# Patient Record
Sex: Female | Born: 1955 | Race: White | Hispanic: No | Marital: Married | Smoking: Never smoker
Health system: Southern US, Community
[De-identification: ages and names within clinical notes are randomized; demographics above are authoritative.]

## PROBLEM LIST (undated history)

## (undated) DIAGNOSIS — Z801 Family history of malignant neoplasm of trachea, bronchus and lung: Secondary | ICD-10-CM

## (undated) DIAGNOSIS — Z8042 Family history of malignant neoplasm of prostate: Secondary | ICD-10-CM

## (undated) DIAGNOSIS — Z923 Personal history of irradiation: Secondary | ICD-10-CM

## (undated) DIAGNOSIS — Z9221 Personal history of antineoplastic chemotherapy: Secondary | ICD-10-CM

## (undated) DIAGNOSIS — Z808 Family history of malignant neoplasm of other organs or systems: Secondary | ICD-10-CM

## (undated) HISTORY — DX: Family history of malignant neoplasm of trachea, bronchus and lung: Z80.1

## (undated) HISTORY — DX: Family history of malignant neoplasm of other organs or systems: Z80.8

## (undated) HISTORY — PX: BLADDER SURGERY: SHX569

## (undated) HISTORY — DX: Family history of malignant neoplasm of prostate: Z80.42

## (undated) HISTORY — PX: ABDOMINAL HYSTERECTOMY: SHX81

## (undated) HISTORY — PX: SHOULDER SURGERY: SHX246

---

## 2002-09-03 ENCOUNTER — Other Ambulatory Visit: Admission: RE | Admit: 2002-09-03 | Discharge: 2002-09-03 | Payer: Self-pay | Admitting: Obstetrics & Gynecology

## 2002-12-16 ENCOUNTER — Other Ambulatory Visit: Admission: RE | Admit: 2002-12-16 | Discharge: 2002-12-16 | Payer: Self-pay | Admitting: Obstetrics & Gynecology

## 2003-08-22 DIAGNOSIS — C50919 Malignant neoplasm of unspecified site of unspecified female breast: Secondary | ICD-10-CM

## 2003-08-22 HISTORY — PX: BREAST LUMPECTOMY: SHX2

## 2003-08-22 HISTORY — DX: Malignant neoplasm of unspecified site of unspecified female breast: C50.919

## 2003-11-18 ENCOUNTER — Encounter (INDEPENDENT_AMBULATORY_CARE_PROVIDER_SITE_OTHER): Payer: Self-pay | Admitting: *Deleted

## 2003-11-18 ENCOUNTER — Encounter: Admission: RE | Admit: 2003-11-18 | Discharge: 2003-11-18 | Payer: Self-pay | Admitting: Obstetrics & Gynecology

## 2003-11-25 ENCOUNTER — Encounter (HOSPITAL_COMMUNITY): Admission: RE | Admit: 2003-11-25 | Discharge: 2004-02-23 | Payer: Self-pay | Admitting: *Deleted

## 2003-12-08 ENCOUNTER — Encounter (INDEPENDENT_AMBULATORY_CARE_PROVIDER_SITE_OTHER): Payer: Self-pay | Admitting: *Deleted

## 2003-12-08 ENCOUNTER — Ambulatory Visit (HOSPITAL_COMMUNITY): Admission: RE | Admit: 2003-12-08 | Discharge: 2003-12-08 | Payer: Self-pay | Admitting: *Deleted

## 2003-12-08 ENCOUNTER — Ambulatory Visit (HOSPITAL_BASED_OUTPATIENT_CLINIC_OR_DEPARTMENT_OTHER): Admission: RE | Admit: 2003-12-08 | Discharge: 2003-12-08 | Payer: Self-pay | Admitting: *Deleted

## 2003-12-25 ENCOUNTER — Ambulatory Visit (HOSPITAL_COMMUNITY): Admission: RE | Admit: 2003-12-25 | Discharge: 2003-12-25 | Payer: Self-pay | Admitting: Oncology

## 2004-01-08 ENCOUNTER — Other Ambulatory Visit: Admission: RE | Admit: 2004-01-08 | Discharge: 2004-01-08 | Payer: Self-pay | Admitting: Obstetrics & Gynecology

## 2004-02-09 ENCOUNTER — Ambulatory Visit: Admission: RE | Admit: 2004-02-09 | Discharge: 2004-05-05 | Payer: Self-pay | Admitting: Radiation Oncology

## 2004-02-15 ENCOUNTER — Encounter: Admission: RE | Admit: 2004-02-15 | Discharge: 2004-02-15 | Payer: Self-pay | Admitting: Radiation Oncology

## 2004-05-31 ENCOUNTER — Ambulatory Visit: Admission: RE | Admit: 2004-05-31 | Discharge: 2004-05-31 | Payer: Self-pay | Admitting: Radiation Oncology

## 2004-07-02 ENCOUNTER — Ambulatory Visit: Payer: Self-pay | Admitting: Oncology

## 2004-07-04 ENCOUNTER — Other Ambulatory Visit: Admission: RE | Admit: 2004-07-04 | Discharge: 2004-07-04 | Payer: Self-pay | Admitting: Obstetrics & Gynecology

## 2004-10-18 ENCOUNTER — Ambulatory Visit: Payer: Self-pay | Admitting: Oncology

## 2004-10-19 ENCOUNTER — Ambulatory Visit: Payer: Self-pay | Admitting: Oncology

## 2004-12-19 ENCOUNTER — Other Ambulatory Visit: Admission: RE | Admit: 2004-12-19 | Discharge: 2004-12-19 | Payer: Self-pay | Admitting: Obstetrics & Gynecology

## 2004-12-29 ENCOUNTER — Ambulatory Visit: Payer: Self-pay | Admitting: Oncology

## 2005-02-15 ENCOUNTER — Encounter: Admission: RE | Admit: 2005-02-15 | Discharge: 2005-02-15 | Payer: Self-pay | Admitting: Obstetrics & Gynecology

## 2005-04-07 ENCOUNTER — Ambulatory Visit: Payer: Self-pay | Admitting: Oncology

## 2005-07-03 ENCOUNTER — Other Ambulatory Visit: Admission: RE | Admit: 2005-07-03 | Discharge: 2005-07-03 | Payer: Self-pay | Admitting: Obstetrics & Gynecology

## 2005-08-04 ENCOUNTER — Ambulatory Visit: Payer: Self-pay | Admitting: Hematology and Oncology

## 2005-11-28 ENCOUNTER — Ambulatory Visit: Payer: Self-pay | Admitting: Oncology

## 2005-11-29 LAB — CBC WITH DIFFERENTIAL/PLATELET
BASO%: 0.6 % (ref 0.0–2.0)
Basophils Absolute: 0 10*3/uL (ref 0.0–0.1)
EOS%: 1.6 % (ref 0.0–7.0)
Eosinophils Absolute: 0.1 10*3/uL (ref 0.0–0.5)
HCT: 36.9 % (ref 34.8–46.6)
HGB: 12.9 g/dL (ref 11.6–15.9)
LYMPH%: 29.3 % (ref 14.0–48.0)
MCH: 29.1 pg (ref 26.0–34.0)
MCHC: 34.8 g/dL (ref 32.0–36.0)
MCV: 83.5 fL (ref 81.0–101.0)
MONO#: 0.5 10*3/uL (ref 0.1–0.9)
MONO%: 8.7 % (ref 0.0–13.0)
NEUT#: 3.3 10*3/uL (ref 1.5–6.5)
NEUT%: 59.8 % (ref 39.6–76.8)
Platelets: 231 10*3/uL (ref 145–400)
RBC: 4.42 10*6/uL (ref 3.70–5.32)
RDW: 13.1 % (ref 11.3–14.5)
WBC: 5.4 10*3/uL (ref 3.9–10.0)
lymph#: 1.6 10*3/uL (ref 0.9–3.3)

## 2005-11-29 LAB — COMPREHENSIVE METABOLIC PANEL
ALT: 17 U/L (ref 0–40)
AST: 20 U/L (ref 0–37)
Albumin: 4.6 g/dL (ref 3.5–5.2)
Alkaline Phosphatase: 60 U/L (ref 39–117)
BUN: 10 mg/dL (ref 6–23)
CO2: 25 mEq/L (ref 19–32)
Calcium: 9.2 mg/dL (ref 8.4–10.5)
Chloride: 105 mEq/L (ref 96–112)
Creatinine, Ser: 0.8 mg/dL (ref 0.4–1.2)
Glucose, Bld: 122 mg/dL — ABNORMAL HIGH (ref 70–99)
Potassium: 3.9 mEq/L (ref 3.5–5.3)
Sodium: 139 mEq/L (ref 135–145)
Total Bilirubin: 0.6 mg/dL (ref 0.3–1.2)
Total Protein: 7.2 g/dL (ref 6.0–8.3)

## 2005-11-29 LAB — CANCER ANTIGEN 27.29: CA 27.29: 19 U/mL (ref 0–39)

## 2005-11-29 LAB — ESTRADIOL: Estradiol: 29.1 pg/mL

## 2005-11-29 LAB — FOLLICLE STIMULATING HORMONE: FSH: 19.9 m[IU]/mL

## 2005-12-01 ENCOUNTER — Ambulatory Visit (HOSPITAL_COMMUNITY): Admission: RE | Admit: 2005-12-01 | Discharge: 2005-12-01 | Payer: Self-pay | Admitting: Oncology

## 2005-12-23 ENCOUNTER — Ambulatory Visit (HOSPITAL_COMMUNITY): Admission: RE | Admit: 2005-12-23 | Discharge: 2005-12-23 | Payer: Self-pay | Admitting: Oncology

## 2006-01-24 ENCOUNTER — Ambulatory Visit: Payer: Self-pay | Admitting: Oncology

## 2006-02-18 ENCOUNTER — Emergency Department: Payer: Self-pay | Admitting: Internal Medicine

## 2006-02-18 ENCOUNTER — Other Ambulatory Visit: Payer: Self-pay

## 2006-02-26 ENCOUNTER — Encounter: Admission: RE | Admit: 2006-02-26 | Discharge: 2006-02-26 | Payer: Self-pay | Admitting: Oncology

## 2006-04-08 ENCOUNTER — Ambulatory Visit: Payer: Self-pay | Admitting: Oncology

## 2006-04-17 ENCOUNTER — Ambulatory Visit (HOSPITAL_COMMUNITY): Admission: RE | Admit: 2006-04-17 | Discharge: 2006-04-17 | Payer: Self-pay | Admitting: Oncology

## 2006-04-17 LAB — COMPREHENSIVE METABOLIC PANEL
ALT: 12 U/L (ref 0–40)
AST: 15 U/L (ref 0–37)
Albumin: 4.3 g/dL (ref 3.5–5.2)
Alkaline Phosphatase: 49 U/L (ref 39–117)
BUN: 8 mg/dL (ref 6–23)
CO2: 26 mEq/L (ref 19–32)
Calcium: 9.2 mg/dL (ref 8.4–10.5)
Chloride: 106 mEq/L (ref 96–112)
Creatinine, Ser: 0.83 mg/dL (ref 0.40–1.20)
Glucose, Bld: 97 mg/dL (ref 70–99)
Potassium: 4.2 mEq/L (ref 3.5–5.3)
Sodium: 141 mEq/L (ref 135–145)
Total Bilirubin: 0.7 mg/dL (ref 0.3–1.2)
Total Protein: 7.6 g/dL (ref 6.0–8.3)

## 2006-04-17 LAB — CBC WITH DIFFERENTIAL/PLATELET
BASO%: 0.9 % (ref 0.0–2.0)
Basophils Absolute: 0 10*3/uL (ref 0.0–0.1)
EOS%: 2 % (ref 0.0–7.0)
Eosinophils Absolute: 0.1 10*3/uL (ref 0.0–0.5)
HCT: 37.4 % (ref 34.8–46.6)
HGB: 13.2 g/dL (ref 11.6–15.9)
LYMPH%: 32.3 % (ref 14.0–48.0)
MCH: 29.9 pg (ref 26.0–34.0)
MCHC: 35.4 g/dL (ref 32.0–36.0)
MCV: 84.5 fL (ref 81.0–101.0)
MONO#: 0.3 10*3/uL (ref 0.1–0.9)
MONO%: 7.9 % (ref 0.0–13.0)
NEUT#: 2.4 10*3/uL (ref 1.5–6.5)
NEUT%: 56.9 % (ref 39.6–76.8)
Platelets: 223 10*3/uL (ref 145–400)
RBC: 4.43 10*6/uL (ref 3.70–5.32)
RDW: 12.6 % (ref 11.3–14.5)
WBC: 4.2 10*3/uL (ref 3.9–10.0)
lymph#: 1.3 10*3/uL (ref 0.9–3.3)

## 2006-04-17 LAB — CANCER ANTIGEN 27.29: CA 27.29: 18 U/mL (ref 0–39)

## 2006-04-17 LAB — AFP TUMOR MARKER: AFP-Tumor Marker: 6.6 ng/mL (ref 0.0–8.0)

## 2006-10-04 ENCOUNTER — Ambulatory Visit: Payer: Self-pay | Admitting: Oncology

## 2006-10-09 ENCOUNTER — Ambulatory Visit (HOSPITAL_COMMUNITY): Admission: RE | Admit: 2006-10-09 | Discharge: 2006-10-09 | Payer: Self-pay | Admitting: Oncology

## 2006-10-09 LAB — CBC WITH DIFFERENTIAL/PLATELET
BASO%: 0.6 % (ref 0.0–2.0)
Basophils Absolute: 0 10*3/uL (ref 0.0–0.1)
EOS%: 1.7 % (ref 0.0–7.0)
Eosinophils Absolute: 0.1 10*3/uL (ref 0.0–0.5)
HCT: 38.3 % (ref 34.8–46.6)
HGB: 13.5 g/dL (ref 11.6–15.9)
LYMPH%: 33.3 % (ref 14.0–48.0)
MCH: 29.1 pg (ref 26.0–34.0)
MCHC: 35.3 g/dL (ref 32.0–36.0)
MCV: 82.6 fL (ref 81.0–101.0)
MONO#: 0.3 10*3/uL (ref 0.1–0.9)
MONO%: 7.6 % (ref 0.0–13.0)
NEUT#: 2.6 10*3/uL (ref 1.5–6.5)
NEUT%: 56.8 % (ref 39.6–76.8)
Platelets: 216 10*3/uL (ref 145–400)
RBC: 4.64 10*6/uL (ref 3.70–5.32)
RDW: 12.9 % (ref 11.3–14.5)
WBC: 4.5 10*3/uL (ref 3.9–10.0)
lymph#: 1.5 10*3/uL (ref 0.9–3.3)

## 2006-10-09 LAB — COMPREHENSIVE METABOLIC PANEL
ALT: 13 U/L (ref 0–35)
AST: 17 U/L (ref 0–37)
Albumin: 4.6 g/dL (ref 3.5–5.2)
Alkaline Phosphatase: 56 U/L (ref 39–117)
BUN: 9 mg/dL (ref 6–23)
CO2: 22 mEq/L (ref 19–32)
Calcium: 9.7 mg/dL (ref 8.4–10.5)
Chloride: 107 mEq/L (ref 96–112)
Creatinine, Ser: 0.77 mg/dL (ref 0.40–1.20)
Glucose, Bld: 102 mg/dL — ABNORMAL HIGH (ref 70–99)
Potassium: 3.6 mEq/L (ref 3.5–5.3)
Sodium: 144 mEq/L (ref 135–145)
Total Bilirubin: 0.8 mg/dL (ref 0.3–1.2)
Total Protein: 7.9 g/dL (ref 6.0–8.3)

## 2006-10-09 LAB — CANCER ANTIGEN 27.29: CA 27.29: 20 U/mL (ref 0–39)

## 2006-10-09 LAB — FOLLICLE STIMULATING HORMONE: FSH: 16.7 m[IU]/mL

## 2006-10-09 LAB — LACTATE DEHYDROGENASE: LDH: 148 U/L (ref 94–250)

## 2006-10-09 LAB — AFP TUMOR MARKER: AFP-Tumor Marker: 7.4 ng/mL (ref 0.0–8.0)

## 2006-10-22 LAB — ESTRADIOL, ULTRA SENS: Estradiol, Ultra Sensitive: <2 pg/mL

## 2007-02-26 ENCOUNTER — Encounter: Admission: RE | Admit: 2007-02-26 | Discharge: 2007-02-26 | Payer: Self-pay | Admitting: *Deleted

## 2007-04-05 ENCOUNTER — Ambulatory Visit: Payer: Self-pay | Admitting: Oncology

## 2007-04-09 LAB — CBC WITH DIFFERENTIAL/PLATELET
BASO%: 0.7 % (ref 0.0–2.0)
Basophils Absolute: 0 10*3/uL (ref 0.0–0.1)
EOS%: 1.8 % (ref 0.0–7.0)
Eosinophils Absolute: 0.1 10*3/uL (ref 0.0–0.5)
HCT: 37.3 % (ref 34.8–46.6)
HGB: 13.3 g/dL (ref 11.6–15.9)
LYMPH%: 28 % (ref 14.0–48.0)
MCH: 29.6 pg (ref 26.0–34.0)
MCHC: 35.6 g/dL (ref 32.0–36.0)
MCV: 83.2 fL (ref 81.0–101.0)
MONO#: 0.4 10*3/uL (ref 0.1–0.9)
MONO%: 8.2 % (ref 0.0–13.0)
NEUT#: 3.3 10*3/uL (ref 1.5–6.5)
NEUT%: 61.3 % (ref 39.6–76.8)
Platelets: 211 10*3/uL (ref 145–400)
RBC: 4.49 10*6/uL (ref 3.70–5.32)
RDW: 12.6 % (ref 11.3–14.5)
WBC: 5.4 10*3/uL (ref 3.9–10.0)
lymph#: 1.5 10*3/uL (ref 0.9–3.3)

## 2007-04-09 LAB — COMPREHENSIVE METABOLIC PANEL
ALT: 12 U/L (ref 0–35)
AST: 16 U/L (ref 0–37)
Albumin: 4.5 g/dL (ref 3.5–5.2)
Alkaline Phosphatase: 45 U/L (ref 39–117)
BUN: 12 mg/dL (ref 6–23)
CO2: 25 mEq/L (ref 19–32)
Calcium: 9.8 mg/dL (ref 8.4–10.5)
Chloride: 105 mEq/L (ref 96–112)
Creatinine, Ser: 0.8 mg/dL (ref 0.40–1.20)
Glucose, Bld: 100 mg/dL — ABNORMAL HIGH (ref 70–99)
Potassium: 3.7 mEq/L (ref 3.5–5.3)
Sodium: 141 mEq/L (ref 135–145)
Total Bilirubin: 0.7 mg/dL (ref 0.3–1.2)
Total Protein: 7.4 g/dL (ref 6.0–8.3)

## 2007-04-09 LAB — CANCER ANTIGEN 27.29: CA 27.29: 18 U/mL (ref 0–39)

## 2007-04-09 LAB — AFP TUMOR MARKER: AFP-Tumor Marker: 7.3 ng/mL (ref 0.0–8.0)

## 2007-10-04 ENCOUNTER — Ambulatory Visit: Payer: Self-pay | Admitting: Oncology

## 2007-10-08 LAB — CBC WITH DIFFERENTIAL/PLATELET
BASO%: 0.5 % (ref 0.0–2.0)
Basophils Absolute: 0 10*3/uL (ref 0.0–0.1)
EOS%: 1.7 % (ref 0.0–7.0)
Eosinophils Absolute: 0.1 10*3/uL (ref 0.0–0.5)
HCT: 41.6 % (ref 34.8–46.6)
HGB: 13.6 g/dL (ref 11.6–15.9)
LYMPH%: 20.2 % (ref 14.0–48.0)
MCH: 26.9 pg (ref 26.0–34.0)
MCHC: 32.7 g/dL (ref 32.0–36.0)
MCV: 82.2 fL (ref 81.0–101.0)
MONO#: 0.5 10*3/uL (ref 0.1–0.9)
MONO%: 6.3 % (ref 0.0–13.0)
NEUT#: 5.1 10*3/uL (ref 1.5–6.5)
NEUT%: 71.3 % (ref 39.6–76.8)
Platelets: 217 10*3/uL (ref 145–400)
RBC: 5.06 10*6/uL (ref 3.70–5.32)
RDW: 13.3 % (ref 11.3–14.5)
WBC: 7.2 10*3/uL (ref 3.9–10.0)
lymph#: 1.5 10*3/uL (ref 0.9–3.3)

## 2007-10-08 LAB — COMPREHENSIVE METABOLIC PANEL
ALT: 12 U/L (ref 0–35)
AST: 13 U/L (ref 0–37)
Albumin: 4.4 g/dL (ref 3.5–5.2)
Alkaline Phosphatase: 55 U/L (ref 39–117)
BUN: 11 mg/dL (ref 6–23)
CO2: 25 mEq/L (ref 19–32)
Calcium: 9.6 mg/dL (ref 8.4–10.5)
Chloride: 107 mEq/L (ref 96–112)
Creatinine, Ser: 0.72 mg/dL (ref 0.40–1.20)
Glucose, Bld: 103 mg/dL — ABNORMAL HIGH (ref 70–99)
Potassium: 4 mEq/L (ref 3.5–5.3)
Sodium: 142 mEq/L (ref 135–145)
Total Bilirubin: 1.1 mg/dL (ref 0.3–1.2)
Total Protein: 7.6 g/dL (ref 6.0–8.3)

## 2007-10-08 LAB — CANCER ANTIGEN 27.29: CA 27.29: 19 U/mL (ref 0–39)

## 2008-02-03 ENCOUNTER — Encounter (INDEPENDENT_AMBULATORY_CARE_PROVIDER_SITE_OTHER): Payer: Self-pay | Admitting: Obstetrics & Gynecology

## 2008-02-03 ENCOUNTER — Inpatient Hospital Stay (HOSPITAL_COMMUNITY): Admission: RE | Admit: 2008-02-03 | Discharge: 2008-02-07 | Payer: Self-pay | Admitting: Obstetrics & Gynecology

## 2008-02-27 ENCOUNTER — Encounter: Admission: RE | Admit: 2008-02-27 | Discharge: 2008-02-27 | Payer: Self-pay | Admitting: Obstetrics & Gynecology

## 2008-02-28 ENCOUNTER — Inpatient Hospital Stay (HOSPITAL_COMMUNITY): Admission: AD | Admit: 2008-02-28 | Discharge: 2008-03-02 | Payer: Self-pay | Admitting: Urology

## 2008-04-02 ENCOUNTER — Ambulatory Visit: Payer: Self-pay | Admitting: Oncology

## 2008-04-17 ENCOUNTER — Ambulatory Visit (HOSPITAL_BASED_OUTPATIENT_CLINIC_OR_DEPARTMENT_OTHER): Admission: RE | Admit: 2008-04-17 | Discharge: 2008-04-17 | Payer: Self-pay | Admitting: Urology

## 2008-06-10 ENCOUNTER — Ambulatory Visit: Payer: Self-pay | Admitting: Oncology

## 2008-06-12 LAB — COMPREHENSIVE METABOLIC PANEL
ALT: 10 U/L (ref 0–35)
AST: 13 U/L (ref 0–37)
Albumin: 4.4 g/dL (ref 3.5–5.2)
Alkaline Phosphatase: 55 U/L (ref 39–117)
BUN: 12 mg/dL (ref 6–23)
CO2: 22 mEq/L (ref 19–32)
Calcium: 9.1 mg/dL (ref 8.4–10.5)
Chloride: 106 mEq/L (ref 96–112)
Creatinine, Ser: 0.88 mg/dL (ref 0.40–1.20)
Glucose, Bld: 149 mg/dL — ABNORMAL HIGH (ref 70–99)
Potassium: 4 mEq/L (ref 3.5–5.3)
Sodium: 141 mEq/L (ref 135–145)
Total Bilirubin: 0.4 mg/dL (ref 0.3–1.2)
Total Protein: 7.1 g/dL (ref 6.0–8.3)

## 2008-06-12 LAB — CBC WITH DIFFERENTIAL/PLATELET
BASO%: 0.5 % (ref 0.0–2.0)
Basophils Absolute: 0 10*3/uL (ref 0.0–0.1)
EOS%: 1.8 % (ref 0.0–7.0)
Eosinophils Absolute: 0.1 10*3/uL (ref 0.0–0.5)
HCT: 36 % (ref 34.8–46.6)
HGB: 12.5 g/dL (ref 11.6–15.9)
LYMPH%: 34.2 % (ref 14.0–48.0)
MCH: 28.6 pg (ref 26.0–34.0)
MCHC: 34.7 g/dL (ref 32.0–36.0)
MCV: 82.4 fL (ref 81.0–101.0)
MONO#: 0.5 10*3/uL (ref 0.1–0.9)
MONO%: 7.9 % (ref 0.0–13.0)
NEUT#: 3.3 10*3/uL (ref 1.5–6.5)
NEUT%: 55.6 % (ref 39.6–76.8)
Platelets: 224 10*3/uL (ref 145–400)
RBC: 4.37 10*6/uL (ref 3.70–5.32)
RDW: 13.2 % (ref 11.3–14.5)
WBC: 5.9 10*3/uL (ref 3.9–10.0)
lymph#: 2 10*3/uL (ref 0.9–3.3)

## 2008-06-12 LAB — CANCER ANTIGEN 27.29: CA 27.29: 17 U/mL (ref 0–39)

## 2008-10-05 ENCOUNTER — Ambulatory Visit: Payer: Self-pay | Admitting: Oncology

## 2008-10-07 LAB — COMPREHENSIVE METABOLIC PANEL
ALT: 11 U/L (ref 0–35)
AST: 15 U/L (ref 0–37)
Albumin: 4.5 g/dL (ref 3.5–5.2)
Alkaline Phosphatase: 56 U/L (ref 39–117)
BUN: 11 mg/dL (ref 6–23)
CO2: 23 mEq/L (ref 19–32)
Calcium: 9.8 mg/dL (ref 8.4–10.5)
Chloride: 105 mEq/L (ref 96–112)
Creatinine, Ser: 0.72 mg/dL (ref 0.40–1.20)
Glucose, Bld: 122 mg/dL — ABNORMAL HIGH (ref 70–99)
Potassium: 3.9 mEq/L (ref 3.5–5.3)
Sodium: 140 mEq/L (ref 135–145)
Total Bilirubin: 0.5 mg/dL (ref 0.3–1.2)
Total Protein: 7.7 g/dL (ref 6.0–8.3)

## 2008-10-07 LAB — CANCER ANTIGEN 27.29: CA 27.29: 15 U/mL (ref 0–39)

## 2009-03-26 ENCOUNTER — Encounter: Admission: RE | Admit: 2009-03-26 | Discharge: 2009-03-26 | Payer: Self-pay | Admitting: Oncology

## 2009-04-22 ENCOUNTER — Ambulatory Visit: Payer: Self-pay | Admitting: Oncology

## 2009-04-27 LAB — CBC WITH DIFFERENTIAL/PLATELET
BASO%: 0.5 % (ref 0.0–2.0)
Basophils Absolute: 0 10*3/uL (ref 0.0–0.1)
EOS%: 1.8 % (ref 0.0–7.0)
Eosinophils Absolute: 0.1 10*3/uL (ref 0.0–0.5)
HCT: 38.2 % (ref 34.8–46.6)
HGB: 13.3 g/dL (ref 11.6–15.9)
LYMPH%: 35 % (ref 14.0–49.7)
MCH: 29.3 pg (ref 25.1–34.0)
MCHC: 34.7 g/dL (ref 31.5–36.0)
MCV: 84.3 fL (ref 79.5–101.0)
MONO#: 0.3 10*3/uL (ref 0.1–0.9)
MONO%: 6.6 % (ref 0.0–14.0)
NEUT#: 3 10*3/uL (ref 1.5–6.5)
NEUT%: 56.1 % (ref 38.4–76.8)
Platelets: 213 10*3/uL (ref 145–400)
RBC: 4.53 10*6/uL (ref 3.70–5.45)
RDW: 12.6 % (ref 11.2–14.5)
WBC: 5.3 10*3/uL (ref 3.9–10.3)
lymph#: 1.8 10*3/uL (ref 0.9–3.3)

## 2009-04-27 LAB — COMPREHENSIVE METABOLIC PANEL
ALT: 11 U/L (ref 0–35)
AST: 13 U/L (ref 0–37)
Albumin: 4.3 g/dL (ref 3.5–5.2)
Alkaline Phosphatase: 47 U/L (ref 39–117)
BUN: 11 mg/dL (ref 6–23)
CO2: 21 mEq/L (ref 19–32)
Calcium: 9.5 mg/dL (ref 8.4–10.5)
Chloride: 106 mEq/L (ref 96–112)
Creatinine, Ser: 0.72 mg/dL (ref 0.40–1.20)
Glucose, Bld: 92 mg/dL (ref 70–99)
Potassium: 4 mEq/L (ref 3.5–5.3)
Sodium: 143 mEq/L (ref 135–145)
Total Bilirubin: 0.6 mg/dL (ref 0.3–1.2)
Total Protein: 7.5 g/dL (ref 6.0–8.3)

## 2009-10-21 ENCOUNTER — Ambulatory Visit: Payer: Self-pay | Admitting: Oncology

## 2009-10-25 LAB — CBC WITH DIFFERENTIAL/PLATELET
BASO%: 0.6 % (ref 0.0–2.0)
Basophils Absolute: 0 10*3/uL (ref 0.0–0.1)
EOS%: 2.4 % (ref 0.0–7.0)
Eosinophils Absolute: 0.1 10*3/uL (ref 0.0–0.5)
HCT: 37.9 % (ref 34.8–46.6)
HGB: 13.1 g/dL (ref 11.6–15.9)
LYMPH%: 31 % (ref 14.0–49.7)
MCH: 29.5 pg (ref 25.1–34.0)
MCHC: 34.5 g/dL (ref 31.5–36.0)
MCV: 85.5 fL (ref 79.5–101.0)
MONO#: 0.5 10*3/uL (ref 0.1–0.9)
MONO%: 8.2 % (ref 0.0–14.0)
NEUT#: 3.3 10*3/uL (ref 1.5–6.5)
NEUT%: 57.8 % (ref 38.4–76.8)
Platelets: 215 10*3/uL (ref 145–400)
RBC: 4.44 10*6/uL (ref 3.70–5.45)
RDW: 13.1 % (ref 11.2–14.5)
WBC: 5.7 10*3/uL (ref 3.9–10.3)
lymph#: 1.8 10*3/uL (ref 0.9–3.3)

## 2009-10-25 LAB — COMPREHENSIVE METABOLIC PANEL
ALT: 11 U/L (ref 0–35)
AST: 14 U/L (ref 0–37)
Albumin: 4.2 g/dL (ref 3.5–5.2)
Alkaline Phosphatase: 54 U/L (ref 39–117)
BUN: 12 mg/dL (ref 6–23)
CO2: 25 mEq/L (ref 19–32)
Calcium: 9.2 mg/dL (ref 8.4–10.5)
Chloride: 105 mEq/L (ref 96–112)
Creatinine, Ser: 0.71 mg/dL (ref 0.40–1.20)
Glucose, Bld: 117 mg/dL — ABNORMAL HIGH (ref 70–99)
Potassium: 4 mEq/L (ref 3.5–5.3)
Sodium: 140 mEq/L (ref 135–145)
Total Bilirubin: 0.6 mg/dL (ref 0.3–1.2)
Total Protein: 7.3 g/dL (ref 6.0–8.3)

## 2009-12-23 ENCOUNTER — Ambulatory Visit: Payer: Self-pay | Admitting: Oncology

## 2009-12-24 LAB — CBC WITH DIFFERENTIAL/PLATELET
BASO%: 0.6 % (ref 0.0–2.0)
Basophils Absolute: 0 10*3/uL (ref 0.0–0.1)
EOS%: 2.3 % (ref 0.0–7.0)
Eosinophils Absolute: 0.1 10*3/uL (ref 0.0–0.5)
HCT: 40.4 % (ref 34.8–46.6)
HGB: 13.9 g/dL (ref 11.6–15.9)
LYMPH%: 32.2 % (ref 14.0–49.7)
MCH: 29.3 pg (ref 25.1–34.0)
MCHC: 34.3 g/dL (ref 31.5–36.0)
MCV: 85.3 fL (ref 79.5–101.0)
MONO#: 0.4 10*3/uL (ref 0.1–0.9)
MONO%: 6.4 % (ref 0.0–14.0)
NEUT#: 3.8 10*3/uL (ref 1.5–6.5)
NEUT%: 58.5 % (ref 38.4–76.8)
Platelets: 229 10*3/uL (ref 145–400)
RBC: 4.73 10*6/uL (ref 3.70–5.45)
RDW: 13.7 % (ref 11.2–14.5)
WBC: 6.5 10*3/uL (ref 3.9–10.3)
lymph#: 2.1 10*3/uL (ref 0.9–3.3)

## 2009-12-24 LAB — HEMOGLOBIN A1C
Hgb A1c MFr Bld: 5.3 % (ref <5.7)
Mean Plasma Glucose: 105 mg/dL (ref <117)

## 2010-04-11 ENCOUNTER — Encounter: Admission: RE | Admit: 2010-04-11 | Discharge: 2010-04-11 | Payer: Self-pay | Admitting: Oncology

## 2010-06-30 ENCOUNTER — Ambulatory Visit: Payer: Self-pay | Admitting: Oncology

## 2010-07-04 LAB — COMPREHENSIVE METABOLIC PANEL
ALT: 20 U/L (ref 0–35)
AST: 18 U/L (ref 0–37)
Albumin: 4.5 g/dL (ref 3.5–5.2)
Alkaline Phosphatase: 59 U/L (ref 39–117)
BUN: 13 mg/dL (ref 6–23)
CO2: 29 mEq/L (ref 19–32)
Calcium: 10.1 mg/dL (ref 8.4–10.5)
Chloride: 103 mEq/L (ref 96–112)
Creatinine, Ser: 0.77 mg/dL (ref 0.40–1.20)
Glucose, Bld: 93 mg/dL (ref 70–99)
Potassium: 4.3 mEq/L (ref 3.5–5.3)
Sodium: 141 mEq/L (ref 135–145)
Total Bilirubin: 0.8 mg/dL (ref 0.3–1.2)
Total Protein: 7.6 g/dL (ref 6.0–8.3)

## 2010-07-04 LAB — CBC WITH DIFFERENTIAL/PLATELET
BASO%: 0.5 % (ref 0.0–2.0)
Basophils Absolute: 0 10*3/uL (ref 0.0–0.1)
EOS%: 0.9 % (ref 0.0–7.0)
Eosinophils Absolute: 0.1 10*3/uL (ref 0.0–0.5)
HCT: 43.8 % (ref 34.8–46.6)
HGB: 15 g/dL (ref 11.6–15.9)
LYMPH%: 26.3 % (ref 14.0–49.7)
MCH: 30.1 pg (ref 25.1–34.0)
MCHC: 34.3 g/dL (ref 31.5–36.0)
MCV: 87.8 fL (ref 79.5–101.0)
MONO#: 0.8 10*3/uL (ref 0.1–0.9)
MONO%: 8.8 % (ref 0.0–14.0)
NEUT#: 5.5 10*3/uL (ref 1.5–6.5)
NEUT%: 63.5 % (ref 38.4–76.8)
Platelets: 225 10*3/uL (ref 145–400)
RBC: 4.98 10*6/uL (ref 3.70–5.45)
RDW: 14.4 % (ref 11.2–14.5)
WBC: 8.7 10*3/uL (ref 3.9–10.3)
lymph#: 2.3 10*3/uL (ref 0.9–3.3)

## 2010-07-04 LAB — CANCER ANTIGEN 27.29: CA 27.29: 17 U/mL (ref 0–39)

## 2010-09-11 ENCOUNTER — Encounter: Payer: Self-pay | Admitting: Oncology

## 2010-09-12 ENCOUNTER — Encounter: Payer: Self-pay | Admitting: Obstetrics & Gynecology

## 2011-01-03 NOTE — H&P (Signed)
NAME:  Christine Mosley, Christine Mosley                ACCOUNT NO.:  [id number]   MEDICAL RECORD NO.:  [id number]          PATIENT TYPE:  AMB   LOCATION:  SDC                           FACILITY:  [facility]   PHYSICIAN:  Freddy Finner, M.D.   DATE OF BIRTH:  1956-02-16   DATE OF ADMISSION:  02/03/2008  DATE OF DISCHARGE:                              HISTORY & PHYSICAL   ADMITTING DIAGNOSES:  1. Third-degree cervical prolapse.  2. Symptomatic second-degree cystocele, second-degree rectocele.  3. History of breast cancer, now on tamoxifen.   HISTORY:  The patient is a 55 year old gravida 3, para 2, white married  female, who has had symptomatic pelvic relaxation symptoms since the  birth of her first child.  In the recent past, she presented complaining  of something protruding from the vagina, which on physical examination  was found to be the cervix.  She is currently on tamoxifen for  previously treated right breast cancer, estrogen-receptor positive.  Based on the constellation of symptoms and her medical situation, she  has requested surgical treatment and is admitted now for laparoscopic-  assisted vaginal hysterectomy, bilateral salpingo-oophorectomy,  anterior, and posterior vaginal repair, possible sacrospinous ligament,  and suspension of vagina.   With the exception of her GU complaints, her review of systems is  currently negative.  There are no cardiopulmonary, GI, or GU complaints.   PAST MEDICAL HISTORY:  Essentially remarkable as above.  She has no  other chronic medical illnesses.   PREVIOUS SURGICAL TREATMENTS:  1. Left salpingectomy for ectopic pregnancy in 1995.  2. She has had 3 vaginal deliveries; vaginal delivery and a cesarean      delivery with her second pregnancy.  3. She had conservative surgery for breast cancer on the right in      2004.   ALLERGIES:  Her only known allergies to CODEINE, which makes her sick  and has emesis.   MEDICATIONS:  Her only medications are  vitamin D and tamoxifen.   SOCIAL HISTORY:  She is not a cigarette smoker.  She has never required  a blood transfusion.   FAMILY HISTORY:  Noncontributory.   PHYSICAL EXAMINATION:  HEENT:  Grossly within normal limits.  VITAL SIGNS:  Blood pressure in the office 110/70.  NECK:  Thyroid gland is not palpably enlarged to my exam.  CHEST:  Clear to auscultation throughout.  HEART:  Normal sinus rhythm without murmurs, rubs, or gallops.  BREASTS:  Remarkable for scarring of the right breast in the upper outer  quadrant at the 11 o'clock position.  She has no dominant mass, skin  change, nipple retraction, or nipple discharge on either breast.  ABDOMEN:  Soft and nontender without appreciable organomegaly or  palpable masses.  PELVIC:  Relaxation of the vaginal outlet.  Cervix protrudes through the  vaginal introitus with Valsalva.  There is a second-degree cystocele and  second-degree rectocele.  Bimanual reveals the uterus to be anterior in  position, not palpably enlarged, and no palpable adnexal masses.  Rectovaginal exam confirms the above findings and no palpable rectal  lesions.  EXTREMITIES:  Without  cyanosis, clubbing, or edema.   ASSESSMENT:  Uterine prolapse, cystocele, rectocele, history of breast  cancer, and request for definitive surgery.   PLAN:  Laparoscopically assisted vaginal hysterectomy, bilateral  salpingo-oophorectomy, anterior and posterior vaginal repair, possible  sacrospinous ligament, and suspension of vagina.  Potential risks of the  procedure including risk to other organs including bladder and bowel,  risk of vascular injury, and risk of infection have been discussed with  the patient.  The possibility of converting the procedure to an  abdominal approach has also been discussed.  Prophylactic measures will  be used to reduce the risk of problems including serial compression hose  and IV antibiotics.  The patient is admitted now and prepared to  proceed  with surgery.       Freddy Finner, M.D.  Electronically Signed     WRN/MEDQ  D:  02/01/2008  T:  02/01/2008  Job:  [id number]

## 2011-01-03 NOTE — Op Note (Signed)
NAME:  Christine Mosley, Christine Mosley                ACCOUNT NO.:  [id number]   MEDICAL RECORD NO.:  [id number]          PATIENT TYPE:  AMB   LOCATION:  N[facility]                         FACILITY:  [facility]   PHYSICIAN:  Ronald L. Earlene Plater, M.D.  DATE OF BIRTH:  1955/12/30   DATE OF PROCEDURE:  04/17/2008  DATE OF DISCHARGE:                               OPERATIVE REPORT   DIAGNOSIS:  Left ureteral obstruction.   OPERATIVE PROCEDURE:  Cystourethroscopy, removal and replacement of left  double-J stent, left retrograde pyelogram, balloon dilatation of left  ureteral obstruction.   SURGEON:  Gaynelle Arabian, MD.   ANESTHESIA:  LMA.   BLOOD LOSS:  Negligible.   TUBES:  A 24-cm 8-French contoured double pigtail stent.   COMPLICATIONS:  None.   INDICATIONS FOR PROCEDURE:  Ms. Dillow is a lovely 55 year old white  female who was status post laparoscopic-assisted total abdominal  hysterectomy and bilateral salpingo-oophorectomy and anterior posterior  repair.  Approximately 3 weeks status post procedure she developed  intermittent flank pain and nausea which progressed.  She was  subsequently underwent an ultrasound that revealed a left hydronephrosis  and CT scan revealed obstruction in the lower ureter consistent with  obstruction from surgery.  She subsequently underwent a left  percutaneous nephrostomy and placement of a left antegrade double-J  stent.  This was placed in mid July.  The stent has been left in place  and now she is for cysto, balloon dilatation, replacement of the stent  hopefully to minimally invasively allow the ureter to open.  After  discussing the risks, benefits and alternatives she has elected to  proceed.   PROCEDURE IN DETAIL:  The patient was placed in the supine position.  After proper LMA anesthesia, was placed in the dorsal lithotomy  position, prepped and draped in a sterile fashion.  Cystourethroscopy  was performed with a 22.5-French Olympus panendoscope.  The bladder was  smooth-walled and was noted to be without lesions.  The end of the stent  was grasped and pulled to the meatus.  Under fluoroscopic guidance a  0.03-French sensor wire was placed into the left renal pelvis and the  stent was removed.  Retrograde pyelogram was then performed with a 6-  [place] open-ended catheter, then placed over the wire and some narrowing  was noted in the distal ureter, although no extravasation was noted.  The inner collecting system appeared to be essentially normal.  Under  fluoroscopic guidance, the area of the obstruction was then balloon  dilated with a 4-mm 10-cm balloon dilator to 10 atmospheres of pressure  for approximately 2 minutes.  It appeared to dilate very well and there  was no wasting or obstruction noted.  Following this, the balloon  dilator was removed and under fluoroscopic guidance a new 24-cm 8-French  contoured double pigtail stent was placed and noted to be in good  position within the left renal pelvis and within the  bladder.  This was confirmed fluoroscopically.  The bladder was drained,  the panendoscope was removed, and the patient was taken to the recovery  room stable.  The plan  is to leave the stent in for 2 weeks and then  remove it to make sure the obstruction does not recur.      Ronald L. Earlene Plater, M.D.  Electronically Signed     RLD/MEDQ  D:  04/17/2008  T:  04/17/2008  Job:  [id number]   cc:   Freddy Finner, M.D.  Fax: [phone]

## 2011-01-03 NOTE — Op Note (Signed)
NAME:  Christine Mosley, Christine Mosley                ACCOUNT NO.:  [id number]   MEDICAL RECORD NO.:  [id number]          PATIENT TYPE:  INP   LOCATION:  9304                          FACILITY:  [facility]   PHYSICIAN:  Freddy Finner, M.D.   DATE OF BIRTH:  [birth year]   DATE OF PROCEDURE:  02/03/2008  DATE OF DISCHARGE:                               OPERATIVE REPORT   PREOPERATIVE DIAGNOSES:  1. Second-degree cystocele.  2. Second-degree rectocele.  3. Cervical prolapse.  4. Recent history of breast cancer.  5. Estrogen-receptor positive.   POSTOPERATIVE DIAGNOSES:  1. Second-degree cystocele.  2. Second-degree rectocele.  3. Cervical prolapse.  4. Recent history of breast cancer.  5. Estrogen-receptor positive.  6. Evidence of small fascial defects on laparoscopy with omentum stuck      to the anterior abdominal wall.  This was slightly to the left of      midline and approximately 8 cm from the symphysis.   OPERATIVE PROCEDURE:  1. Laparoscopic-assisted vaginal hysterectomy.  2. Bilateral salpingo-oophorectomy.  3. Lysis of omental adhesion.  4. Anterior and posterior vaginal repair.   ANESTHESIA:  General endotracheal.   ESTIMATED INTRAOPERATIVE BLOOD LOSS:  450 mL.   INTRAOPERATIVE COMPLICATIONS:  None.   INDICATIONS FOR PROCEDURE:  The patient is a 55 year old white married  female who has been recently treated for breast cancer.  She is  currently on tamoxifen for adjuvant therapy.  She has a symptomatic  cystocele, rectocele, and cervical prolapse.  She is admitted now for  surgery for essentially all of the above indications.   DESCRIPTION OF PROCEDURE:  She was admitted on the morning of surgery.  She was given a bolus of Ancef IV.  She was placed in PAS hose.  She was  brought to the operating room, placed under adequate general  endotracheal anesthesia, and placed in dorsal lithotomy position using  the [place] stirrup system.  Betadine prep of abdomen, perineum, and  vagina was  carried out with Betadine scrub, followed by Betadine  solution.  Bladder was evacuated with a Robinson catheter.  Hulka  tenaculum was attached to the cervix under direct visualization.  Sterile drapes were applied.  Two small incisions were made, one just  above the symphysis in the midline, one at the umbilicus through an old  scar.  An 11-mm bladed disposable trocar was introduced to the umbilicus  while elevating the anterior abdominal wall manually.  Direct inspection  revealed adequate placement with no evidence of injury on entry.  Pneumoperitoneum was allowed to accumulate with carbon dioxide gas.  The  omental adhesion was noted as recorded above.  Please note that  photographs were made before and after the operative procedure of pelvic  and abdominal contents.  The appendix was visualized and was normal.  The liver appeared to be normal.  The uterus itself was perhaps slightly  enlarged and a little boggy, but otherwise normal.  The tubes and  ovaries were normal.  A 5-mm trocar was placed through the lower  incision under direct visualization.  Through it, a blunt probe and  grasping  forceps was used.  The right infundibulopelvic ligament was  then developed, sealed, and divided using the Gyrus Tripolar device,  while placing traction on the adnexa with a ring forceps.  The  dissection was carried down through the broad ligament and round  ligament to the level just above the uterine artery.  Left side was  treated identically.  Some small amount of bleeding was encountered with  the last pedicle on the left.  This was easily controlled with bipolar  fulguration.  Attention was then turned vaginally.  Posterior weighted  vaginal retractor was placed.  Deaver retractors were used anteriorly  and laterally for exposure.  The cervix was grasped with a Christella Hartigan  tenaculum.  After removing the Hulka, colpotomy incision was made  posterior to the cervix while tenting the mucosa with  an Allis.  Cervix  was circumscribed with a scalpel and the mucosa released from the  cervix.  The LigaSure device was then used to seal and divide the  uterosacrals and bladder pillars on each side.  Bladder was further  advanced off the cervix.  Please note that a peritoneal incision was  made before completing the laparoscopic portion of the procedure  releasing the bladder from the lower segment.  The anterior peritoneum  was entered.  The LigaSure device was then used to seal and divide the  cardinal ligament pedicles and the uterine artery pedicles.  This  allowed delivery of the uterus, tubes, and ovaries through the vaginal  introitus.  Angles of vagina were anchored to uterosacrals with mattress  sutures of 0 Monocryl.  A bleeding source on the left side on the  uterosacral was fulgurated with a Bovie.  This was a small arterial  source, and this controlled the bleeding adequately.  Uterosacrals were  then plicated and posterior peritoneum closed with an interrupted 0  Monocryl suture.  Cuff was closed vertically and the posterior part of  the cuff from the uterosacrals inferiorly was closed with a figure-of-  eight suture of Monocryl.  The edges of the anterior mucosa were grasped  with Allis.  The mucosa was tented in the midline with an Allis, and  incision was made along the midline of the vagina with blunt and sharp  dissection to free the bladder from the mucosa before the incision.  Careful blunt and sharp dissection was used to free the bladder and  urethra from the urogenital fascia.  Plication sutures of 0 Monocryl  were then used to elevate urethrovesical angle and reduce the cystocele.  A bleeding source along the left sidewall or along the left margin of  the incision was controlled with Bovie and with figure-of-eight of 0  Monocryl.  Segments of mucosa were excised.  The cuff was completely  closed using a figure-of-eight of 0 Monocryl.  The remaining incision  was  closed with a running 2-0 Monocryl suture.  Attention was then  turned posteriorly.  The Allis was used to grasp the introitus at the  fourchette.  Parameters showed a small segment of skin, was excised from  the perineal body with the apex inferiorly.  The mucosa overlying the  rectum was tented and dissected away from the rectum with sharp and  blunt dissection.  A small incision made along the posterior wall.  The  rectocele was relatively small.  Perirectal tissue was approximated in  the midline with interrupted 0 Monocryl suture.  The levators were  elevated and closed using an interrupted 0 Monocryl suture.  No mucosa  was excised posteriorly.  The vaginal defect and the peritoneum were  then closed with a running 2-0 Monocryl in a fashion similar to  episiotomy.  The bladder was then filled with 250 mL of irrigating  solution.  Attempts to place the suprapubic catheter were unsuccessful.  For that reason, it was elected to fill the bladder further and observe  laparoscopically.  This allowed easy placement of the catheter without  entry into the abdominal cavity.  The banana suprapubic was anchored to  the skin with a nylon 0 chromic interrupted suture on each side.  The  [place] irrigation system was then used to irrigate and evaluate the  pelvis.  All pelvic pedicles were dry.  The irrigating solution was  aspirated from the abdomen.  The hemostasis was confirmed under reduced  intra-  abdominal pressure.  Gas was allowed to escape from the abdomen.  The  skin incisions were closed with interrupted subcuticular sutures of 3-0  Dexon.  Marcaine 0.25% was injected into the incision sites for  postoperative analgesia.  The patient was awakened, and taken to the  recovery room in good condition.      Freddy Finner, M.D.  Electronically Signed     WRN/MEDQ  D:  02/03/2008  T:  02/04/2008  Job:  [id number]

## 2011-01-03 NOTE — H&P (Signed)
NAME:  Christine Mosley, Christine Mosley                ACCOUNT NO.:  [id number]   MEDICAL RECORD NO.:  [id number]          PATIENT TYPE:  INP   LOCATION:  1440                         FACILITY:  [facility]   PHYSICIAN:  Ronald L. Earlene Plater, M.D.  DATE OF BIRTH:  [birth year]   DATE OF ADMISSION:  02/28/2008  DATE OF DISCHARGE:  03/02/2008                              HISTORY & PHYSICAL   HISTORY OF PRESENT ILLNESS:  Ms. Selph is a lovely 55 year old white  female who is approximately 3 weeks status post laparoscopic assisted  TAH and anterior and posterior repair by Dr. Jennette Kettle.  Postoperatively she  did relatively well but noted intermittent left flank pain.  She was  seen by Dr. Jennette Kettle last evening with nausea and left flank pain and was  found to have a left hydronephrosis on ultrasound.  A CT scan without  the contrast today noted a left hydronephrosis with a confined urinoma.  The ureteronephrosis extended to the left pelvis.   PAST MEDICAL HISTORY:  Breast cancer.   PAST SURGICAL HISTORY:  1. Left salpingectomy for ectopic pregnancy in 1995.  2. She had conservative surgery for breast cancer on the right in      2004.   ALLERGIES:  CODEINE.   SOCIAL HISTORY:  Negative tobacco use.   MEDICATIONS:  Vitamin D, tamoxifen.   FAMILY HISTORY:  Noncontributory.   REVIEW OF SYSTEMS:  Left flank pain, nausea, vomiting.   PHYSICAL EXAMINATION:  A 55 year old white female in no acute distress.  ABDOMEN:  Mildly tender to left flank and abdomen.   CBC shows a white count of 6.7, hemoglobin of 11.2, hematocrit of 3 and  platelets of 115,000, sodium 140, potassium 4.2, chloride 102, CO2  30,  glucose 120, BUN 12, creatinine 1.27, PT 15, INR 1.1, PTT 27, urine  culture negative.   IMPRESSION:  Status post laparoscopic assisted total abdominal  hysterectomy and anterior and posterior repair with left renal  __________.   PLAN:  After discussing with Dr. Jennette Kettle and the patient, a left  percutaneous nephrostomy will  be placed to drain the kidney and the  urinoma.  We will attempt secondary antegrade double-J stent at a later  date.  She will be admitted at this time.     ______________________________  Alessandra Bevels. Chase Picket, FNP-C      Ronald L. Earlene Plater, M.D.  Electronically Signed    JML/MEDQ  D:  04/04/2008  T:  04/04/2008  Job:  [id number]   cc:   Freddy Finner, M.D.  Fax: [phone]

## 2011-01-03 NOTE — Discharge Summary (Signed)
NAME:  Christine Mosley, Christine Mosley                ACCOUNT NO.:  [id number]   MEDICAL RECORD NO.:  [id number]          PATIENT TYPE:  INP   LOCATION:  1440                         FACILITY:  [facility]   PHYSICIAN:  Ronald L. Earlene Plater, M.D.  DATE OF BIRTH:  November 26, 1955   DATE OF ADMISSION:  02/28/2008  DATE OF DISCHARGE:  03/02/2008                               DISCHARGE SUMMARY   ADMISSION DIAGNOSES:  1. Left hydroureteronephrosis.  2. Left urinoma.  3. Status post total abdominal hysterectomy and anterior and posterior      repair.   DISCHARGE DIAGNOSES:  1. Left hydroureteronephrosis.  2. Left urinoma.  3. Status post total abdominal hysterectomy and anterior and posterior      repair.  4. Status post left percutaneous nephrostomy internalization to      antegrade double-J stent on the left side.   BRIEF HISTORY:  Christine Mosley is a 55 year old black female who underwent a  stat laparoscopic TAH and anterior and posterior repair by Dr. Konrad Dolores.  Initially her postoperative course was without complaints.  However, she  began noticing intermittent left flank pain.  She was seen by Dr. Jennette Kettle  on February 27, 2008 with nausea, left flank pain and was found to have a  left hydronephrosis on ultrasound.  CT scan without contrast February 28, 2008 noted a left hydronephrosis with confined urinoma.  The ureteral  nephrosis extended to the left pelvis.  She was admitted for IV fluids  and antibiotics and placement of a left percutaneous nephrostomy which  was later converted to a left double-J antegrade stent.   PAST MEDICAL HISTORY:  Breast cancer for which she takes tamoxifen.   PAST SURGICAL HISTORY:  1. Left salpingectomy for ectopic pregnancy in 1995.  2. Three vaginal deliveries.  3. Cesarean section.  4. Right breast surgery.   ALLERGIES:  CODEINE.   HOSPITAL COURSE:  On July 10, the patient was admitted to [facility] under the care of Dr. Earlene Plater.  Left percutaneous nephrostomy  tube was placed  without difficulty.  She did have good drainage.  Foley  catheter remained intact with clear urine.  Pain and nausea were  improved.  Cultures remained pending.  On July 13, the patient had  internalization of left percutaneous nephrostomy tube and left ureteral  stent.  She was observed for several more hours and was discharged home  on March 02, 2008 with plans to follow up later in the week with Alessandra Bevels. Chase Picket, FNP-C.   Plan will be to balloon dilate the obstruction in a few weeks.   DISCHARGE MEDICATIONS:  Darvocet and resume vitamin D and tamoxifen.   ACTIVITY:  Increase slowly.   DIET:  As tolerated.   FOLLOW UP:  As stated.   WOUND CARE:  She may shower.     ______________________________  Alessandra Bevels. Chase Picket, FNP-C      Ronald L. Earlene Plater, M.D.  Electronically Signed    JML/MEDQ  D:  04/04/2008  T:  04/04/2008  Job:  [id number]   cc:   Freddy Finner, M.D.  Fax:  [phone] 

## 2011-01-03 NOTE — Discharge Summary (Signed)
NAME:  Christine Mosley, EMS                ACCOUNT NO.:  [id number]   MEDICAL RECORD NO.:  [id number]          PATIENT TYPE:  INP   LOCATION:  9304                          FACILITY:  [facility]   PHYSICIAN:  Freddy Finner, M.D.   DATE OF BIRTH:  04-18-1956   DATE OF ADMISSION:  02/03/2008  DATE OF DISCHARGE:  02/07/2008                               DISCHARGE SUMMARY   DISCHARGE DIAGNOSES:  Third-degree cervical prolapse, symptomatic second-  degree cystocele, second-degree rectocele.   SECONDARY DIAGNOSIS:  History of recent diagnosis of breast cancer, now  on tamoxifen for chemotherapeutic treatment.   OPERATIVE PROCEDURE:  Laparoscopically-assisted vaginal hysterectomy,  bilateral salpingo-oophorectomy, lysis of omental adhesions, anterior  and posterior vaginal repair, and suprapubic cystotomy with banana  catheter.   INTRAOPERATIVE AND POSTOPERATIVE COMPLICATIONS:  None.   Details of the present illness, past history, family history, review of  systems and physical exam are recorded in the admission note and/or in  the operative summary.  The findings were primarily remarkable for the  marked pelvic relaxation with cervical prolapse through the vaginal  introitus and second-degree cystocele and rectocele.   LABORATORY DATA:  During this admission includes a normal CBC on  admission with hemoglobin of 14.1, admission prothrombin time and PTT  were normal.  Postoperative CBC showed a hemoglobin of 11.1 and a white  count of 10.3.  Admission urinalysis was normal.   HOSPITAL COURSE:  The patient was admitted on the morning of surgery.  She was given an IV antibiotic preoperatively.  She was placed on PAS  compression hose on her lower extremities.  She was brought to the  operating room and there placed under adequate general endotracheal  anesthesia.  The above-described operative procedure was accomplished  without any significant issues during the procedure including no  apparent  complications at that time.  There was some difficulty placing  the suprapubic banana catheter, but this was easily resolved by  overfilling the bladder to a large degree and during laparoscopically  guided placement of the device.  Her postoperative course was  uncomplicated.  She remained afebrile throughout the hospital stay.  She  did have some mild anemia in the early postoperative period.  She did  remain in the hospital until the fourth postoperative day for voiding  trials and by the fourth postoperative day, we were able to remove her  suprapubic catheter.  She was voiding 350 mL approximately with a  residual of 100 or less.  At that time, she was tolerating a regular  diet.  She was ambulating without assistance.  She had minimal vaginal  drainage and no voiced complaints.  Her condition  was considered to be good and she was discharged home with Percocet  5/325 to be taken 1-2 every 4 hours as needed for pain.  She is to  resume any and all her preoperative medications.  She is to return to  the office in 2 weeks for postoperative followup.      Freddy Finner, M.D.  Electronically Signed     WRN/MEDQ  D:  02/29/2008  T:  03/01/2008  Job:  [id number]

## 2011-01-06 NOTE — Op Note (Signed)
NAME:  Christine Mosley, Christine Mosley                          ACCOUNT NO.:  [id number]   MEDICAL RECORD NO.:  [id number]                   PATIENT TYPE:  AMB   LOCATION:  DSC                                  FACILITY:  [facility]   PHYSICIAN:  Vikki Ports, M.D.         DATE OF BIRTH:  24-Apr-1956   DATE OF PROCEDURE:  DATE OF DISCHARGE:                                 OPERATIVE REPORT   NO DICTATION.                                               Vikki Ports, M.D.    KRH/MEDQ  D:  12/08/2003  T:  12/09/2003  Job:  [id number]

## 2011-01-06 NOTE — Op Note (Signed)
NAME:  Christine Mosley, Christine Mosley                          ACCOUNT NO.:  [id number]   MEDICAL RECORD NO.:  [id number]                   PATIENT TYPE:  AMB   LOCATION:  DSC                                  FACILITY:  [facility]   PHYSICIAN:  Vikki Ports, M.D.         DATE OF BIRTH:  04/25/1956   DATE OF PROCEDURE:  12/08/2003  DATE OF DISCHARGE:                                 OPERATIVE REPORT   PREOPERATIVE DIAGNOSES:  Invasive right breast cancer.   POSTOPERATIVE DIAGNOSES:  Invasive right breast cancer.   OPERATION PERFORMED:  1. Blue dye injection.  2. Lymphatic mapping.  3. Right partial mastectomy.  4. Right axillary sentinel lymph node biopsy.   SURGEON:  Vikki Ports, M.D.   ANESTHESIA:  General.   DESCRIPTION OF PROCEDURE:  The patient was taken to the operating room.  After general anesthesia was induced, the right axilla and breast were  prepped and draped in the normal sterile fashion.  4 mL of Lymphazurin blue  dye were injected in four quadrants in the retroareolar space.  This was  massaged in place for five minutes.  Using the Neoprobe, an area of high  activity in the right axilla was identified.  A transverse incision was made  through the skin and subcutaneous tissue.  The lateral borders of the  pectoralis major muscle were identified and retracted medially.  I had  easily identified two hot blue lymph nodes which were grasped with Babcocks.  Small lymphatic tributaries were clipped with Hemoclips.  Nodes were sent  for imprint cytology which came back negative for metastasis.  The incision  was closed with subcuticular 4-0 Monocryl.   I then turned my attention to the right breast.  A palpable mass in the 11  to 12 o'clock region of the right breast.  A curvilinear incision was made  over this.  Making a fairly thin skin flaps in both superior and inferior  directions, I dissected down all the way around the tumor.  It was excised  completely.  However,  on the most posterior aspect there was a significant  amount of fibrosis and I could not tell whether or not the tumor extended  into ths region and therefore an additional 1 cm posterior margin was taken  and sent for margins separately.  Adequate hemostasis was ensured and the  skin was closed with subcuticular 4-0 Monocryl.  Steri-Strips and sterile  dressings were applied.  The patient tolerated the procedure well and went  to PACU in good condition.                                               Vikki Ports, M.D.    KRH/MEDQ  D:  12/08/2003  T:  12/09/2003  Job:  [id number]

## 2011-05-15 ENCOUNTER — Other Ambulatory Visit: Payer: Self-pay | Admitting: Oncology

## 2011-05-15 DIAGNOSIS — Z9889 Other specified postprocedural states: Secondary | ICD-10-CM

## 2011-05-15 DIAGNOSIS — N644 Mastodynia: Secondary | ICD-10-CM

## 2011-05-15 DIAGNOSIS — Z853 Personal history of malignant neoplasm of breast: Secondary | ICD-10-CM

## 2011-05-18 LAB — URINALYSIS, ROUTINE W REFLEX MICROSCOPIC
Bilirubin Urine: NEGATIVE
Glucose, UA: NEGATIVE
Hgb urine dipstick: NEGATIVE
Ketones, ur: NEGATIVE
Nitrite: NEGATIVE
Protein, ur: NEGATIVE
Specific Gravity, Urine: 1.015
Urobilinogen, UA: 0.2
pH: 6

## 2011-05-18 LAB — CBC
HCT: 39.9
HCT: 32.3 — ABNORMAL LOW
HCT: 33 — ABNORMAL LOW
Hemoglobin: 14.1
Hemoglobin: 11.1 — ABNORMAL LOW
Hemoglobin: 11.2 — ABNORMAL LOW
MCHC: 35.4
MCHC: 34.3
MCHC: 33.9
MCV: 84.6
MCV: 86.1
MCV: 81.3
Platelets: 211
Platelets: 172
Platelets: 415 — ABNORMAL HIGH
RBC: 4.72
RBC: 3.75 — ABNORMAL LOW
RBC: 4.06
RDW: 13.3
RDW: 13.5
RDW: 13.8
WBC: 5.7
WBC: 10.3
WBC: 6.7

## 2011-05-18 LAB — COMPREHENSIVE METABOLIC PANEL
ALT: 13
AST: 16
Albumin: 3.5
Alkaline Phosphatase: 67
BUN: 12
CO2: 30
Calcium: 9.5
Chloride: 102
Creatinine, Ser: 1.27 — ABNORMAL HIGH
GFR calc Af Amer: 54 — ABNORMAL LOW
GFR calc non Af Amer: 45 — ABNORMAL LOW
Glucose, Bld: 120 — ABNORMAL HIGH
Potassium: 4.2
Sodium: 140
Total Bilirubin: 0.7
Total Protein: 7.8

## 2011-05-18 LAB — PROTIME-INR
INR: 1.1
INR: 1.1
Prothrombin Time: 13.9
Prothrombin Time: 15

## 2011-05-18 LAB — URINE CULTURE
Colony Count: NO GROWTH
Culture: NO GROWTH
Special Requests: NEGATIVE

## 2011-05-18 LAB — APTT
aPTT: 29
aPTT: 27

## 2011-05-18 LAB — BASIC METABOLIC PANEL
BUN: 5 — ABNORMAL LOW
CO2: 28
Calcium: 9.2
Chloride: 109
Creatinine, Ser: 0.69
GFR calc Af Amer: >60
GFR calc non Af Amer: >60
Glucose, Bld: 105 — ABNORMAL HIGH
Potassium: 3.9
Sodium: 143

## 2011-05-26 ENCOUNTER — Ambulatory Visit
Admission: RE | Admit: 2011-05-26 | Discharge: 2011-05-26 | Disposition: A | Discharge status: Home / Self Care | Payer: BC Managed Care – PPO | Source: Ambulatory Visit | Attending: Oncology | Admitting: Oncology

## 2011-05-26 DIAGNOSIS — N644 Mastodynia: Secondary | ICD-10-CM

## 2011-05-26 DIAGNOSIS — Z853 Personal history of malignant neoplasm of breast: Secondary | ICD-10-CM

## 2011-05-26 DIAGNOSIS — Z9889 Other specified postprocedural states: Secondary | ICD-10-CM

## 2011-11-01 ENCOUNTER — Other Ambulatory Visit: Payer: Self-pay | Admitting: Cardiology

## 2011-11-01 ENCOUNTER — Ambulatory Visit
Admission: RE | Admit: 2011-11-01 | Discharge: 2011-11-01 | Disposition: A | Discharge status: Home / Self Care | Payer: BC Managed Care – PPO | Source: Ambulatory Visit | Attending: Cardiology | Admitting: Cardiology

## 2011-11-01 DIAGNOSIS — J8410 Pulmonary fibrosis, unspecified: Secondary | ICD-10-CM

## 2012-05-20 ENCOUNTER — Other Ambulatory Visit: Payer: Self-pay | Admitting: Family Medicine

## 2012-05-20 DIAGNOSIS — Z1231 Encounter for screening mammogram for malignant neoplasm of breast: Secondary | ICD-10-CM

## 2012-05-20 DIAGNOSIS — Z853 Personal history of malignant neoplasm of breast: Secondary | ICD-10-CM

## 2012-05-27 ENCOUNTER — Ambulatory Visit
Admission: RE | Admit: 2012-05-27 | Discharge: 2012-05-27 | Disposition: A | Discharge status: Home / Self Care | Payer: BC Managed Care – PPO | Source: Ambulatory Visit | Attending: Family Medicine | Admitting: Family Medicine

## 2012-05-27 DIAGNOSIS — Z853 Personal history of malignant neoplasm of breast: Secondary | ICD-10-CM

## 2012-05-27 DIAGNOSIS — Z1231 Encounter for screening mammogram for malignant neoplasm of breast: Secondary | ICD-10-CM

## 2013-06-02 ENCOUNTER — Other Ambulatory Visit: Payer: Self-pay

## 2013-06-02 DIAGNOSIS — Z1231 Encounter for screening mammogram for malignant neoplasm of breast: Secondary | ICD-10-CM

## 2013-06-23 ENCOUNTER — Ambulatory Visit: Payer: BC Managed Care – PPO

## 2013-07-14 ENCOUNTER — Ambulatory Visit
Admission: RE | Admit: 2013-07-14 | Discharge: 2013-07-14 | Disposition: A | Discharge status: Home / Self Care | Payer: BC Managed Care – PPO | Source: Ambulatory Visit

## 2013-07-14 DIAGNOSIS — Z1231 Encounter for screening mammogram for malignant neoplasm of breast: Secondary | ICD-10-CM

## 2014-10-16 ENCOUNTER — Other Ambulatory Visit: Payer: Self-pay

## 2014-10-16 DIAGNOSIS — Z1231 Encounter for screening mammogram for malignant neoplasm of breast: Secondary | ICD-10-CM

## 2014-10-19 ENCOUNTER — Ambulatory Visit
Admission: RE | Admit: 2014-10-19 | Discharge: 2014-10-19 | Disposition: A | Discharge status: Home / Self Care | Payer: BLUE CROSS/BLUE SHIELD | Source: Ambulatory Visit

## 2014-10-19 ENCOUNTER — Encounter (INDEPENDENT_AMBULATORY_CARE_PROVIDER_SITE_OTHER): Payer: Self-pay

## 2014-10-19 DIAGNOSIS — Z1231 Encounter for screening mammogram for malignant neoplasm of breast: Secondary | ICD-10-CM

## 2015-02-15 ENCOUNTER — Other Ambulatory Visit: Payer: Self-pay | Admitting: Obstetrics & Gynecology

## 2015-02-16 LAB — CYTOLOGY - PAP

## 2015-11-16 ENCOUNTER — Other Ambulatory Visit: Payer: Self-pay

## 2015-11-16 DIAGNOSIS — Z1231 Encounter for screening mammogram for malignant neoplasm of breast: Secondary | ICD-10-CM

## 2015-11-24 ENCOUNTER — Ambulatory Visit
Admission: RE | Admit: 2015-11-24 | Discharge: 2015-11-24 | Disposition: A | Discharge status: Home / Self Care | Payer: BLUE CROSS/BLUE SHIELD | Source: Ambulatory Visit

## 2015-11-24 DIAGNOSIS — Z1231 Encounter for screening mammogram for malignant neoplasm of breast: Secondary | ICD-10-CM

## 2016-11-03 ENCOUNTER — Other Ambulatory Visit: Payer: Self-pay | Admitting: Family Medicine

## 2016-11-03 DIAGNOSIS — Z1231 Encounter for screening mammogram for malignant neoplasm of breast: Secondary | ICD-10-CM

## 2016-12-04 ENCOUNTER — Ambulatory Visit
Admission: RE | Admit: 2016-12-04 | Discharge: 2016-12-04 | Disposition: A | Discharge status: Home / Self Care | Payer: BLUE CROSS/BLUE SHIELD | Source: Ambulatory Visit | Attending: Family Medicine | Admitting: Family Medicine

## 2016-12-04 DIAGNOSIS — Z1231 Encounter for screening mammogram for malignant neoplasm of breast: Secondary | ICD-10-CM

## 2018-01-03 ENCOUNTER — Other Ambulatory Visit: Payer: Self-pay | Admitting: Family Medicine

## 2018-01-03 DIAGNOSIS — Z1231 Encounter for screening mammogram for malignant neoplasm of breast: Secondary | ICD-10-CM

## 2018-01-31 ENCOUNTER — Ambulatory Visit
Admission: RE | Admit: 2018-01-31 | Discharge: 2018-01-31 | Disposition: A | Discharge status: Home / Self Care | Payer: BLUE CROSS/BLUE SHIELD | Source: Ambulatory Visit | Attending: Family Medicine | Admitting: Family Medicine

## 2018-01-31 DIAGNOSIS — Z1231 Encounter for screening mammogram for malignant neoplasm of breast: Secondary | ICD-10-CM

## 2019-01-28 ENCOUNTER — Other Ambulatory Visit: Payer: Self-pay | Admitting: Family Medicine

## 2019-01-28 DIAGNOSIS — Z1231 Encounter for screening mammogram for malignant neoplasm of breast: Secondary | ICD-10-CM

## 2019-03-17 ENCOUNTER — Other Ambulatory Visit: Payer: Self-pay

## 2019-03-17 ENCOUNTER — Ambulatory Visit
Admission: RE | Admit: 2019-03-17 | Discharge: 2019-03-17 | Disposition: A | Discharge status: Home / Self Care | Payer: BLUE CROSS/BLUE SHIELD | Source: Ambulatory Visit | Attending: Family Medicine | Admitting: Family Medicine

## 2019-03-17 DIAGNOSIS — Z1231 Encounter for screening mammogram for malignant neoplasm of breast: Secondary | ICD-10-CM

## 2020-02-06 ENCOUNTER — Other Ambulatory Visit: Payer: Self-pay | Admitting: Family Medicine

## 2020-02-06 DIAGNOSIS — Z1231 Encounter for screening mammogram for malignant neoplasm of breast: Secondary | ICD-10-CM

## 2020-03-22 ENCOUNTER — Other Ambulatory Visit: Payer: Self-pay

## 2020-03-22 ENCOUNTER — Ambulatory Visit
Admission: RE | Admit: 2020-03-22 | Discharge: 2020-03-22 | Disposition: A | Discharge status: Home / Self Care | Payer: BC Managed Care – PPO | Source: Ambulatory Visit | Attending: Family Medicine | Admitting: Family Medicine

## 2020-03-22 DIAGNOSIS — Z1231 Encounter for screening mammogram for malignant neoplasm of breast: Secondary | ICD-10-CM

## 2020-03-22 HISTORY — DX: Personal history of antineoplastic chemotherapy: Z92.21

## 2020-03-22 HISTORY — DX: Personal history of irradiation: Z92.3

## 2020-03-24 ENCOUNTER — Other Ambulatory Visit: Payer: Self-pay | Admitting: Family Medicine

## 2020-03-24 DIAGNOSIS — R928 Other abnormal and inconclusive findings on diagnostic imaging of breast: Secondary | ICD-10-CM

## 2020-03-25 ENCOUNTER — Other Ambulatory Visit: Payer: Self-pay | Admitting: Obstetrics & Gynecology

## 2020-03-25 DIAGNOSIS — R928 Other abnormal and inconclusive findings on diagnostic imaging of breast: Secondary | ICD-10-CM

## 2020-04-05 ENCOUNTER — Other Ambulatory Visit: Payer: Self-pay

## 2020-04-05 ENCOUNTER — Ambulatory Visit
Admission: RE | Admit: 2020-04-05 | Discharge: 2020-04-05 | Disposition: A | Discharge status: Home / Self Care | Payer: BC Managed Care – PPO | Source: Ambulatory Visit | Attending: Family Medicine | Admitting: Family Medicine

## 2020-04-05 ENCOUNTER — Other Ambulatory Visit: Payer: Self-pay | Admitting: Obstetrics & Gynecology

## 2020-04-05 DIAGNOSIS — N6320 Unspecified lump in the left breast, unspecified quadrant: Secondary | ICD-10-CM

## 2020-04-05 DIAGNOSIS — R928 Other abnormal and inconclusive findings on diagnostic imaging of breast: Secondary | ICD-10-CM

## 2020-04-06 ENCOUNTER — Ambulatory Visit
Admission: RE | Admit: 2020-04-06 | Discharge: 2020-04-06 | Disposition: A | Discharge status: Home / Self Care | Payer: BC Managed Care – PPO | Source: Ambulatory Visit | Attending: Obstetrics & Gynecology | Admitting: Obstetrics & Gynecology

## 2020-04-06 ENCOUNTER — Other Ambulatory Visit: Payer: Self-pay | Admitting: Obstetrics & Gynecology

## 2020-04-06 DIAGNOSIS — N6320 Unspecified lump in the left breast, unspecified quadrant: Secondary | ICD-10-CM

## 2020-04-06 HISTORY — PX: BREAST BIOPSY: SHX20

## 2020-04-07 ENCOUNTER — Telehealth: Payer: Self-pay | Admitting: Oncology

## 2020-04-07 NOTE — Telephone Encounter (Signed)
Spoke to patient to confirm morning [facility] appointment for 8/25, packet will be mailed to patient

## 2020-04-08 ENCOUNTER — Encounter: Payer: Self-pay | Admitting: *Deleted

## 2020-04-12 ENCOUNTER — Encounter: Payer: Self-pay | Admitting: *Deleted

## 2020-04-12 DIAGNOSIS — C50212 Malignant neoplasm of upper-inner quadrant of left female breast: Secondary | ICD-10-CM

## 2020-04-12 DIAGNOSIS — Z170 Estrogen receptor positive status [ER+]: Secondary | ICD-10-CM

## 2020-04-14 ENCOUNTER — Other Ambulatory Visit: Payer: Self-pay

## 2020-04-14 ENCOUNTER — Ambulatory Visit (HOSPITAL_BASED_OUTPATIENT_CLINIC_OR_DEPARTMENT_OTHER): Payer: BC Managed Care – PPO | Admitting: Genetic Counselor

## 2020-04-14 ENCOUNTER — Ambulatory Visit: Payer: BC Managed Care – PPO | Attending: General Surgery | Admitting: Physical Therapy

## 2020-04-14 ENCOUNTER — Other Ambulatory Visit: Payer: Self-pay | Admitting: General Surgery

## 2020-04-14 ENCOUNTER — Encounter: Payer: Self-pay | Admitting: Physical Therapy

## 2020-04-14 ENCOUNTER — Other Ambulatory Visit: Payer: Self-pay | Admitting: *Deleted

## 2020-04-14 ENCOUNTER — Inpatient Hospital Stay: Payer: BC Managed Care – PPO | Admitting: Licensed Clinical Social Worker

## 2020-04-14 ENCOUNTER — Encounter: Payer: Self-pay | Admitting: Radiation Oncology

## 2020-04-14 ENCOUNTER — Inpatient Hospital Stay: Payer: BC Managed Care – PPO

## 2020-04-14 ENCOUNTER — Encounter: Payer: Self-pay | Admitting: Genetic Counselor

## 2020-04-14 ENCOUNTER — Ambulatory Visit
Admission: RE | Admit: 2020-04-14 | Discharge: 2020-04-14 | Disposition: A | Discharge status: Home / Self Care | Payer: BC Managed Care – PPO | Source: Ambulatory Visit | Attending: Radiation Oncology | Admitting: Radiation Oncology

## 2020-04-14 ENCOUNTER — Encounter: Payer: Self-pay | Admitting: *Deleted

## 2020-04-14 ENCOUNTER — Encounter: Payer: Self-pay | Admitting: Oncology

## 2020-04-14 ENCOUNTER — Inpatient Hospital Stay (HOSPITAL_BASED_OUTPATIENT_CLINIC_OR_DEPARTMENT_OTHER): Payer: BC Managed Care – PPO

## 2020-04-14 ENCOUNTER — Inpatient Hospital Stay: Payer: BC Managed Care – PPO | Attending: Oncology | Admitting: Oncology

## 2020-04-14 VITALS — BP 117/67 | HR 88 | Temp 97.9°F | Resp 18 | Ht 65.0 in | Wt 180.8 lb

## 2020-04-14 DIAGNOSIS — C50212 Malignant neoplasm of upper-inner quadrant of left female breast: Secondary | ICD-10-CM

## 2020-04-14 DIAGNOSIS — C50411 Malignant neoplasm of upper-outer quadrant of right female breast: Secondary | ICD-10-CM | POA: Insufficient documentation

## 2020-04-14 DIAGNOSIS — Z808 Family history of malignant neoplasm of other organs or systems: Secondary | ICD-10-CM

## 2020-04-14 DIAGNOSIS — Z9221 Personal history of antineoplastic chemotherapy: Secondary | ICD-10-CM | POA: Insufficient documentation

## 2020-04-14 DIAGNOSIS — Z170 Estrogen receptor positive status [ER+]: Secondary | ICD-10-CM

## 2020-04-14 DIAGNOSIS — Z923 Personal history of irradiation: Secondary | ICD-10-CM | POA: Diagnosis not present

## 2020-04-14 DIAGNOSIS — Z23 Encounter for immunization: Secondary | ICD-10-CM | POA: Diagnosis not present

## 2020-04-14 DIAGNOSIS — Z801 Family history of malignant neoplasm of trachea, bronchus and lung: Secondary | ICD-10-CM | POA: Diagnosis not present

## 2020-04-14 DIAGNOSIS — Z8042 Family history of malignant neoplasm of prostate: Secondary | ICD-10-CM

## 2020-04-14 DIAGNOSIS — R293 Abnormal posture: Secondary | ICD-10-CM | POA: Insufficient documentation

## 2020-04-14 LAB — CBC WITH DIFFERENTIAL (CANCER CENTER ONLY)
Abs Immature Granulocytes: 0.02 10*3/uL (ref 0.00–0.07)
Basophils Absolute: 0.1 10*3/uL (ref 0.0–0.1)
Basophils Relative: 1 %
Eosinophils Absolute: 0.1 10*3/uL (ref 0.0–0.5)
Eosinophils Relative: 2 %
HCT: 43.4 % (ref 36.0–46.0)
Hemoglobin: 14.5 g/dL (ref 12.0–15.0)
Immature Granulocytes: 0 %
Lymphocytes Relative: 26 %
Lymphs Abs: 2 10*3/uL (ref 0.7–4.0)
MCH: 29.1 pg (ref 26.0–34.0)
MCHC: 33.4 g/dL (ref 30.0–36.0)
MCV: 87 fL (ref 80.0–100.0)
Monocytes Absolute: 0.5 10*3/uL (ref 0.1–1.0)
Monocytes Relative: 7 %
Neutro Abs: 4.8 10*3/uL (ref 1.7–7.7)
Neutrophils Relative %: 64 %
Platelet Count: 274 10*3/uL (ref 150–400)
RBC: 4.99 MIL/uL (ref 3.87–5.11)
RDW: 12.7 % (ref 11.5–15.5)
WBC Count: 7.4 10*3/uL (ref 4.0–10.5)
nRBC: 0 % (ref 0.0–0.2)

## 2020-04-14 LAB — CMP (CANCER CENTER ONLY)
ALT: 15 U/L (ref 0–44)
AST: 14 U/L — ABNORMAL LOW (ref 15–41)
Albumin: 4.2 g/dL (ref 3.5–5.0)
Alkaline Phosphatase: 67 U/L (ref 38–126)
Anion gap: 7 (ref 5–15)
BUN: 9 mg/dL (ref 8–23)
CO2: 29 mmol/L (ref 22–32)
Calcium: 10.4 mg/dL — ABNORMAL HIGH (ref 8.9–10.3)
Chloride: 104 mmol/L (ref 98–111)
Creatinine: 0.94 mg/dL (ref 0.44–1.00)
GFR, Est AFR Am: >60 mL/min (ref >60)
GFR, Estimated: >60 mL/min (ref >60)
Glucose, Bld: 192 mg/dL — ABNORMAL HIGH (ref 70–99)
Potassium: 4.2 mmol/L (ref 3.5–5.1)
Sodium: 140 mmol/L (ref 135–145)
Total Bilirubin: 0.8 mg/dL (ref 0.3–1.2)
Total Protein: 7.8 g/dL (ref 6.5–8.1)

## 2020-04-14 NOTE — Progress Notes (Signed)
15 minute evaluation post administering Pfizer Covid injection. Pt with no complaints. Discharged with daughter with printed information on [employer] vaccine given with completed booster card.

## 2020-04-14 NOTE — Therapy (Signed)
[place], [place], [address] Phone: [phone]   Fax:  [phone]  Physical Therapy Evaluation  Patient Details  Name: Christine Mosley MRN: [id number] Date of Birth: 63/05/1956 Referring Provider (PT): Dr. Rolm Bookbinder   Encounter Date: 04/14/2020   PT End of Session - 04/14/20 2121    Visit Number 1    Number of Visits 2    Date for PT Re-Evaluation 06/09/20    PT Start Time 0928    PT Stop Time 0945   Also saw pt from 1143-1200 for a total of 34 minutes   PT Time Calculation (min) 17 min    Activity Tolerance Patient tolerated treatment well    Behavior During Therapy [facility] for tasks assessed/performed           Past Medical History:  Diagnosis Date  . Breast cancer ([place])   . Family history of lung cancer   . Family history of prostate cancer   . Family history of skin cancer   . GERD (gastroesophageal reflux disease)   . Personal history of chemotherapy   . Personal history of radiation therapy     Past Surgical History:  Procedure Laterality Date  . ABDOMINAL HYSTERECTOMY    . BREAST LUMPECTOMY Right 2005  . SHOULDER SURGERY      There were no vitals filed for this visit.    Subjective Assessment - 04/14/20 2111    Subjective Patient reports she is here today to be seen by her medical team for her newly diagnosed left breast cancer.    Patient is accompained by: Family member    Pertinent History Patient was diagnosed on 03/22/2020 with left grade II invasive ductal carcinoma breast cancer. It measures 7 mm and is located in the upper inner quadrant. It is ER/PR positive and HER2 negative with a Ki67 of 5%. She had a left shoulder scope 10/30/2019. She had previous right breast cancer in 2005 and was treated with a right lumpectomy, sentinel node biopsy, chemotherapy and radiation.    Patient Stated Goals Reduce lymphedema risk and learn post op shoulder ROM HEP    Currently in  Pain? No/denies              [facility] PT Assessment - 04/14/20 0001      Assessment   Medical Diagnosis Left breast cancer    Referring Provider (PT) Dr. Rolm Bookbinder    Onset Date/Surgical Date 03/22/20    Hand Dominance Right    Prior Therapy none      Precautions   Precautions Other (comment)    Precaution Comments active cancer      Restrictions   Weight Bearing Restrictions No      Balance Screen   Has the patient fallen in the past 6 months Yes    How many times? 1   Tripped   Has the patient had a decrease in activity level because of a fear of falling?  No    Is the patient reluctant to leave their home because of a fear of falling?  No      Home Environment   Living Environment Private residence    Living Arrangements Spouse/significant other;Children   Husband and 63 y.o. son   Available Help at Discharge Family      Prior Function   Level of [place] Unemployed    Leisure She does not exercise      Cognition  Overall Cognitive Status Within Functional Limits for tasks assessed      Posture/Postural Control   Posture/Postural Control Postural limitations    Postural Limitations Rounded Shoulders;Forward head      ROM / Strength   AROM / PROM / Strength AROM;Strength      AROM   Overall AROM Comments Let rotation and extension limited 50%; other cervical AROM is WNL    AROM Assessment Site Shoulder    Right/Left Shoulder Right;Left    Right Shoulder Extension 43 Degrees    Right Shoulder Flexion 145 Degrees    Right Shoulder ABduction 151 Degrees    Right Shoulder Internal Rotation 61 Degrees    Right Shoulder External Rotation 75 Degrees    Left Shoulder Extension 40 Degrees    Left Shoulder Flexion 147 Degrees    Left Shoulder ABduction 143 Degrees    Left Shoulder Internal Rotation 67 Degrees    Left Shoulder External Rotation 56 Degrees      Strength   Overall Strength Within functional limits for tasks  performed             LYMPHEDEMA/ONCOLOGY QUESTIONNAIRE - 04/14/20 0001      Type   Cancer Type Left breast       Lymphedema Assessments   Lymphedema Assessments Upper extremities      Right Upper Extremity Lymphedema   10 cm Proximal to Olecranon Process 29.8 cm    Olecranon Process 26.6 cm    10 cm Proximal to Ulnar Styloid Process 23.5 cm    Just Proximal to Ulnar Styloid Process 16.7 cm    Across Hand at [employer] 19.7 cm    At [place] of 2nd Digit 7 cm      Left Upper Extremity Lymphedema   10 cm Proximal to Olecranon Process 29.6 cm    Olecranon Process 25.9 cm    10 cm Proximal to Ulnar Styloid Process 22.6 cm    Just Proximal to Ulnar Styloid Process 16.6 cm    Across Hand at [employer] 19.4 cm    At [place] of 2nd Digit 6.6 cm           L-DEX FLOWSHEETS - 04/14/20 2100      L-DEX LYMPHEDEMA SCREENING   Measurement Type Unilateral    L-DEX MEASUREMENT EXTREMITY Upper Extremity    POSITION  Standing    DOMINANT SIDE Left    At Risk Side Right    BASELINE SCORE (UNILATERAL) -2.6           The patient was assessed using the L-Dex machine today to produce a lymphedema index baseline score. The patient will be reassessed on a regular basis (typically every 3 months) to obtain new L-Dex scores. If the score is > 6.5 points away from his/her baseline score indicating onset of subclinical lymphedema, it will be recommended to wear a compression garment for 4 weeks, 12 hours per day and then be reassessed. If the score continues to be > 6.5 points from baseline at reassessment, we will initiate lymphedema treatment. Assessing in this manner has a 95% rate of preventing clinically significant lymphedema.      Katina Dung - 04/14/20 0001    Open a tight or new jar Mild difficulty    Do heavy household chores (wash walls, wash floors) Mild difficulty    Carry a shopping bag or briefcase Mild difficulty    Wash your back Mild difficulty    Use a knife to cut  food Mild difficulty    Recreational activities in which you take some force or impact through your arm, shoulder, or hand (golf, hammering, tennis) Mild difficulty    During the past week, to what extent has your arm, shoulder or hand problem interfered with your normal social activities with family, friends, neighbors, or groups? Slightly    During the past week, to what extent has your arm, shoulder or hand problem limited your work or other regular daily activities Slightly    Arm, shoulder, or hand pain. Mild    Tingling (pins and needles) in your arm, shoulder, or hand Mild    Difficulty Sleeping Mild difficulty    DASH Score 25 %            Objective measurements completed on examination: See above findings.      Patient was instructed today in a home exercise program today for post op shoulder range of motion. These included active assist shoulder flexion in sitting, scapular retraction, wall walking with shoulder abduction, and hands behind head external rotation.  She was encouraged to do these twice a day, holding 3 seconds and repeating 5 times when permitted by her physician.             PT Education - 04/14/20 2120    Education Details Lymphedema risk reduction and post op shoulder ROM HEP    Person(s) Educated Patient;Other (comment)   sister   Methods Explanation;Demonstration;Handout    Comprehension Verbalized understanding;Returned demonstration               PT Long Term Goals - 04/14/20 2126      PT LONG TERM GOAL #1   Title Patient will demonstrate she has regained full shoulder ROM and function post operatively compared to baselines.    Time 8    Period Weeks    Status New    Target Date 06/09/20           Breast Clinic Goals - 04/14/20 2125      Patient will be able to verbalize understanding of pertinent lymphedema risk reduction practices relevant to her diagnosis specifically related to skin care.   Time 1    Period Days    Status  Achieved      Patient will be able to return demonstrate and/or verbalize understanding of the post-op home exercise program related to regaining shoulder range of motion.   Time 1    Period Days    Status Achieved      Patient will be able to verbalize understanding of the importance of attending the postoperative After Breast Cancer Class for further lymphedema risk reduction education and therapeutic exercise.   Time 1    Period Days    Status Achieved                 Plan - 04/14/20 2122    Clinical Impression Statement Patient was diagnosed on 03/22/2020 with left grade II invasive ductal carcinoma breast cancer. It measures 7 mm and is located in the upper inner quadrant. It is ER/PR positive and HER2 negative with a Ki67 of 5%. She had a left shoulder scope 10/30/2019. She had previous right breast cancer in 2005 and was treated with a right lumpectomy, sentinel node biopsy, chemotherapy and radiation. Her multidisciplinary medical team met prior to her assessments to determine a recommended treatment plan. She is planning to have a left lumpectomy and sentinel node biopsy followed by Oncotype testing, radiation, and anti-estrogen therapy.  She will benefit from a post op PT reasessment to determine needs followed by L-Dex screenings every 3 months to detect subclinical lymphedema.    Stability/Clinical Decision Making Stable/Uncomplicated    Clinical Decision Making Low    Rehab Potential Excellent    PT Frequency --   Eval and 1 f/u visit   PT Treatment/Interventions ADLs/Self Care Home Management;Therapeutic exercise;Patient/family education    PT Next Visit Plan Will reasess 3-4 weeks post op to determine needs    PT Home Exercise Plan Post op shoulder ROM HEP    Consulted and Agree with Plan of Care Patient;Family member/caregiver    Family Member Consulted daughter           Patient will benefit from skilled therapeutic intervention in order to improve the following  deficits and impairments:  Postural dysfunction, Decreased range of motion, Decreased knowledge of precautions, Impaired UE functional use, Pain  Visit Diagnosis: Malignant neoplasm of upper-inner quadrant of left breast in female, estrogen receptor positive ([place]) - Plan: PT plan of care cert/re-cert  Abnormal posture - Plan: PT plan of care cert/re-cert   Patient will follow up at outpatient cancer rehab 3-4 weeks following surgery.  If the patient requires physical therapy at that time, a specific plan will be dictated and sent to the referring physician for approval. The patient was educated today on appropriate basic range of motion exercises to begin post operatively and the importance of attending the After Breast Cancer class following surgery.  Patient was educated today on lymphedema risk reduction practices as it pertains to recommendations that will benefit the patient immediately following surgery.  She verbalized good understanding.      Problem List Patient Active Problem List   Diagnosis Date Noted  . Malignant neoplasm of upper-outer quadrant of right breast in female, estrogen receptor positive ([place]) 04/14/2020  . Family history of prostate cancer   . Family history of lung cancer   . Family history of skin cancer   . Malignant neoplasm of upper-inner quadrant of left breast in female, estrogen receptor positive ([place]) 04/12/2020   Annia Friendly, PT 04/14/20 9:28 PM  [place], [place], [address] Phone: [phone]   Fax:  [phone]  Name: TEMPRENCE RHINES MRN: [id number] Date of Birth: 1955-11-01

## 2020-04-14 NOTE — Progress Notes (Signed)
[facility] Clinical Social Work INITIAL SDOH Screening Note   Christine Mosley is a 64 y.o. year old female accompanied by sister, Caryl Asp, during [facility].   Ms. Burklow was given information about support services today including CSW contact information, information about support team members and programs.   SDOH (Social Determinants of Health) assessments performed: Yes SDOH Interventions     Most Recent Value  SDOH Interventions  Housing Interventions Intervention Not Indicated        Family/Social Information:  . Housing Arrangement: patient lives with spouse, Arvin, and youngest son age 54. Patient noted ants in the home on SDOH, but states they can take care of it on their own . Family members/support persons in your life? Sisters (older in [place], younger in [place]), friends, church community . Transportation: drives self. Can stay with sister locally if needed during radiation . Financial concerns: No  o Are you able to meet your monthly expenses or do you feel financially stressed? Able to meet needs o Are you concerned about future financial problems due to your illness or keeping your job and income through treatment? no . Employment: Regulatory affairs officer. Income source: Employment . Food Security: no needs . Religious or spiritual practice: yes, attends church . Medication Concerns: no (if patient is experiencing medication concerns, please refer to pharmacy) . Services Currently in place:  n/a  . Concerns about diagnosis and/or treatment: feels positive because of stage. Has personal history of breast cancer in other breast in 2005  Current coping skills/ strengths: Active sense of humor Capable of independent living Motivation for treatment/growth Religious Affiliation Supportive family/friends     SUMMARY: Current SDOH Barriers:  . None noted today  Clinical Social Work Clinical Goal(s):  Marland Kitchen Patient will continue to attend medical appointments  Interventions: . Patient  interviewed and SDOH assessment performed . Provided patient with information about support team and programs   Follow Up Plan: Client will contact CSW if any emotional/ coping or practical needs arise Patient verbalizes understanding of plan: Yes   Edwinna Areola Alejos Reinhardt LCSW

## 2020-04-14 NOTE — Progress Notes (Signed)
   Covid-19 Vaccination Clinic  Name:  Christine Mosley    MRN: [id number] DOB: 1956/03/10  04/14/2020  Christine Mosley was observed post Covid-19 immunization for [employer] for 15 minutes  without incident. She was provided with Vaccine Information Sheet and instruction to access the V-Safe system.   Christine Mosley was instructed to call 911 with any severe reactions post vaccine: Marland Kitchen Difficulty breathing  . Swelling of face and throat  . A fast heartbeat  . A bad rash all over body  . Dizziness and weakness

## 2020-04-14 NOTE — Progress Notes (Signed)
REFERRING PROVIDER: Chauncey Cruel, MD [address] [place],  [place] [address]  PRIMARY PROVIDER:  [name], PA-C  PRIMARY REASON FOR VISIT:  1. Malignant neoplasm of upper-inner quadrant of left breast in female, estrogen receptor positive ([place])   2. Malignant neoplasm of upper-outer quadrant of right breast in female, estrogen receptor positive ([place])   3. Family history of prostate cancer   4. Family history of lung cancer   5. Family history of skin cancer      I connected with [name] on 04/14/2020 at 11:10 am EDT by video conference and verified that I am speaking with the correct person using two identifiers.   Patient location: [facility] clinic Provider location: [facility] office  HISTORY OF PRESENT ILLNESS:   Christine Mosley, a 64 y.o. female, was seen for a [place] cancer genetics consultation at the request of Dr. Jana Hakim due to a personal and family history of cancer.  [name] presents to clinic today to discuss the possibility of a hereditary predisposition to cancer, genetic testing, and to further clarify her future cancer risks, as well as potential cancer risks for family members.   In April of 2005, at the age of 35, [name] was diagnosed with invasive ductal carcinoma with ductal carcinoma in situ, ER+/PR+/Her2-, of the right breast. The treatment plan included lumpectomy, chemotherapy, radiation therapy, and antiestrogen therapy.  In 2021, at the age of 41, [name] was diagnosed with invasive ductal carcinoma with ductal carcinoma in situ, ER+/PR+/Her2-, of the left breast. The treatment plan includes surgery, oncotype, radiation therapy, and antiestrogen therapy.   RISK FACTORS:  Menarche was at an unknown age.  First live birth at age 53.  OCP use for approximately 0 years.  Ovaries intact: no.  Hysterectomy: yes.  Menopausal status: postmenopausal.  HRT use: 0 years. Colonoscopy: no; Cologuard in 2020. Mammogram within the last year:  yes. Up to date with pelvic exams: yes.   Past Medical History:  Diagnosis Date  . Breast cancer ([place])   . Family history of lung cancer   . Family history of prostate cancer   . Family history of skin cancer   . GERD (gastroesophageal reflux disease)   . Personal history of chemotherapy   . Personal history of radiation therapy     Past Surgical History:  Procedure Laterality Date  . ABDOMINAL HYSTERECTOMY    . BREAST LUMPECTOMY Right 2005  . SHOULDER SURGERY      Social History   Socioeconomic History  . Marital status: Married    Spouse name: Not on file  . Number of children: Not on file  . Years of education: Not on file  . Highest education level: Not on file  Occupational History  . Not on file  Tobacco Use  . Smoking status: Never Smoker  . Smokeless tobacco: Never Used  Substance and Sexual Activity  . Alcohol use: Never  . Drug use: Never  . Sexual activity: Not on file  Other Topics Concern  . Not on file  Social History Narrative  . Not on file   Social Determinants of Health   Financial Resource Strain: Low Risk   . Difficulty of Paying Living Expenses: Not hard at all  Food Insecurity: No Food Insecurity  . Worried About Charity fundraiser in the Last Year: Never true  . Ran Out of Food in the Last Year: Never true  Transportation Needs: No Transportation Needs  . Lack of  Transportation (Medical): No  . Lack of Transportation (Non-Medical): No  Physical Activity:   . Days of Exercise per Week: Not on file  . Minutes of Exercise per Session: Not on file  Stress:   . Feeling of Stress : Not on file  Social Connections:   . Frequency of Communication with Friends and Family: Not on file  . Frequency of Social Gatherings with Friends and Family: Not on file  . Attends Religious Services: Not on file  . Active Member of Clubs or Organizations: Not on file  . Attends Archivist Meetings: Not on file  . Marital Status: Not on file      FAMILY HISTORY:  We obtained a detailed, 4-generation family history.  Significant diagnoses are listed below: Family History  Problem Relation Age of Onset  . Congestive Heart Failure Mother   . Prostate cancer Father 79       metastatic  . Lung cancer Sister 59  . Liver cancer Maternal Aunt 54  . Skin cancer Sister        multiple  . Cancer Maternal Aunt        unknown type, dx. in her 70s  . Lung cancer Cousin        paternal first cousin  . Cancer Cousin        mouth cancer; paternal first cousin  . Cancer Cousin        nose cancer; paternal first cousin   [name] has two sons (ages 3 and 101). She had three sisters. One sister died from lung cancer at the age of 57. Another sister has a history of multiple skin cancers, although [name] is not sure whether they were melanoma or a nonmelanoma skin cancer.  [name] mother died at age 37 and did not have cancer. Her mother had 10 paternal half-siblings and five maternal half-siblings. One of her mother's paternal half-sisters died from cancer in her late 16s (unknown type). One of her mother's maternal half-sisters died from liver cancer at the age of 86. [name] maternal grandmother died at age 43, and her maternal grandfather died at age 56. There are no other known diagnoses of cancer on the maternal side of the family.  [name] father died age 15 from metastatic prostate cancer (first diagnosed age 32). She had two paternal aunts and three paternal uncles. She had three female cousins with cancer - one had lung cancer, one had mouth cancer, and one had nose cancer. Her paternal grandmother died at age 15 and her paternal grandfather died at age 13. There are no other known diagnoses of cancer on the paternal side of the family.  Ms. He is unaware of previous family history of genetic testing for hereditary cancer risks. Patient's ancestors are of unknown descent. There is no reported Ashkenazi Jewish ancestry.  There is no known consanguinity.  GENETIC COUNSELING ASSESSMENT: Ms. Schlick is a 64 y.o. female with a personal history of bilateral breast cancer and a family history of metastatic prostate cancer, which is somewhat suggestive of a hereditary cancer syndrome and predisposition to cancer. We, therefore, discussed and recommended the following at today's visit.   DISCUSSION: We discussed that approximately 5-10% of breast cancer is hereditary, with most cases associated with the BRCA1 and BRCA2 genes. There are other genes that can be associated with hereditary breast cancer syndromes. These include ATM, CHEK2, PALB2, etc. We discussed that testing is beneficial for several reasons, including knowing about other  cancer risks, identifying potential screening and risk-reduction options that may be appropriate, and to understand if other family members could be at risk for cancer and allow them to undergo genetic testing.  We reviewed the characteristics, features and inheritance patterns of hereditary cancer syndromes. We also discussed genetic testing, including the appropriate family members to test, the process of testing, insurance coverage and turn-around-time for results. We discussed the implications of a negative, positive and/or variant of uncertain significant result. We recommended Ms. Reznick pursue genetic testing for the [facility] Common Hereditary Cancers panel.   The Common Hereditary Cancers Panel offered by [facility] includes sequencing and/or deletion duplication testing of the following 48 genes: APC, ATM, AXIN2, BARD1, BMPR1A, BRCA1, BRCA2, BRIP1, CDH1, CDK4, CDKN2A (p14ARF), CDKN2A (p16INK4a), CHEK2, CTNNA1, DICER1, EPCAM (Deletion/duplication testing only), GREM1 (promoter region deletion/duplication testing only), KIT, MEN1, MLH1, MSH2, MSH3, MSH6, MUTYH, NBN, NF1, NTHL1, PALB2, PDGFRA, PMS2, POLD1, POLE, PTEN, RAD50, RAD51C, RAD51D, RNF43, SDHB, SDHC, SDHD, SMAD4, SMARCA4. STK11, TP53, TSC1,  TSC2, and VHL.  The following genes are evaluated for sequence changes only: SDHA and HOXB13 c.251G>A variant only.  Based on Ms. Cottone personal and family history of cancer, she meets medical criteria for genetic testing. Despite that she meets criteria, she may still have an out of pocket cost. We discussed that if her out of pocket cost for testing is over $100, the laboratory will reach out to let her know. If the out of pocket cost of testing is less than $100 she will be billed by the genetic testing laboratory.   PLAN: After considering the risks, benefits, and limitations, Ms. Staszewski provided informed consent to pursue genetic testing and the blood sample was sent to [facility] for analysis of the Common Hereditary Cancers Panel. Results should be available within approximately two-three weeks' time, at which point they will be disclosed by telephone to Ms. Haag, as will any additional recommendations warranted by these results. Ms. Laine will receive a summary of her genetic counseling visit and a copy of her results once available. This information will also be available in Epic.   Ms. Pazos questions were answered to her satisfaction today. Our contact information was provided should additional questions or concerns arise. Thank you for the referral and allowing [place] to share in the care of your patient.   Clint Guy, [place], [facility] Licensed, Certified Dispensing [email or web address] Phone: [phone]  The patient was seen for a total of 25 minutes in face-to-face genetic counseling.  This patient was discussed with Drs. Magrinat, Lindi Adie and/or Burr Medico who agrees with the above.    _______________________________________________________________________ For Office Staff:  Number of people involved in session: 1 Was an Intern/ student involved with case: no

## 2020-04-14 NOTE — Progress Notes (Signed)
[place]  [phone] [phone]     ID: Christine Mosley DOB: May 01, 1956  MR#: [id number]  LPN#:[id number]  Patient Care Team: [name], PA-C as PCP - General (Family Medicine) Mauro Kaufmann, RN as Oncology Nurse Navigator Rockwell Germany, RN as Oncology Nurse Navigator Rolm Bookbinder, MD as Consulting Physician (General Surgery) Paxton Kanaan, Virgie Dad, MD as Consulting Physician (Oncology) Eppie Gibson, MD as Attending Physician (Radiation Oncology) Maisie Fus, MD as Consulting Physician (Obstetrics and Gynecology) Sydnee Cabal, MD as Consulting Physician (Orthopedic Surgery) Chauncey Cruel, MD OTHER MD:  CHIEF COMPLAINT: Estrogen receptor positive invasive breast cancer  CURRENT TREATMENT: Definitive surgery pending   HISTORY OF CURRENT ILLNESS: Christine Mosley has a history of stage pT2, pN0 right breast cancer, status post right lumpectomy and sentinel lymph node biopsy on 12/08/2003 showing: invasive ductal carcinoma, grade 2, 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 Estrogen and progesterone receptor both positive, and Her2 negative by FISH. Both axillary lymph nodes were negative for metastasis (0/2). She was treated with chemotherapy, radiation therapy, and tamoxifen. I last saw here in 06/2010 when she "graduated" from follow up.  More recently, she had routine screening mammography on 03/22/2020 showing a possible abnormality in the left breast. She underwent left diagnostic mammography with tomography and left breast ultrasonography at [facility] on 04/05/2020 showing: breast density category B; 7 mm mass at 11:30 in left breast; normal-appearing left axillary lymph nodes.  Accordingly on 04/06/2020 she proceeded to biopsy of the left breast area in question. The pathology from this procedure ([id number]) showed: invasive and in situ mammary carcinoma, e-cadherin positive, grade 2;  calcifications. Prognostic indicators significant for: estrogen receptor, 95% positive and progesterone receptor, 2% positive, both with moderate staining intensity. Proliferation marker Ki67 at 5%. HER2 equivocal by immunohistochemistry (2+), but negative by fluorescent in situ hybridization with a signals ratio 1.23 and number per cell 1.9.  [name] subsequent history is as detailed below.   INTERVAL HISTORY: Christine Mosley was evaluated in the multidisciplinary breast cancer clinic on 04/14/2020 accompanied by her sister. Her case was also presented at the multidisciplinary breast cancer conference on the same day. At that time a preliminary plan was proposed: Breast conserving surgery with sentinel lymph node sampling, genetics testing, Oncotype testing, adjuvant radiation, antiestrogens   REVIEW OF SYSTEMS: There were no specific symptoms leading to the original mammogram, which was routinely scheduled. On the provided questionnaire, Christine Mosley reports fatigue, wearing reading glasses sometimes, hearing loss requiring a hearing aid, sinus problems with associated headaches, shortness of breath with climbing stairs and walking, heartburn, incontinence, some right breast pain, joint pain and arthritis, and hot flashes. The patient denies unusual headaches, visual changes, nausea, vomiting, stiff neck, dizziness, or gait imbalance. There has been no cough, phlegm production, or pleurisy, no chest pain or pressure, and no change in bowel or bladder habits. The patient denies fever, rash, bleeding, unexplained fatigue or unexplained weight loss. A detailed review of systems was otherwise entirely negative.   PAST MEDICAL HISTORY: Past Medical History:  Diagnosis Date  . Breast cancer ([place])   . GERD (gastroesophageal reflux disease)   . Personal history of chemotherapy   . Personal history of radiation therapy     PAST SURGICAL HISTORY: Past Surgical History:  Procedure Laterality Date  . ABDOMINAL  HYSTERECTOMY    . BREAST LUMPECTOMY Right 2005  . SHOULDER SURGERY      FAMILY HISTORY: Family History  Problem Relation Age of Onset  . Congestive Heart Failure Mother   . Prostate cancer Father 20  . Lung cancer Sister 78   Her father died at age 49 from prostate cancer, which was diagnosed at age 69. Her mother died at age 72 from CHF. Christine Mosley has 2 sisters (and 0 brothers). She reports lung cancer in her sister, diagnosed at age 70; she succumbed to this disease.   GYNECOLOGIC HISTORY:  No LMP recorded. Patient has had a hysterectomy. Menarche: age unsure Age at first live birth: 64 years old [place] P 2 LMP 2005 Contraceptive: never used HRT never used  Hysterectomy? Yes, 01/2008, for uterine prolapse BSO? Yes (see report [place] is [phone]).  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 Husband Arvin "Tommie Raymond" is a dye cutter. She lives at home with husband Tommie Raymond and son Ovid Curd, who is 75. Son Christia Reading, age 37, is a Charity fundraiser in [place]. Jessenia has one granddaughter, age 9. She attends a [employer].    ADVANCED DIRECTIVES: In the absence of any documentation to the contrary, the patient's spouse is their HCPOA.    HEALTH MAINTENANCE: Social History   Tobacco Use  . Smoking status: Never Smoker  . Smokeless tobacco: Never Used  Substance Use Topics  . Alcohol use: Never  . Drug use: Never     Colonoscopy: never done, Cologuard in 2020  PAP: 03/2020, negative  Bone density: yes, date unknown   Allergies  Allergen Reactions  . Codeine Nausea And Vomiting    No current outpatient medications on file.   No current facility-administered medications for this visit.    OBJECTIVE: White woman in no acute distress  Vitals:   04/14/20 0900  BP: 117/67  Pulse: 88  Resp: 18  Temp: 97.9 F (36.6 C)  SpO2: 98%     Body mass index is 30.09 kg/m.   Wt Readings from Last 3 Encounters:  04/14/20 180 lb 12.8 oz (82 kg)      ECOG FS:1 -  Symptomatic but completely ambulatory  Ocular: Sclerae unicteric, pupils round and equal Ear-nose-throat: Wearing a mask Lymphatic: No cervical or supraclavicular adenopathy Lungs no rales or rhonchi Heart regular rate and rhythm Abd soft, nontender, positive bowel sounds MSK no focal spinal tenderness, no joint edema Neuro: non-focal, well-oriented, appropriate affect Breasts: The right breast is status post remote lumpectomy and radiation.  There is no evidence of local recurrence.  The left breast is status post recent biopsy.  There is a minimal ecchymosis.  Both axillae are benign.   LAB RESULTS:  CMP     Component Value Date/Time   NA 140 04/14/2020 0824   K 4.2 04/14/2020 0824   CL 104 04/14/2020 0824   CO2 29 04/14/2020 0824   GLUCOSE 192 (H) 04/14/2020 0824   BUN 9 04/14/2020 0824   CREATININE 0.94 04/14/2020 0824   CALCIUM 10.4 (H) 04/14/2020 0824   PROT 7.8 04/14/2020 0824   ALBUMIN 4.2 04/14/2020 0824   AST 14 (L) 04/14/2020 0824   ALT 15 04/14/2020 0824   ALKPHOS 67 04/14/2020 0824   BILITOT 0.8 04/14/2020 0824   GFRNONAA >60 04/14/2020 0824   GFRAA >60 04/14/2020 0824    No results found for: Ronnald Ramp, A1GS, A2GS, BETS, BETA2SER, GAMS, MSPIKE, SPEI  Lab Results  Component Value Date   WBC 7.4 04/14/2020   NEUTROABS 4.8 04/14/2020   HGB 14.5 04/14/2020   HCT 43.4 04/14/2020   MCV 87.0 04/14/2020  PLT 274 04/14/2020    Lab Results  Component Value Date   LABCA2 17 07/04/2010    No components found for: KCLEXN170  No results for input(s): INR in the last 168 hours.  Lab Results  Component Value Date   LABCA2 17 07/04/2010    No results found for: YFV494  No results found for: WHQ759  No results found for: FMB846  No results found for: CA2729  No components found for: HGQUANT  No results found for: CEA1 / No results found for: CEA1   No results found for: AFPTUMOR  No results found for: CHROMOGRNA  No results  found for: KPAFRELGTCHN, LAMBDASER, KAPLAMBRATIO (kappa/lambda light chains)  No results found for: HGBA, HGBA2QUANT, HGBFQUANT, HGBSQUAN (Hemoglobinopathy evaluation)   Lab Results  Component Value Date   LDH 148 10/09/2006    No results found for: IRON, TIBC, IRONPCTSAT (Iron and TIBC)  No results found for: FERRITIN  Urinalysis    Component Value Date/Time   COLORURINE YELLOW 01/31/2008 [place] 01/31/2008 1217   LABSPEC 1.015 01/31/2008 1217   PHURINE 6.0 01/31/2008 1217   GLUCOSEU NEGATIVE 01/31/2008 1217   HGBUR NEGATIVE 01/31/2008 1217   BILIRUBINUR NEGATIVE 01/31/2008 1217   KETONESUR NEGATIVE 01/31/2008 1217   PROTEINUR NEGATIVE 01/31/2008 1217   UROBILINOGEN 0.2 01/31/2008 1217   NITRITE NEGATIVE 01/31/2008 1217   LEUKOCYTESUR  01/31/2008 1217    NEGATIVE MICROSCOPIC NOT DONE ON URINES WITH NEGATIVE PROTEIN, BLOOD, LEUKOCYTES, NITRITE, OR GLUCOSE <1000 mg/dL.     STUDIES: US BREAST LTD UNI LEFT INC AXILLA  Addendum Date: 04/07/2020   ADDENDUM REPORT: 04/07/2020 12:21 ADDENDUM: The last sentence of the findings section of the report should read as follows: Ultrasound of the left axilla demonstrates normal appearing left axillary lymph nodes. Electronically Signed   By: Claudie Revering M.D.   On: 04/07/2020 12:21   Result Date: 04/07/2020 CLINICAL DATA:  Possible mass in the posterior 12 o'clock position of the left breast on a recent screening mammogram. EXAM: DIGITAL DIAGNOSTIC LEFT MAMMOGRAM WITH TOMO ULTRASOUND LEFT BREAST COMPARISON:  Previous exam(s). ACR Breast Density Category b: There are scattered areas of fibroglandular density. FINDINGS: 3D tomographic and 2D generated spot compression views of the left breast demonstrate a small, rounded mass with irregular margins and mild spiculation in the posterior 12 o'clock position of the breast. This contains tiny calcifications, some arranged in a linear fashion. Targeted ultrasound is performed,  showing a 7 x 6 x 6 mm oval, mildly irregular mass with indistinct margins in the posterior left breast in the 11:30 o'clock position, 6 cm from the nipple. This corresponds to the mammographic mass. Ultrasound of the left axilla demonstrates appearing left axillary lymph nodes. IMPRESSION: 7 mm mass with imaging features highly suspicious for malignancy in the 11:30 o'clock position of the left breast. RECOMMENDATION: Ultrasound-guided core needle biopsy of the 7 mm mass in the 11:30 o'clock position of the left breast. This has been discussed with the patient and scheduled at 12:45 p.m. on 04/07/1999. I have discussed the findings and recommendations with the patient. If applicable, a reminder letter will be sent to the patient regarding the next appointment. BI-RADS CATEGORY  5: Highly suggestive of malignancy. Electronically Signed: By: Claudie Revering M.D. On: 04/05/2020 11:43   MM DIAG BREAST TOMO UNI LEFT  Addendum Date: 04/07/2020   ADDENDUM REPORT: 04/07/2020 12:21 ADDENDUM: The last sentence of the findings section of the report should read as follows: Ultrasound of the  left axilla demonstrates normal appearing left axillary lymph nodes. Electronically Signed   By: Claudie Revering M.D.   On: 04/07/2020 12:21   Result Date: 04/07/2020 CLINICAL DATA:  Possible mass in the posterior 12 o'clock position of the left breast on a recent screening mammogram. EXAM: DIGITAL DIAGNOSTIC LEFT MAMMOGRAM WITH TOMO ULTRASOUND LEFT BREAST COMPARISON:  Previous exam(s). ACR Breast Density Category b: There are scattered areas of fibroglandular density. FINDINGS: 3D tomographic and 2D generated spot compression views of the left breast demonstrate a small, rounded mass with irregular margins and mild spiculation in the posterior 12 o'clock position of the breast. This contains tiny calcifications, some arranged in a linear fashion. Targeted ultrasound is performed, showing a 7 x 6 x 6 mm oval, mildly irregular mass with  indistinct margins in the posterior left breast in the 11:30 o'clock position, 6 cm from the nipple. This corresponds to the mammographic mass. Ultrasound of the left axilla demonstrates appearing left axillary lymph nodes. IMPRESSION: 7 mm mass with imaging features highly suspicious for malignancy in the 11:30 o'clock position of the left breast. RECOMMENDATION: Ultrasound-guided core needle biopsy of the 7 mm mass in the 11:30 o'clock position of the left breast. This has been discussed with the patient and scheduled at 12:45 p.m. on 04/07/1999. I have discussed the findings and recommendations with the patient. If applicable, a reminder letter will be sent to the patient regarding the next appointment. BI-RADS CATEGORY  5: Highly suggestive of malignancy. Electronically Signed: By: Claudie Revering M.D. On: 04/05/2020 11:43   MM 3D SCREEN BREAST BILATERAL  Result Date: 03/23/2020 CLINICAL DATA:  Screening. EXAM: DIGITAL SCREENING BILATERAL MAMMOGRAM WITH TOMO AND CAD COMPARISON:  Previous exam(s). ACR Breast Density Category b: There are scattered areas of fibroglandular density. FINDINGS: In the left breast, a possible mass warrants further evaluation. In the right breast, no findings suspicious for malignancy. Postsurgical changes are seen in the right breast. Images were processed with CAD. IMPRESSION: Further evaluation is suggested for possible mass in the left breast. RECOMMENDATION: Diagnostic mammogram and possibly ultrasound of the left breast. (Code:[id number]) The patient will be contacted regarding the findings, and additional imaging will be scheduled. BI-RADS CATEGORY  0: Incomplete. Need additional imaging evaluation and/or prior mammograms for comparison. Electronically Signed   By: Zerita Boers M.D.   On: 03/23/2020 17:03   MM CLIP PLACEMENT LEFT  Result Date: 04/06/2020 CLINICAL DATA:  Evaluate RIBBON biopsy marking clip position following ultrasound-guided LEFT breast biopsy. EXAM:  DIAGNOSTIC LEFT MAMMOGRAM POST ULTRASOUND BIOPSY COMPARISON:  Previous exam(s). FINDINGS: Mammographic images were obtained following ultrasound guided biopsy of the 0.7 cm mass at the 11:30 position of the LEFT breast. The RIBBON biopsy marking clip is in expected position at the site of biopsy. IMPRESSION: Appropriate positioning of the RIBBON shaped biopsy marking clip at the site of biopsy in the UPPER LEFT breast. Final Assessment: Post Procedure Mammograms for Marker Placement Electronically Signed   By: Margarette Canada M.D.   On: 04/06/2020 13:18   [place] LT BREAST BX W LOC DEV 1ST LESION IMG BX SPEC US GUIDE  Addendum Date: 04/07/2020   ADDENDUM REPORT: 04/07/2020 12:36 ADDENDUM: Pathology revealed GRADE II INVASIVE MAMMARY CARCINOMA, MAMMARY CARCINOMA IN SITU, CALCIFICATIONS of the LEFT breast, upper inner. This was found to be concordant by Dr. Hassan Rowan. Pathology results were discussed with the patient by telephone. The patient reported doing well after the biopsy with tenderness at the site. Post biopsy instructions and care were  reviewed and questions were answered. The patient was encouraged to call [facility] for any additional concerns. The patient was referred to [facility] on April 14, 2020. Pathology results reported by Stacie Acres RN on 04/07/2020. Electronically Signed   By: Margarette Canada M.D.   On: 04/07/2020 12:36   Result Date: 04/07/2020 CLINICAL DATA:  64 year old female for tissue sampling of 0.7 cm UPPER INNER LEFT breast mass. EXAM: ULTRASOUND GUIDED LEFT BREAST CORE NEEDLE BIOPSY COMPARISON:  Previous exam(s). PROCEDURE: I met with the patient and we discussed the procedure of ultrasound-guided biopsy, including benefits and alternatives. We discussed the high likelihood of a successful procedure. We discussed the risks of the procedure, including infection, bleeding, tissue injury, clip  migration, and inadequate sampling. Informed written consent was given. The usual time-out protocol was performed immediately prior to the procedure. Lesion quadrant: UPPER INNER LEFT breast Using sterile technique and 1% Lidocaine as local anesthetic, under direct ultrasound visualization, a 12 gauge spring-loaded device was used to perform biopsy of 0.7 cm mass at the 11:30 position of the LEFT breast 6 cm from the nipple using a MEDIAL approach. At the conclusion of the procedure a RIBBON tissue marker clip was deployed into the biopsy cavity. Follow up 2 view mammogram was performed and dictated separately. IMPRESSION: Ultrasound guided biopsy of 0.7 cm UPPER INNER LEFT breast mass. No apparent complications. Electronically Signed: By: Margarette Canada M.D. On: 04/06/2020 13:16     ELIGIBLE FOR AVAILABLE RESEARCH PROTOCOL: AET  ASSESSMENT: 64 y.o. [place] woman  (1) status post right breast upper outer quadrant lumpectomy 04 19 2005 for a pT2 pN0, stage IB invasive breast cancer, estrogen and progesterone receptor positive, HER-2 not amplified  (a) a total of 2 right sentinel lymph nodes were removed  (b) status post adjuvant chemotherapy  (c) status post adjuvant radiation  (d) status post tamoxifen x5+ years  (2) status post left breast upper inner quadrant biopsy 04/06/2020 for a clinical T1b N0, stage IA invasive ductal carcinoma, grade 2, E-cadherin positive, estrogen and progesterone receptor positive, HER-2 not amplified, with an MIB-1 of 5%  (3) definitive surgery pending  (4) Oncotype to be obtained from the definitive surgical sample: Chemotherapy not anticipated  (5) adjuvant radiation  (6) genetics testing  (7) antiestrogens    PLAN: I met today with Christine Mosley to review her new diagnosis. Specifically we discussed the biology of her breast cancer, its diagnosis, staging, treatment  options and prognosis. We first reviewed the fact that cancer is not one disease but more than  100 different diseases and that it is important to keep them separate-- otherwise when friends and relatives discuss their own cancer experiences with Christine Mosley confusion can result. Similarly we explained that if breast cancer spreads to the bone or liver, the patient would not have bone cancer or liver cancer, but breast cancer in the bone and breast cancer in the liver: one cancer in three places-- not 3 different cancers which otherwise would have to be treated in 3 different ways.  We discussed the difference between local and systemic therapy. In terms of loco-regional treatment, lumpectomy plus radiation is equivalent to mastectomy as far as survival is concerned. For this reason, and because the cosmetic results are generally superior, we recommend breast conserving surgery.  We then discussed the rationale for systemic therapy. There is some risk that this cancer may have already spread to  other parts of her body. Patients frequently ask at this point about bone scans, CAT scans and PET scans to find out if they have occult breast cancer somewhere else. The problem is that in early stage disease we are much more likely to find false positives then true cancers and this would expose the patient to unnecessary procedures as well as unnecessary radiation. Scans cannot answer the question the patient really would like to know, which is whether she has microscopic disease elsewhere in her body. For those reasons we do not recommend them.  Of course we would proceed to aggressive evaluation of any symptoms that might suggest metastatic disease, but that is not the case here.  Next we went over the options for systemic therapy which are anti-estrogens, anti-HER-2 immunotherapy, and chemotherapy. Christine Mosley does not meet criteria for anti-HER-2 immunotherapy. She is a good candidate for anti-estrogens.  The question of chemotherapy is more complicated. Chemotherapy is most effective in rapidly growing,  aggressive tumors. It is much less effective in lower-grade, slow growing cancers, like Christine Mosley 's.  Given that and the very small size of the tumor clinically we considered not sending an Oncotype but in the end decided to request an Oncotype from the definitive surgical sample, as suggested by NCCN guidelines.  The patient understands we do not anticipate she will need chemotherapy to have a very good prognosis  Christine Mosley does qualify for genetics testing. In patients who carry a deleterious mutation [for example in a  BRCA gene], the risk of a new breast cancer developing in the future may be sufficiently great that the patient may choose bilateral mastectomies. However if she wishes to keep her breasts in that situation it is safe to do so. That would require intensified screening, which generally means not only yearly mammography but a yearly breast MRI as well.   Christine Mosley has a good understanding of the overall plan. She agrees with it. She knows the goal of treatment in her case is cure. She will call with any problems that may develop before her next visit here.  Total encounter time 65 minutes.Sarajane Jews C. Issak Goley, MD 04/14/2020 11:37 AM Medical Oncology and Hematology [facility], [place] [address] Tel. [phone]    Fax. [phone]   This document serves as a record of services personally performed by Lurline Del, MD. It was created on his behalf by Wilburn Mylar, a trained medical scribe. The creation of this record is based on the scribe's personal observations and the provider's statements to them.   I, Lurline Del MD, have reviewed the above documentation for accuracy and completeness, and I agree with the above.    *Total Encounter Time as defined by the Centers for Medicare and Medicaid Services includes, in addition to the face-to-face time of a patient visit (documented in the note above) non-face-to-face time: obtaining and  reviewing outside history, ordering and reviewing medications, tests or procedures, care coordination (communications with other health care professionals or caregivers) and documentation in the medical record.

## 2020-04-14 NOTE — Progress Notes (Signed)
Radiation Oncology         [phone]) [phone] ________________________________  Initial outpatient Consultation  Name: Christine Mosley MRN: [id number]  Date: 04/14/2020  DOB: [birth year]  GD:JMEQAS, Baldemar Friday., PA-C  Rolm Bookbinder, MD   REFERRING PHYSICIAN: Rolm Bookbinder, MD  DIAGNOSIS:    ICD-10-CM   1. Malignant neoplasm of upper-inner quadrant of left breast in female, estrogen receptor positive [facility])  C50.212    Z17.0    Cancer Staging Malignant neoplasm of upper-inner quadrant of left breast in female, estrogen receptor positive ([place]) Staging form: Breast, AJCC 8th Edition - Clinical stage from 04/14/2020: Stage IA (cT1b, cN0, cM0, G2, ER+, PR+, HER2-) - Unsigned   CHIEF COMPLAINT: Here to discuss management of left breast cancer  HISTORY OF PRESENT ILLNESS::Christine Mosley is a 64 y.o. female with a history of right breast cancer diagnosed in 2005. She was treated with lumpectomy, chemotherapy, and radiation therapy.  She also received tamoxifen. More recently, she presented with breast abnormality on the following imaging: screening mammogram on the date of 03/22/2020.  Symptoms, if any, at that time, were: none.   Ultrasound of breast on 04/05/2020 revealed 7 mm left breast mass at 11:30.   Biopsy on date of 04/06/2020 showed invasive and in situ carcinoma, e-cadherin positive (ductal).  ER status: 95%; PR status 2%, Her2 status negative; Grade 2.  She is in her usual state of health.  She is here with a family member who appears very supportive and lives in [place].  The patient lives in [place] but would like to receive all of her treatment in [place].Marland Kitchen  PREVIOUS RADIATION THERAPY: Yes  2005: Right Breast (Dr. Danny Lawless)  PAST MEDICAL HISTORY:  has a past medical history of Breast cancer ([place]), GERD (gastroesophageal reflux disease), Personal history of chemotherapy, and Personal history of radiation therapy.    PAST SURGICAL HISTORY: Past Surgical History:    Procedure Laterality Date  . ABDOMINAL HYSTERECTOMY    . BREAST LUMPECTOMY Right 2005  . SHOULDER SURGERY      FAMILY HISTORY: family history includes Congestive Heart Failure in her mother; Lung cancer (age of onset: 66) in her sister; Prostate cancer (age of onset: 23) in her father.  SOCIAL HISTORY:  reports that she has never smoked. She has never used smokeless tobacco. She reports that she does not drink alcohol and does not use drugs.  ALLERGIES: Codeine  MEDICATIONS:  No current outpatient medications on file.   No current facility-administered medications for this encounter.    REVIEW OF SYSTEMS: As above   PHYSICAL EXAM:  vitals were not taken for this visit.   General: Alert and oriented, in no acute distress Breasts: There is a 1 to 2 cm mass in the upper inner quadrant of the left breast consistent with biopsy changes. No other palpable masses appreciated in the breasts or axillae bilaterally.  Status post right lumpectomy with well-healed scar.   ECOG = 1  0 - Asymptomatic (Fully active, able to carry on all predisease activities without restriction)  1 - Symptomatic but completely ambulatory (Restricted in physically strenuous activity but ambulatory and able to carry out work of a light or sedentary nature. For example, light housework, office work)  2 - Symptomatic, <50% in bed during the day (Ambulatory and capable of all self care but unable to carry out any work activities. Up and about more than 50% of waking hours)  3 - Symptomatic, >50% in bed, but not bedbound (Capable of only  limited self-care, confined to bed or chair 50% or more of waking hours)  4 - Bedbound (Completely disabled. Cannot carry on any self-care. Totally confined to bed or chair)  5 - Death   Eustace Pen MM, Creech RH, Tormey DC, et al. [phone]). "Toxicity and response criteria of the [facility] Group". Schneider Oncol. 5 (6): 649-55   LABORATORY DATA:  Lab Results   Component Value Date   WBC 7.4 04/14/2020   HGB 14.5 04/14/2020   HCT 43.4 04/14/2020   MCV 87.0 04/14/2020   PLT 274 04/14/2020   CMP     Component Value Date/Time   NA 140 04/14/2020 0824   K 4.2 04/14/2020 0824   CL 104 04/14/2020 0824   CO2 29 04/14/2020 0824   GLUCOSE 192 (H) 04/14/2020 0824   BUN 9 04/14/2020 0824   CREATININE 0.94 04/14/2020 0824   CALCIUM 10.4 (H) 04/14/2020 0824   PROT 7.8 04/14/2020 0824   ALBUMIN 4.2 04/14/2020 0824   AST 14 (L) 04/14/2020 0824   ALT 15 04/14/2020 0824   ALKPHOS 67 04/14/2020 0824   BILITOT 0.8 04/14/2020 0824   GFRNONAA >60 04/14/2020 0824   GFRAA >60 04/14/2020 0824         RADIOGRAPHY: US BREAST LTD UNI LEFT INC AXILLA  Addendum Date: 04/07/2020   ADDENDUM REPORT: 04/07/2020 12:21 ADDENDUM: The last sentence of the findings section of the report should read as follows: Ultrasound of the left axilla demonstrates normal appearing left axillary lymph nodes. Electronically Signed   By: Claudie Revering M.D.   On: 04/07/2020 12:21   Result Date: 04/07/2020 CLINICAL DATA:  Possible mass in the posterior 12 o'clock position of the left breast on a recent screening mammogram. EXAM: DIGITAL DIAGNOSTIC LEFT MAMMOGRAM WITH TOMO ULTRASOUND LEFT BREAST COMPARISON:  Previous exam(s). ACR Breast Density Category b: There are scattered areas of fibroglandular density. FINDINGS: 3D tomographic and 2D generated spot compression views of the left breast demonstrate a small, rounded mass with irregular margins and mild spiculation in the posterior 12 o'clock position of the breast. This contains tiny calcifications, some arranged in a linear fashion. Targeted ultrasound is performed, showing a 7 x 6 x 6 mm oval, mildly irregular mass with indistinct margins in the posterior left breast in the 11:30 o'clock position, 6 cm from the nipple. This corresponds to the mammographic mass. Ultrasound of the left axilla demonstrates appearing left axillary lymph  nodes. IMPRESSION: 7 mm mass with imaging features highly suspicious for malignancy in the 11:30 o'clock position of the left breast. RECOMMENDATION: Ultrasound-guided core needle biopsy of the 7 mm mass in the 11:30 o'clock position of the left breast. This has been discussed with the patient and scheduled at 12:45 p.m. on 04/07/1999. I have discussed the findings and recommendations with the patient. If applicable, a reminder letter will be sent to the patient regarding the next appointment. BI-RADS CATEGORY  5: Highly suggestive of malignancy. Electronically Signed: By: Claudie Revering M.D. On: 04/05/2020 11:43   MM DIAG BREAST TOMO UNI LEFT  Addendum Date: 04/07/2020   ADDENDUM REPORT: 04/07/2020 12:21 ADDENDUM: The last sentence of the findings section of the report should read as follows: Ultrasound of the left axilla demonstrates normal appearing left axillary lymph nodes. Electronically Signed   By: Claudie Revering M.D.   On: 04/07/2020 12:21   Result Date: 04/07/2020 CLINICAL DATA:  Possible mass in the posterior 12 o'clock position of the left breast on a recent screening  mammogram. EXAM: DIGITAL DIAGNOSTIC LEFT MAMMOGRAM WITH TOMO ULTRASOUND LEFT BREAST COMPARISON:  Previous exam(s). ACR Breast Density Category b: There are scattered areas of fibroglandular density. FINDINGS: 3D tomographic and 2D generated spot compression views of the left breast demonstrate a small, rounded mass with irregular margins and mild spiculation in the posterior 12 o'clock position of the breast. This contains tiny calcifications, some arranged in a linear fashion. Targeted ultrasound is performed, showing a 7 x 6 x 6 mm oval, mildly irregular mass with indistinct margins in the posterior left breast in the 11:30 o'clock position, 6 cm from the nipple. This corresponds to the mammographic mass. Ultrasound of the left axilla demonstrates appearing left axillary lymph nodes. IMPRESSION: 7 mm mass with imaging features highly  suspicious for malignancy in the 11:30 o'clock position of the left breast. RECOMMENDATION: Ultrasound-guided core needle biopsy of the 7 mm mass in the 11:30 o'clock position of the left breast. This has been discussed with the patient and scheduled at 12:45 p.m. on 04/07/1999. I have discussed the findings and recommendations with the patient. If applicable, a reminder letter will be sent to the patient regarding the next appointment. BI-RADS CATEGORY  5: Highly suggestive of malignancy. Electronically Signed: By: Claudie Revering M.D. On: 04/05/2020 11:43   MM 3D SCREEN BREAST BILATERAL  Result Date: 03/23/2020 CLINICAL DATA:  Screening. EXAM: DIGITAL SCREENING BILATERAL MAMMOGRAM WITH TOMO AND CAD COMPARISON:  Previous exam(s). ACR Breast Density Category b: There are scattered areas of fibroglandular density. FINDINGS: In the left breast, a possible mass warrants further evaluation. In the right breast, no findings suspicious for malignancy. Postsurgical changes are seen in the right breast. Images were processed with CAD. IMPRESSION: Further evaluation is suggested for possible mass in the left breast. RECOMMENDATION: Diagnostic mammogram and possibly ultrasound of the left breast. (Code:[id number]) The patient will be contacted regarding the findings, and additional imaging will be scheduled. BI-RADS CATEGORY  0: Incomplete. Need additional imaging evaluation and/or prior mammograms for comparison. Electronically Signed   By: Zerita Boers M.D.   On: 03/23/2020 17:03   MM CLIP PLACEMENT LEFT  Result Date: 04/06/2020 CLINICAL DATA:  Evaluate RIBBON biopsy marking clip position following ultrasound-guided LEFT breast biopsy. EXAM: DIAGNOSTIC LEFT MAMMOGRAM POST ULTRASOUND BIOPSY COMPARISON:  Previous exam(s). FINDINGS: Mammographic images were obtained following ultrasound guided biopsy of the 0.7 cm mass at the 11:30 position of the LEFT breast. The RIBBON biopsy marking clip is in expected position at the  site of biopsy. IMPRESSION: Appropriate positioning of the RIBBON shaped biopsy marking clip at the site of biopsy in the UPPER LEFT breast. Final Assessment: Post Procedure Mammograms for Marker Placement Electronically Signed   By: Margarette Canada M.D.   On: 04/06/2020 13:18   [place] LT BREAST BX W LOC DEV 1ST LESION IMG BX SPEC US GUIDE  Addendum Date: 04/07/2020   ADDENDUM REPORT: 04/07/2020 12:36 ADDENDUM: Pathology revealed GRADE II INVASIVE MAMMARY CARCINOMA, MAMMARY CARCINOMA IN SITU, CALCIFICATIONS of the LEFT breast, upper inner. This was found to be concordant by Dr. Hassan Rowan. Pathology results were discussed with the patient by telephone. The patient reported doing well after the biopsy with tenderness at the site. Post biopsy instructions and care were reviewed and questions were answered. The patient was encouraged to call [facility] for any additional concerns. The patient was referred to [facility] on April 14, 2020. Pathology results reported by Manuela Schwartz  Beulah Gandy RN on 04/07/2020. Electronically Signed   By: Margarette Canada M.D.   On: 04/07/2020 12:36   Result Date: 04/07/2020 CLINICAL DATA:  64 year old female for tissue sampling of 0.7 cm UPPER INNER LEFT breast mass. EXAM: ULTRASOUND GUIDED LEFT BREAST CORE NEEDLE BIOPSY COMPARISON:  Previous exam(s). PROCEDURE: I met with the patient and we discussed the procedure of ultrasound-guided biopsy, including benefits and alternatives. We discussed the high likelihood of a successful procedure. We discussed the risks of the procedure, including infection, bleeding, tissue injury, clip migration, and inadequate sampling. Informed written consent was given. The usual time-out protocol was performed immediately prior to the procedure. Lesion quadrant: UPPER INNER LEFT breast Using sterile technique and 1% Lidocaine as local anesthetic, under direct ultrasound  visualization, a 12 gauge spring-loaded device was used to perform biopsy of 0.7 cm mass at the 11:30 position of the LEFT breast 6 cm from the nipple using a MEDIAL approach. At the conclusion of the procedure a RIBBON tissue marker clip was deployed into the biopsy cavity. Follow up 2 view mammogram was performed and dictated separately. IMPRESSION: Ultrasound guided biopsy of 0.7 cm UPPER INNER LEFT breast mass. No apparent complications. Electronically Signed: By: Margarette Canada M.D. On: 04/06/2020 13:16      IMPRESSION/PLAN: Left Breast Cancer   She has been discussed at tumor board.  I personally reviewed her pathology and imaging and discussed her with the multidisciplinary team today.  It was a pleasure meeting the patient today. We discussed the risks, benefits, and side effects of radiotherapy. I recommend radiotherapy to the left breast to reduce her risk of locoregional recurrence by 2/3.  We discussed that radiation would take approximately 4 weeks to complete and that I would give the patient a few weeks to heal following surgery before starting treatment planning.  If chemotherapy were to be given (Oncotype test pending), this would precede radiotherapy. We spoke about acute effects including skin irritation and fatigue as well as much less common late effects including internal organ injury or irritation. We spoke about the latest technology that is used to minimize the risk of late effects for patients undergoing radiotherapy to the breast or chest wall. No guarantees of treatment were given. The patient is enthusiastic about proceeding with treatment.  We discussed measures to reduce the risk of infection during the COVID-19 pandemic.  She has not yet been vaccinated.  We discussed the risks benefits and side effects of the vaccine.  We also discussed the risks of Covid and the contagious nature of the delta variant. I highly recommended that she receive the vaccine as soon as possible.  The  patient does not feel ready to move forward with this.  I encouraged her to think about this and gave her information on how to schedule vaccine.   I look forward to participating in the patient's care.  I will await her referral back to me for postoperative follow-up and eventual CT simulation/treatment planning.   On date of service, in total, I spent 45 minutes on this encounter. Patient was seen in person.   __________________________________________   Eppie Gibson, MD   This document serves as a record of services personally performed by Eppie Gibson, MD. It was created on her behalf by Wilburn Mylar, a trained medical scribe. The creation of this record is based on the scribe's personal observations and the provider's statements to them. This document has been checked and approved by the attending provider.

## 2020-04-14 NOTE — Patient Instructions (Signed)

## 2020-04-15 ENCOUNTER — Other Ambulatory Visit: Payer: Self-pay | Admitting: Oncology

## 2020-04-15 ENCOUNTER — Telehealth: Payer: Self-pay | Admitting: Oncology

## 2020-04-15 ENCOUNTER — Telehealth: Payer: Self-pay | Admitting: *Deleted

## 2020-04-15 LAB — GENETIC SCREENING ORDER

## 2020-04-15 NOTE — Telephone Encounter (Signed)
Spoke to pt concerning [place] from 04/14/20. Denies questions or concerns regarding dx or treatment care plan. Encourage pt to call with needs. Received verbal understanding.

## 2020-04-15 NOTE — Telephone Encounter (Signed)
Called pt to schedule appts per 8/25 los. Pt declined scheduling appts at this time. Sent msg to [employer] and RN regarding pt's concerns.

## 2020-04-16 ENCOUNTER — Other Ambulatory Visit (HOSPITAL_COMMUNITY): Payer: BC Managed Care – PPO

## 2020-04-16 ENCOUNTER — Telehealth: Payer: Self-pay | Admitting: *Deleted

## 2020-04-16 ENCOUNTER — Encounter: Payer: Self-pay | Admitting: *Deleted

## 2020-04-16 ENCOUNTER — Other Ambulatory Visit: Payer: Self-pay | Admitting: General Surgery

## 2020-04-16 DIAGNOSIS — Z170 Estrogen receptor positive status [ER+]: Secondary | ICD-10-CM

## 2020-04-16 DIAGNOSIS — C50411 Malignant neoplasm of upper-outer quadrant of right female breast: Secondary | ICD-10-CM

## 2020-04-16 NOTE — Telephone Encounter (Signed)
Spoke to pt concerning appt with Dr. Jana Hakim. Scheduled and confirmed appt for 12/6 at 10am.

## 2020-04-20 ENCOUNTER — Other Ambulatory Visit: Payer: Self-pay

## 2020-04-20 ENCOUNTER — Encounter (HOSPITAL_BASED_OUTPATIENT_CLINIC_OR_DEPARTMENT_OTHER): Payer: Self-pay | Admitting: General Surgery

## 2020-04-21 NOTE — Progress Notes (Signed)
Nutrition  Patient identified by attending Breast Clinic on 04/14/2020.  Patient was given nutrition packet with RD contact information by nurse navigator.   Chart reviewed.   64 year old female with new diagnosis of left breast cancer.  Noted history of right breast cancer in 2005.  Planning lumpectomy, oncotype, radiation and antiestrogens.    Ht: 65 inches Wt: 180 lb BMI: 30  Patient currently is not at risk for malnutrition.  Please consult RD if changes in nutritional status occur.  Aunya Lemler B. Zenia Resides, [place], [place] Registered Dietitian [phone] (mobile)

## 2020-04-22 ENCOUNTER — Other Ambulatory Visit (HOSPITAL_COMMUNITY): Payer: BC Managed Care – PPO

## 2020-04-23 ENCOUNTER — Other Ambulatory Visit (HOSPITAL_COMMUNITY)
Admission: RE | Admit: 2020-04-23 | Discharge: 2020-04-23 | Disposition: A | Discharge status: Home / Self Care | Payer: BC Managed Care – PPO | Source: Ambulatory Visit | Attending: General Surgery | Admitting: General Surgery

## 2020-04-23 DIAGNOSIS — Z20822 Contact with and (suspected) exposure to covid-19: Secondary | ICD-10-CM | POA: Insufficient documentation

## 2020-04-23 DIAGNOSIS — Z01812 Encounter for preprocedural laboratory examination: Secondary | ICD-10-CM | POA: Insufficient documentation

## 2020-04-23 LAB — SARS CORONAVIRUS 2 (TAT 6-24 HRS): SARS Coronavirus 2: NEGATIVE

## 2020-04-23 MED ORDER — ENSURE PRE-SURGERY PO LIQD: 296.0000 mL | Freq: Once | ORAL | Status: DC

## 2020-04-23 NOTE — Progress Notes (Signed)

## 2020-04-27 ENCOUNTER — Encounter (HOSPITAL_COMMUNITY): Payer: BC Managed Care – PPO

## 2020-04-27 ENCOUNTER — Encounter (HOSPITAL_BASED_OUTPATIENT_CLINIC_OR_DEPARTMENT_OTHER): Payer: Self-pay | Admitting: General Surgery

## 2020-04-27 ENCOUNTER — Ambulatory Visit (HOSPITAL_BASED_OUTPATIENT_CLINIC_OR_DEPARTMENT_OTHER): Payer: BC Managed Care – PPO | Admitting: Certified Registered"

## 2020-04-27 ENCOUNTER — Encounter (HOSPITAL_BASED_OUTPATIENT_CLINIC_OR_DEPARTMENT_OTHER)
Admission: RE | Disposition: A | Discharge status: Home / Self Care | Payer: Self-pay | Source: Home / Self Care | Attending: General Surgery

## 2020-04-27 ENCOUNTER — Other Ambulatory Visit: Payer: Self-pay

## 2020-04-27 ENCOUNTER — Ambulatory Visit (HOSPITAL_BASED_OUTPATIENT_CLINIC_OR_DEPARTMENT_OTHER)
Admission: RE | Admit: 2020-04-27 | Discharge: 2020-04-27 | Disposition: A | Discharge status: Home / Self Care | Payer: BC Managed Care – PPO | Attending: General Surgery | Admitting: General Surgery

## 2020-04-27 ENCOUNTER — Ambulatory Visit
Admission: RE | Admit: 2020-04-27 | Discharge: 2020-04-27 | Disposition: A | Discharge status: Home / Self Care | Payer: BC Managed Care – PPO | Source: Ambulatory Visit | Attending: General Surgery | Admitting: General Surgery

## 2020-04-27 ENCOUNTER — Encounter (HOSPITAL_COMMUNITY)
Admission: RE | Admit: 2020-04-27 | Discharge: 2020-04-27 | Disposition: A | Discharge status: Home / Self Care | Payer: BC Managed Care – PPO | Source: Ambulatory Visit | Attending: General Surgery | Admitting: General Surgery

## 2020-04-27 DIAGNOSIS — Z8042 Family history of malignant neoplasm of prostate: Secondary | ICD-10-CM | POA: Insufficient documentation

## 2020-04-27 DIAGNOSIS — C50411 Malignant neoplasm of upper-outer quadrant of right female breast: Secondary | ICD-10-CM

## 2020-04-27 DIAGNOSIS — Z9221 Personal history of antineoplastic chemotherapy: Secondary | ICD-10-CM | POA: Diagnosis not present

## 2020-04-27 DIAGNOSIS — Z853 Personal history of malignant neoplasm of breast: Secondary | ICD-10-CM | POA: Insufficient documentation

## 2020-04-27 DIAGNOSIS — C773 Secondary and unspecified malignant neoplasm of axilla and upper limb lymph nodes: Secondary | ICD-10-CM | POA: Diagnosis not present

## 2020-04-27 DIAGNOSIS — Z79899 Other long term (current) drug therapy: Secondary | ICD-10-CM | POA: Diagnosis not present

## 2020-04-27 DIAGNOSIS — Z923 Personal history of irradiation: Secondary | ICD-10-CM | POA: Diagnosis not present

## 2020-04-27 DIAGNOSIS — Z170 Estrogen receptor positive status [ER+]: Secondary | ICD-10-CM

## 2020-04-27 DIAGNOSIS — Z8249 Family history of ischemic heart disease and other diseases of the circulatory system: Secondary | ICD-10-CM | POA: Insufficient documentation

## 2020-04-27 DIAGNOSIS — Z90710 Acquired absence of both cervix and uterus: Secondary | ICD-10-CM | POA: Diagnosis not present

## 2020-04-27 DIAGNOSIS — Z6830 Body mass index (BMI) 30.0-30.9, adult: Secondary | ICD-10-CM | POA: Insufficient documentation

## 2020-04-27 DIAGNOSIS — C50412 Malignant neoplasm of upper-outer quadrant of left female breast: Secondary | ICD-10-CM | POA: Diagnosis not present

## 2020-04-27 DIAGNOSIS — E669 Obesity, unspecified: Secondary | ICD-10-CM | POA: Insufficient documentation

## 2020-04-27 HISTORY — PX: BREAST LUMPECTOMY WITH RADIOACTIVE SEED AND SENTINEL LYMPH NODE BIOPSY: SHX6550

## 2020-04-27 HISTORY — PX: BREAST LUMPECTOMY: SHX2

## 2020-04-27 SURGERY — BREAST LUMPECTOMY WITH RADIOACTIVE SEED AND SENTINEL LYMPH NODE BIOPSY
Anesthesia: General | Site: Breast | Laterality: Left

## 2020-04-27 MED ORDER — PROMETHAZINE HCL 25 MG/ML IJ SOLN: 6.2500 mg | INTRAMUSCULAR | Status: DC | PRN

## 2020-04-27 MED ORDER — CEFAZOLIN SODIUM-DEXTROSE 2-4 GM/100ML-% IV SOLN
2.0000 g | INTRAVENOUS | Status: AC
  Administered 2020-04-27: 2 g via INTRAVENOUS

## 2020-04-27 MED ORDER — GABAPENTIN 100 MG PO CAPS
ORAL_CAPSULE | ORAL | Status: AC
  Filled 2020-04-27: qty 1

## 2020-04-27 MED ORDER — TRAMADOL HCL 50 MG PO TABS: 100.0000 mg | ORAL_TABLET | Freq: Four times a day (QID) | ORAL | 0 refills | Status: DC | PRN

## 2020-04-27 MED ORDER — LACTATED RINGERS IV SOLN: INTRAVENOUS | Status: DC

## 2020-04-27 MED ORDER — PROPOFOL 10 MG/ML IV BOLUS
INTRAVENOUS | Status: DC | PRN
  Administered 2020-04-27: 200 mg via INTRAVENOUS

## 2020-04-27 MED ORDER — PHENYLEPHRINE 40 MCG/ML (10ML) SYRINGE FOR IV PUSH (FOR BLOOD PRESSURE SUPPORT)
PREFILLED_SYRINGE | INTRAVENOUS | Status: DC | PRN
  Administered 2020-04-27 (×2): 80 ug via INTRAVENOUS

## 2020-04-27 MED ORDER — ROPIVACAINE HCL 5 MG/ML IJ SOLN
INTRAMUSCULAR | Status: DC | PRN
  Administered 2020-04-27: 30 mL via PERINEURAL

## 2020-04-27 MED ORDER — MEPERIDINE HCL 25 MG/ML IJ SOLN: 6.2500 mg | INTRAMUSCULAR | Status: DC | PRN

## 2020-04-27 MED ORDER — DEXAMETHASONE SODIUM PHOSPHATE 10 MG/ML IJ SOLN
INTRAMUSCULAR | Status: DC | PRN
  Administered 2020-04-27: 5 mg via INTRAVENOUS

## 2020-04-27 MED ORDER — ACETAMINOPHEN 500 MG PO TABS
1000.0000 mg | ORAL_TABLET | ORAL | Status: AC
  Administered 2020-04-27: 1000 mg via ORAL

## 2020-04-27 MED ORDER — FENTANYL CITRATE (PF) 100 MCG/2ML IJ SOLN
INTRAMUSCULAR | Status: AC
  Filled 2020-04-27: qty 2

## 2020-04-27 MED ORDER — ONDANSETRON HCL 4 MG/2ML IJ SOLN
INTRAMUSCULAR | Status: AC
  Filled 2020-04-27: qty 2

## 2020-04-27 MED ORDER — OXYCODONE HCL 5 MG/5ML PO SOLN: 5.0000 mg | Freq: Once | ORAL | Status: DC | PRN

## 2020-04-27 MED ORDER — ONDANSETRON HCL 4 MG/2ML IJ SOLN
INTRAMUSCULAR | Status: DC | PRN
  Administered 2020-04-27: 4 mg via INTRAVENOUS

## 2020-04-27 MED ORDER — ACETAMINOPHEN 500 MG PO TABS
ORAL_TABLET | ORAL | Status: AC
  Filled 2020-04-27: qty 2

## 2020-04-27 MED ORDER — CEFAZOLIN SODIUM 1 G IJ SOLR
INTRAMUSCULAR | Status: AC
  Filled 2020-04-27: qty 20

## 2020-04-27 MED ORDER — MIDAZOLAM HCL 2 MG/2ML IJ SOLN
INTRAMUSCULAR | Status: AC
  Filled 2020-04-27: qty 2

## 2020-04-27 MED ORDER — LIDOCAINE 2% (20 MG/ML) 5 ML SYRINGE
INTRAMUSCULAR | Status: DC | PRN
  Administered 2020-04-27: 60 mg via INTRAVENOUS

## 2020-04-27 MED ORDER — FENTANYL CITRATE (PF) 100 MCG/2ML IJ SOLN
100.0000 ug | Freq: Once | INTRAMUSCULAR | Status: AC
  Administered 2020-04-27: 100 ug via INTRAVENOUS

## 2020-04-27 MED ORDER — DEXAMETHASONE SODIUM PHOSPHATE 10 MG/ML IJ SOLN
INTRAMUSCULAR | Status: AC
  Filled 2020-04-27: qty 1

## 2020-04-27 MED ORDER — OXYCODONE HCL 5 MG PO TABS: 5.0000 mg | ORAL_TABLET | Freq: Once | ORAL | Status: DC | PRN

## 2020-04-27 MED ORDER — PROPOFOL 10 MG/ML IV BOLUS
INTRAVENOUS | Status: AC
  Filled 2020-04-27: qty 20

## 2020-04-27 MED ORDER — AMISULPRIDE (ANTIEMETIC) 5 MG/2ML IV SOLN: 10.0000 mg | Freq: Once | INTRAVENOUS | Status: DC | PRN

## 2020-04-27 MED ORDER — HYDROMORPHONE HCL 1 MG/ML IJ SOLN: 0.2500 mg | INTRAMUSCULAR | Status: DC | PRN

## 2020-04-27 MED ORDER — KETOROLAC TROMETHAMINE 15 MG/ML IJ SOLN
INTRAMUSCULAR | Status: AC
  Filled 2020-04-27: qty 1

## 2020-04-27 MED ORDER — BUPIVACAINE HCL (PF) 0.25 % IJ SOLN
INTRAMUSCULAR | Status: DC | PRN
  Administered 2020-04-27: 8 mL

## 2020-04-27 MED ORDER — MIDAZOLAM HCL 2 MG/2ML IJ SOLN
2.0000 mg | Freq: Once | INTRAMUSCULAR | Status: AC
  Administered 2020-04-27: 2 mg via INTRAVENOUS

## 2020-04-27 MED ORDER — GABAPENTIN 100 MG PO CAPS
100.0000 mg | ORAL_CAPSULE | ORAL | Status: AC
  Administered 2020-04-27: 100 mg via ORAL

## 2020-04-27 MED ORDER — TECHNETIUM TC 99M SULFUR COLLOID FILTERED
1.0000 | Freq: Once | INTRAVENOUS | Status: AC | PRN
  Administered 2020-04-27: 1 via INTRADERMAL

## 2020-04-27 MED ORDER — KETOROLAC TROMETHAMINE 15 MG/ML IJ SOLN
15.0000 mg | INTRAMUSCULAR | Status: AC
  Administered 2020-04-27: 15 mg via INTRAVENOUS

## 2020-04-27 SURGICAL SUPPLY — 57 items
ADH SKN CLS APL DERMABOND .7 (GAUZE/BANDAGES/DRESSINGS) ×1
APL PRP STRL LF DISP 70% ISPRP (MISCELLANEOUS) ×1
APPLIER CLIP 9.375 MED OPEN (MISCELLANEOUS) ×2
APR CLP MED 9.3 20 MLT OPN (MISCELLANEOUS) ×1
BINDER BREAST XLRG (GAUZE/BANDAGES/DRESSINGS) ×1 IMPLANT
BLADE SURG 15 STRL LF DISP TIS (BLADE) ×1 IMPLANT
BLADE SURG 15 STRL SS (BLADE) ×2
CANISTER SUCT 1200ML W/VALVE (MISCELLANEOUS) ×1 IMPLANT
CHLORAPREP W/TINT 26 (MISCELLANEOUS) ×2 IMPLANT
CLIP APPLIE 9.375 MED OPEN (MISCELLANEOUS) IMPLANT
CLIP VESOCCLUDE SM WIDE 6/CT (CLIP) ×2 IMPLANT
COVER BACK TABLE 60X90IN (DRAPES) ×2 IMPLANT
COVER MAYO STAND STRL (DRAPES) ×2 IMPLANT
COVER PROBE W GEL 5X96 (DRAPES) ×2 IMPLANT
DERMABOND ADVANCED (GAUZE/BANDAGES/DRESSINGS) ×1
DERMABOND ADVANCED .7 DNX12 (GAUZE/BANDAGES/DRESSINGS) ×1 IMPLANT
DRAPE LAPAROSCOPIC ABDOMINAL (DRAPES) ×2 IMPLANT
DRAPE UTILITY XL STRL (DRAPES) ×2 IMPLANT
ELECT COATED BLADE 2.86 ST (ELECTRODE) ×2 IMPLANT
ELECT REM PT RETURN 9FT ADLT (ELECTROSURGICAL) ×2
ELECTRODE REM PT RTRN 9FT ADLT (ELECTROSURGICAL) ×1 IMPLANT
GLOVE BIO SURGEON STRL SZ7 (GLOVE) ×4 IMPLANT
GLOVE BIO SURGEON STRL SZ7.5 (GLOVE) ×1 IMPLANT
GLOVE BIOGEL PI IND STRL 6.5 (GLOVE) IMPLANT
GLOVE BIOGEL PI IND STRL 7.5 (GLOVE) ×1 IMPLANT
GLOVE BIOGEL PI IND STRL 8 (GLOVE) IMPLANT
GLOVE BIOGEL PI INDICATOR 6.5 (GLOVE) ×2
GLOVE BIOGEL PI INDICATOR 7.5 (GLOVE) ×1
GLOVE BIOGEL PI INDICATOR 8 (GLOVE) ×1
GLOVE ECLIPSE 6.5 STRL STRAW (GLOVE) ×1 IMPLANT
GOWN STRL REUS W/ TWL LRG LVL3 (GOWN DISPOSABLE) ×2 IMPLANT
GOWN STRL REUS W/ TWL XL LVL3 (GOWN DISPOSABLE) IMPLANT
GOWN STRL REUS W/TWL LRG LVL3 (GOWN DISPOSABLE) ×4
GOWN STRL REUS W/TWL XL LVL3 (GOWN DISPOSABLE) ×2
HEMOSTAT ARISTA ABSORB 3G PWDR (HEMOSTASIS) ×1 IMPLANT
KIT MARKER MARGIN INK (KITS) ×2 IMPLANT
NDL HYPO 25X1 1.5 SAFETY (NEEDLE) ×1 IMPLANT
NEEDLE HYPO 25X1 1.5 SAFETY (NEEDLE) ×2 IMPLANT
NS IRRIG 1000ML POUR BTL (IV SOLUTION) ×1 IMPLANT
PACK BASIN DAY SURGERY FS (CUSTOM PROCEDURE TRAY) ×2 IMPLANT
PENCIL SMOKE EVACUATOR (MISCELLANEOUS) ×2 IMPLANT
RETRACTOR ONETRAX LX 90X20 (MISCELLANEOUS) ×1 IMPLANT
SLEEVE SCD COMPRESS KNEE MED (MISCELLANEOUS) ×2 IMPLANT
SPONGE LAP 4X18 RFD (DISPOSABLE) ×2 IMPLANT
STRIP CLOSURE SKIN 1/2X4 (GAUZE/BANDAGES/DRESSINGS) ×2 IMPLANT
SUT MNCRL AB 4-0 PS2 18 (SUTURE) ×2 IMPLANT
SUT MON AB 5-0 PS2 18 (SUTURE) IMPLANT
SUT SILK 2 0 SH (SUTURE) ×1 IMPLANT
SUT VIC AB 2-0 SH 27 (SUTURE) ×6
SUT VIC AB 2-0 SH 27XBRD (SUTURE) ×1 IMPLANT
SUT VIC AB 3-0 SH 27 (SUTURE) ×6
SUT VIC AB 3-0 SH 27X BRD (SUTURE) ×1 IMPLANT
SYR CONTROL 10ML LL (SYRINGE) ×2 IMPLANT
TOWEL GREEN STERILE FF (TOWEL DISPOSABLE) ×2 IMPLANT
TRAY FAXITRON CT DISP (TRAY / TRAY PROCEDURE) ×2 IMPLANT
TUBE CONNECTING 20X1/4 (TUBING) ×1 IMPLANT
YANKAUER SUCT BULB TIP NO VENT (SUCTIONS) ×1 IMPLANT

## 2020-04-27 NOTE — Discharge Instructions (Signed)
[facility] Office Phone Number [phone]  BREAST BIOPSY/ PARTIAL MASTECTOMY: POST OP INSTRUCTIONS Take 400 mg of ibuprofen every 8 hours or 650 mg tylenol every 6 hours for next 72 hours then as needed. Use ice several times daily also. Always review your discharge instruction sheet given to you by the facility where your surgery was performed.  IF YOU HAVE DISABILITY OR FAMILY LEAVE FORMS, YOU MUST BRING THEM TO THE OFFICE FOR PROCESSING.  DO NOT GIVE THEM TO YOUR DOCTOR.  1. A prescription for pain medication may be given to you upon discharge.  Take your pain medication as prescribed, if needed.  If narcotic pain medicine is not needed, then you may take acetaminophen (Tylenol), naprosyn (Alleve) or ibuprofen (Advil) as needed. 2. Take your usually prescribed medications unless otherwise directed 3. If you need a refill on your pain medication, please contact your pharmacy.  They will contact our office to request authorization.  Prescriptions will not be filled after 5pm or on week-ends. 4. You should eat very light the first 24 hours after surgery, such as soup, crackers, pudding, etc.  Resume your normal diet the day after surgery. 5. Most patients will experience some swelling and bruising in the breast.  Ice packs and a good support bra will help.  Wear the breast binder provided or a sports bra for 72 hours day and night.  After that wear a sports bra during the day until you return to the office. Swelling and bruising can take several days to resolve.  6. It is common to experience some constipation if taking pain medication after surgery.  Increasing fluid intake and taking a stool softener will usually help or prevent this problem from occurring.  A mild laxative (Milk of Magnesia or Miralax) should be taken according to package directions if there are no bowel movements after 48 hours. 7. Unless discharge instructions indicate otherwise, you may remove your bandages 48  hours after surgery and you may shower at that time.  You may have steri-strips (small skin tapes) in place directly over the incision.  These strips should be left on the skin for 7-10 days and will come off on their own.  If your surgeon used skin glue on the incision, you may shower in 24 hours.  The glue will flake off over the next 2-3 weeks.  Any sutures or staples will be removed at the office during your follow-up visit. 8. ACTIVITIES:  You may resume regular daily activities (gradually increasing) beginning the next day.  Wearing a good support bra or sports bra minimizes pain and swelling.  You may have sexual intercourse when it is comfortable. a. You may drive when you no longer are taking prescription pain medication, you can comfortably wear a seatbelt, and you can safely maneuver your car and apply brakes. b. RETURN TO WORK:  ______________________________________________________________________________________ 9. You should see your doctor in the office for a follow-up appointment approximately two weeks after your surgery.  Your doctor's nurse will typically make your follow-up appointment when she calls you with your pathology report.  Expect your pathology report 3-4 business days after your surgery.  You may call to check if you do not hear from us after three days. 10. OTHER INSTRUCTIONS: _______________________________________________________________________________________________ _____________________________________________________________________________________________________________________________________ _____________________________________________________________________________________________________________________________________ _____________________________________________________________________________________________________________________________________  WHEN TO CALL DR WAKEFIELD: 1. Fever over 101.0 2. Nausea and/or vomiting. 3. Extreme swelling or  bruising. 4. Continued bleeding from incision. 5. Increased pain, redness, or drainage from the incision.  The clinic   staff is available to answer your questions during regular business hours.  Please don't hesitate to call and ask to speak to one of the nurses for clinical concerns.  If you have a medical emergency, go to the nearest emergency room or call 911.  A surgeon from [facility] is always on call at the hospital.  For further questions, please visit [email or web address]  No Tylenol or ibuprofen until after 6pm today if needed Post Anesthesia Home Care Instructions  Activity: Get plenty of rest for the remainder of the day. A responsible individual must stay with you for 24 hours following the procedure.  For the next 24 hours, DO NOT: -Drive a car -Paediatric nurse -Drink alcoholic beverages -Take any medication unless instructed by your physician -Make any legal decisions or sign important papers.  Meals: Start with liquid foods such as gelatin or soup. Progress to regular foods as tolerated. Avoid greasy, spicy, heavy foods. If nausea and/or vomiting occur, drink only clear liquids until the nausea and/or vomiting subsides. Call your physician if vomiting continues.  Special Instructions/Symptoms: Your throat may feel dry or sore from the anesthesia or the breathing tube placed in your throat during surgery. If this causes discomfort, gargle with warm salt water. The discomfort should disappear within 24 hours.  If you had a scopolamine patch placed behind your ear for the management of post- operative nausea and/or vomiting:  1. The medication in the patch is effective for 72 hours, after which it should be removed.  Wrap patch in a tissue and discard in the trash. Wash hands thoroughly with soap and water. 2. You may remove the patch earlier than 72 hours if you experience unpleasant side effects which may include dry mouth, dizziness or visual  disturbances. 3. Avoid touching the patch. Wash your hands with soap and water after contact with the patch.     ]

## 2020-04-27 NOTE — H&P (Signed)
  24 yof with prior history 2005 of right breast cancer treated with chemo/rads/lump/sn and I think antiestrogen from which she has done well. she has no mass or dc. she has screening detected left breast mass. in upper central left breast there is a 7x6x6 mm breast mass. axilla negative by [place]. 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 she lives in [place], is retired. history of recent left shoulder surgery. no issues with rue.  Past Surgical History Rolm Bookbinder, MD; 04/16/2020 8:32 AM) Shoulder Surgery - Left  Lumpectomy  Hysterectomy   Medication History Rolm Bookbinder, MD; 04/16/2020 8:30 AM) Medications Reconciled Gelnique (10% Gel, Transdermal) Active. VESIcare (10MG  Tablet, Oral) Active. Tamoxifen Citrate (10MG  Tablet, Oral) Active. Tylenol (500MG  Capsule, Oral) Active.  Social History Rolm Bookbinder, MD; 04/16/2020 8:31 AM) Current tobacco use  Never smoker. No alcohol use   Family History Rolm Bookbinder, MD; 04/16/2020 8:31 AM) Prostate Cancer  Father. Heart Disease  Mother.   Physical Exam Rolm Bookbinder MD; 04/16/2020 8:28 AM) General Mental Status-Alert. Orientation-Oriented X3. Breast Nipples-No Discharge. Breast Lump-No Palpable Breast Mass. Lymphatic Head & Neck General Head & Neck Lymphatics: Bilateral - Description - Normal. Axillary General Axillary Region: Bilateral - Description - Normal. Note: no [place] adenopathy   Assessment & Plan Rolm Bookbinder MD; 04/16/2020 8:30 AM) BREAST CANCER OF UPPER-OUTER QUADRANT OF LEFT FEMALE BREAST (C50.412) Story: Left breast seed guided lumpectomy. left ax sn biopsy We discussed the staging and pathophysiology of breast cancer. We discussed all of the different options for treatment for breast cancer including surgery, chemotherapy, radiation therapy, Herceptin, and antiestrogen therapy. We discussed a sentinel lymph node biopsy as  she does not appear to having lymph node involvement right now. We discussed the performance of that with injection of radioactive tracer. We discussed that there is a chance of having a positive node with a sentinel lymph node biopsy and we will await the permanent pathology to make any other first further decisions in terms of her treatment. We discussed up to a 5% risk lifetime of chronic shoulder pain as well as lymphedema associated with a sentinel lymph node biopsy. We discussed the options for treatment of the breast cancer which included lumpectomy versus a mastectomy. We discussed the performance of the lumpectomy with radioactive seed placement. We discussed a 5-10% chance of a positive margin requiring reexcision in the operating room. We also discussed that she will likely need radiation therapy if she undergoes lumpectomy. The breast cannot undergo more radiation therapy in the same breast after lumpectomy in the future. We discussed mastectomy and the postoperative care for that as well. Mastectomy can be followed by reconstruction. The decision for lumpectomy vs mastectomy has no impact on decision for chemotherapy. Most mastectomy patients will not need radiation therapy. We discussed that there is no difference in her survival whether she undergoes lumpectomy with radiation therapy or antiestrogen therapy versus a mastectomy. There is also no real difference between her recurrence in the breast. We discussed the risks of operation including bleeding, infection, possible reoperation. She understands her further therapy will be based on what her stages at the time of her operation.

## 2020-04-27 NOTE — Anesthesia Preprocedure Evaluation (Signed)
Anesthesia Evaluation  Patient identified by MRN, date of birth, ID band Patient awake    Reviewed: Allergy & Precautions, NPO status , Patient's Chart, lab work & pertinent test results  Airway Mallampati: II  TM Distance: >3 FB Neck ROM: Full    Dental no notable dental hx.    Pulmonary neg pulmonary ROS,    Pulmonary exam normal breath sounds clear to auscultation       Cardiovascular negative cardio ROS Normal cardiovascular exam Rhythm:Regular Rate:Normal     Neuro/Psych negative neurological ROS  negative psych ROS   GI/Hepatic negative GI ROS, Neg liver ROS,   Endo/Other  negative endocrine ROS  Renal/GU negative Renal ROS  negative genitourinary   Musculoskeletal negative musculoskeletal ROS (+)   Abdominal (+) + obese,   Peds negative pediatric ROS (+)  Hematology negative hematology ROS (+)   Anesthesia Other Findings Breast Cancer  Reproductive/Obstetrics negative OB ROS                             Anesthesia Physical Anesthesia Plan  ASA: III  Anesthesia Plan: General   Post-op Pain Management:  Regional for Post-op pain   Induction: Intravenous  PONV Risk Score and Plan: 3 and Ondansetron, Dexamethasone, Midazolam and Treatment may vary due to age or medical condition  Airway Management Planned: LMA  Additional Equipment:   Intra-op Plan:   Post-operative Plan: Extubation in OR  Informed Consent: I have reviewed the patients History and Physical, chart, labs and discussed the procedure including the risks, benefits and alternatives for the proposed anesthesia with the patient or authorized representative who has indicated his/her understanding and acceptance.     Dental advisory given  Plan Discussed with: CRNA  Anesthesia Plan Comments:         Anesthesia Quick Evaluation

## 2020-04-27 NOTE — Progress Notes (Signed)
Nuclear med at bedside.  Administered injections.

## 2020-04-27 NOTE — Anesthesia Procedure Notes (Signed)
Procedure Name: LMA Insertion Date/Time: 04/27/2020 12:30 PM Performed by: Myna Bright, CRNA Pre-anesthesia Checklist: Patient identified, Emergency Drugs available, Suction available and Patient being monitored Patient Re-evaluated:Patient Re-evaluated prior to induction Oxygen Delivery Method: Circle system utilized Preoxygenation: Pre-oxygenation with 100% oxygen Induction Type: IV induction Ventilation: Mask ventilation without difficulty LMA: LMA inserted LMA Size: 4.0 Number of attempts: 1 Placement Confirmation: positive ETCO2 and breath sounds checked- equal and bilateral Tube secured with: Tape Dental Injury: Teeth and Oropharynx as per pre-operative assessment

## 2020-04-27 NOTE — Anesthesia Postprocedure Evaluation (Signed)
Anesthesia Post Note  Patient: Christine Mosley  Procedure(s) Performed: LEFT BREAST LUMPECTOMY WITH RADIOACTIVE SEED AND LEFT AXILLARY SENTINEL LYMPH NODE BIOPSY (Left Breast)     Patient location during evaluation: PACU Anesthesia Type: General Level of consciousness: awake and alert Pain management: pain level controlled Vital Signs Assessment: post-procedure vital signs reviewed and stable Respiratory status: spontaneous breathing, nonlabored ventilation and respiratory function stable Cardiovascular status: blood pressure returned to baseline and stable Postop Assessment: no apparent nausea or vomiting Anesthetic complications: no   No complications documented.  Last Vitals:  Vitals:   04/27/20 1500 04/27/20 1517  BP: 106/60 128/75  Pulse: 73 77  Resp: 18 20  Temp:  (!) 36.4 C  SpO2: 95% 98%    Last Pain:  Vitals:   04/27/20 1517  TempSrc: Oral  PainSc: 0-No pain                 Lynda Rainwater

## 2020-04-27 NOTE — Transfer of Care (Signed)
Immediate Anesthesia Transfer of Care Note  Patient: Christine Mosley  Procedure(s) Performed: LEFT BREAST LUMPECTOMY WITH RADIOACTIVE SEED AND LEFT AXILLARY SENTINEL LYMPH NODE BIOPSY (Left Breast)  Patient Location: PACU  Anesthesia Type:GA combined with regional for post-op pain  Level of Consciousness: awake, alert , oriented and patient cooperative  Airway & Oxygen Therapy: Patient Spontanous Breathing and Patient connected to face mask oxygen  Post-op Assessment: Report given to RN, Post -op Vital signs reviewed and stable and Patient moving all extremities  Post vital signs: Reviewed and stable  Last Vitals:  Vitals Value Taken Time  BP 99/57 04/27/20 1357  Temp    Pulse 75 04/27/20 1358  Resp 14 04/27/20 1358  SpO2 100 % 04/27/20 1358  Vitals shown include unvalidated device data.  Last Pain:  Vitals:   04/27/20 1132  TempSrc: Oral  PainSc: 0-No pain         Complications: No complications documented.

## 2020-04-27 NOTE — Op Note (Signed)
Preoperative diagnosis: clinical stage I left breast cancer Postoperative diagnosis: saa Procedure: 1.  Left breast radioactive seed guided lumpectomy 2.  Left deep axillary sentinel lymph node biopsy Surgeon: Dr. Serita Grammes Anesthesia: General with a pectoral block Estimated blood loss: 0 cc Drains: None Complications: None Specimens: 1.  Left breast lumpectomy marked with paint 2.  Additional superior, anterior medial, lateral margins of lumpectomy marked short superior, long lateral, double deep 3.  Left deep axillary sentinel lymph nodes with highest count of 979 Specimen count was correct completion Disposition recovery stable condition  Indications: 71 yof with prior history 2005 of right breast cancer treated with chemo/rads/lump/sn and I think antiestrogen from which she has done well. she has no mass or dc. she has screening detected left breast mass. in upper central left breast there is a 7x6x6 mm breast mass. axilla negative by [place]. 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   Procedure: After informed consent was obtained the patient first underwent a pectoral block.  She was also injected with technetium in the standard periareolar fashion.  She was given antibiotics.  SCDs were in place.  She was then placed under general anesthesia without complication.  Her left breast was prepped and draped in a standard sterile surgical fashion.  Surgical timeout was then performed.  I made a periareolar incision to hide the scar later.  I then used the lighted retractor to tunnel to the mass and seed. There was a mass anterior that was palpable and I removed that as well as the tissue down to the seed.   Mammography confirmed removal of the clip and the radioactive seed.  I did remove some additional margins as I thought they might be close.  I then obtained hemostasis.  I placed clips in the cavity..I mobilized the breast tissue and closed  this all with 2-0 Vicryl in several layers.  I then closed the dermis with 3-0 Vicryl and the skin with 4-0 Monocryl.  Glue and Steri-Strips were later applied.  I then identified the sentinel node in the low axilla.  I made an incision and carried this to the axillary fascia.  Using neoprobe guidance I removed several small normal-appearing sentinel nodes.  There was no background radioactivity.  I passed these off the table.  I then closed this with 2-0 Vicryl.  Skin was closed with 3-0 Vicryl for Monocryl.  Glue and Steri-Strips were applied.  She had a binder placed.  She was extubated in the operating and transferred to recovery stable.

## 2020-04-27 NOTE — Anesthesia Procedure Notes (Signed)
Anesthesia Regional Block: Pectoralis block   Pre-Anesthetic Checklist: ,, timeout performed, Correct Patient, Correct Site, Correct Laterality, Correct Procedure, Correct Position, site marked, Risks and benefits discussed,  Surgical consent,  Pre-op evaluation,  At surgeon's request and post-op pain management  Laterality: Left  Prep: chloraprep       Needles:  Injection technique: Single-shot  Needle Type: Stimiplex     Needle Length: 9cm  Needle Gauge: 21     Additional Needles:   Procedures:,,,, ultrasound used (permanent image in chart),,,,  Narrative:  Start time: 04/27/2020 11:55 AM End time: 04/27/2020 12:00 PM Injection made incrementally with aspirations every 5 mL.  Performed by: Personally  Anesthesiologist: Lynda Rainwater, MD

## 2020-04-27 NOTE — Progress Notes (Signed)
Assisted Dr. Miller with left, ultrasound guided, pectoralis block. Side rails up, monitors on throughout procedure. See vital signs in flow sheet. Tolerated Procedure well. 

## 2020-04-28 ENCOUNTER — Telehealth: Payer: Self-pay | Admitting: *Deleted

## 2020-04-28 ENCOUNTER — Encounter (HOSPITAL_BASED_OUTPATIENT_CLINIC_OR_DEPARTMENT_OTHER): Payer: Self-pay | Admitting: General Surgery

## 2020-04-28 NOTE — Telephone Encounter (Signed)
Message left by the patient stating " Dr Jana Hakim is not covered under my insurance policy- I will need to se another MD "  Return call number given as [phone].  This message will be forwarded to [facility] navigators for follow up.

## 2020-04-29 ENCOUNTER — Ambulatory Visit: Payer: Self-pay | Admitting: Genetic Counselor

## 2020-04-29 ENCOUNTER — Encounter: Payer: Self-pay | Admitting: Genetic Counselor

## 2020-04-29 ENCOUNTER — Telehealth: Payer: Self-pay | Admitting: *Deleted

## 2020-04-29 ENCOUNTER — Telehealth: Payer: Self-pay | Admitting: Genetic Counselor

## 2020-04-29 DIAGNOSIS — Z1379 Encounter for other screening for genetic and chromosomal anomalies: Secondary | ICD-10-CM

## 2020-04-29 LAB — SURGICAL PATHOLOGY

## 2020-04-29 NOTE — Progress Notes (Signed)
HPI:  Christine Mosley was previously seen in the [place] clinic due to a personal and family history of cancer and concerns regarding a hereditary predisposition to cancer. Please refer to our prior cancer genetics clinic note for more information regarding our discussion, assessment and recommendations, at the time. Christine Mosley recent genetic test results were disclosed to her, as were recommendations warranted by these results. These results and recommendations are discussed in more detail below.  CANCER HISTORY:  Oncology History  Malignant neoplasm of upper-inner quadrant of left breast in female, estrogen receptor positive ([place])  04/12/2020 Initial Diagnosis   Malignant neoplasm of upper-inner quadrant of left breast in female, estrogen receptor positive ([place])   04/23/2020 Genetic Testing   Negative genetic testing:  No pathogenic variants detected on the [facility] Common Hereditary Cancers Panel. A variant of uncertain significance (VUS) was detected in the POLE gene called c.6322G>T. The report date is 04/23/2020.  The Common Hereditary Cancers Panel offered by [facility] includes sequencing and/or deletion duplication testing of the following 48 genes: APC, ATM, AXIN2, BARD1, BMPR1A, BRCA1, BRCA2, BRIP1, CDH1, CDK4, CDKN2A (p14ARF), CDKN2A (p16INK4a), CHEK2, CTNNA1, DICER1, EPCAM (Deletion/duplication testing only), GREM1 (promoter region deletion/duplication testing only), KIT, MEN1, MLH1, MSH2, MSH3, MSH6, MUTYH, NBN, NF1, NTHL1, PALB2, PDGFRA, PMS2, POLD1, POLE, PTEN, RAD50, RAD51C, RAD51D, RNF43, SDHB, SDHC, SDHD, SMAD4, SMARCA4. STK11, TP53, TSC1, TSC2, and VHL.  The following genes were evaluated for sequence changes only: SDHA and HOXB13 c.251G>A variant only.       FAMILY HISTORY:  We obtained a detailed, 4-generation family history.  Significant diagnoses are listed below: Family History  Problem Relation Age of Onset  . Congestive Heart Failure Mother   . Prostate cancer  Father 98       metastatic  . Lung cancer Sister 60  . Liver cancer Maternal Aunt 52  . Skin cancer Sister        multiple  . Cancer Maternal Aunt        unknown type, dx. in her 74s  . Lung cancer Cousin        paternal first cousin  . Cancer Cousin        mouth cancer; paternal first cousin  . Cancer Cousin        nose cancer; paternal first cousin     Christine Mosley has two sons (ages 22 and 61). She had three sisters. One sister died from lung cancer at the age of 26. Another sister has a history of multiple skin cancers, although Christine Mosley is not sure whether they were melanoma or a nonmelanoma skin cancer.  Christine Mosley mother died at age 16 and did not have cancer. Her mother had 10 paternal half-siblings and five maternal half-siblings. One of her mother's paternal half-sisters died from cancer in her late 6s (unknown type). One of her mother's maternal half-sisters died from liver cancer at the age of 81. Christine Mosley maternal grandmother died at age 39, and her maternal grandfather died at age 29. There are no other known diagnoses of cancer on the maternal side of the family.  Christine Mosley father died age 20 from metastatic prostate cancer (first diagnosed age 70). She had two paternal aunts and three paternal uncles. She had three female cousins with cancer - one had lung cancer, one had mouth cancer, and one had nose cancer. Her paternal grandmother died at age 37 and her paternal grandfather died at age 42. There are no other known diagnoses of  cancer on the paternal side of the family.  Christine Mosley is unaware of previous family history of genetic testing for hereditary cancer risks. Patient's ancestors are of unknown descent. There is no reported Ashkenazi Jewish ancestry. There is no known consanguinity.  GENETIC TEST RESULTS: Genetic testing reported out on 04/23/2020 through the [facility] Common Hereditary Cancers panel. No pathogenic variants were detected.   The Common Hereditary  Cancers Panel offered by [facility] includes sequencing and/or deletion duplication testing of the following 48 genes: APC, ATM, AXIN2, BARD1, BMPR1A, BRCA1, BRCA2, BRIP1, CDH1, CDK4, CDKN2A (p14ARF), CDKN2A (p16INK4a), CHEK2, CTNNA1, DICER1, EPCAM (Deletion/duplication testing only), GREM1 (promoter region deletion/duplication testing only), KIT, MEN1, MLH1, MSH2, MSH3, MSH6, MUTYH, NBN, NF1, NTHL1, PALB2, PDGFRA, PMS2, POLD1, POLE, PTEN, RAD50, RAD51C, RAD51D, RNF43, SDHB, SDHC, SDHD, SMAD4, SMARCA4. STK11, TP53, TSC1, TSC2, and VHL.  The following genes were evaluated for sequence changes only: SDHA and HOXB13 c.251G>A variant only. The test report will be scanned into EPIC and located under the Molecular Pathology section of the Results Review tab.  A portion of the result report is included below for reference.     We discussed with Christine Mosley that because current genetic testing is not perfect, it is possible there may be a gene mutation in one of these genes that current testing cannot detect, but that chance is small.  We also discussed that there could be another gene that has not yet been discovered, or that we have not yet tested, that is responsible for the cancer diagnoses in the family. It is also possible there is a hereditary cause for the cancer in the family that Christine Mosley did not inherit and therefore was not identified in her testing.  Therefore, it is important to remain in touch with cancer genetics in the future so that we can continue to offer Christine Mosley the most up to date genetic testing.   Genetic testing did identify a variant of uncertain significance (VUS) in the POLE gene called c.6322G>T.  At this time, it is unknown if this variant is associated with increased cancer risk or if this is a normal finding, but most variants such as this get reclassified to being inconsequential. It should not be used to make medical management decisions. With time, we suspect the lab will determine the  significance of this variant, if any. If we do learn more about it, we will try to contact Christine Mosley to discuss it further. However, it is important to stay in touch with [place] periodically and keep the address and phone number up to date.  CANCER SCREENING RECOMMENDATIONS: Christine Mosley test result is considered negative (normal).  This means that we have not identified a hereditary cause for her personal and family history of cancer at this time. While reassuring, this does not definitively rule out a hereditary predisposition to cancer. It is still possible that there could be genetic mutations that are undetectable by current technology. There could be genetic mutations in genes that have not been tested or identified to increase cancer risk.  Therefore, it is recommended she continue to follow the cancer management and screening guidelines provided by her oncology and primary healthcare provider.   An individual's cancer risk and medical management are not determined by genetic test results alone. Overall cancer risk assessment incorporates additional factors, including personal medical history, family history, and any available genetic information that may result in a personalized plan for cancer prevention and surveillance.  RECOMMENDATIONS FOR FAMILY MEMBERS:  Individuals in this family might be at some increased risk of developing cancer, over the general population risk, simply due to the family history of cancer.  We recommended women in this family have a yearly mammogram beginning at age 2, or 29 years younger than the earliest onset of cancer, an annual clinical breast exam, and perform monthly breast self-exams. Women in this family should also have a gynecological exam as recommended by their primary provider. All family members should be referred for colonoscopy starting at age 60.  It is also possible there is a hereditary cause for the cancer in Christine Mosley family that she did not inherit and  therefore was not identified in her.  Based on Christine Mosley family history, we recommended her sisters have genetic counseling and testing if they are interested. Christine Mosley will let us know if we can be of any assistance in coordinating genetic counseling and/or testing for this family member.   FOLLOW-UP: Lastly, we discussed with Christine Mosley that cancer genetics is a rapidly advancing field and it is possible that new genetic tests will be appropriate for her and/or her family members in the future. We encouraged her to remain in contact with cancer genetics on an annual basis so we can update her personal and family histories and let her know of advances in cancer genetics that may benefit this family.   Our contact number was provided. Christine Mosley questions were answered to her satisfaction, and she knows she is welcome to call us at anytime with additional questions or concerns.   Clint Guy, MS, [facility] Genetic Counselor [email or web address] Phone: [phone]

## 2020-04-29 NOTE — Telephone Encounter (Signed)
Revealed negative genetic testing. Discussed that we do not know why she has breast cancer or why there is cancer in the family. There could be a genetic mutation in the family that Christine Mosley did not inherit. There could also be a mutation in a different gene that we are not testing, or our current technology may not be able detect certain mutations. It will therefore be important for her to stay in contact with genetics to keep up with whether additional testing may be appropriate in the future.   A variant of uncertain significance was detected in the POLE gene called c.6322G>T. Her result is still considered normal at this time and should not impact her medical management.  Of note, Christine Mosley mentioned that she had requested for this test to be cancelled when they called her with an estimated out of pocket cost of $100. The test was not cancelled and Christine Mosley is unwilling to pay for the test since she requested that the laboratory cancel it. We will reach out to [facility] regarding this concern and reach out to Christine Mosley with any updates.

## 2020-04-29 NOTE — Telephone Encounter (Signed)
Pt called to inform she received letter stating Dr. Jana Hakim was not covered in her insurance plan. Pt has [employer]. Msg sent to [facility] for verification. Informed pt would return call regarding information received upon review of coverage.

## 2020-04-30 ENCOUNTER — Encounter: Payer: Self-pay | Admitting: *Deleted

## 2020-04-30 ENCOUNTER — Telehealth: Payer: Self-pay | Admitting: *Deleted

## 2020-04-30 NOTE — Telephone Encounter (Signed)
Ordered mammaprint per Dr. Jana Hakim. Requisition faxed to pathology and [facility]

## 2020-05-03 ENCOUNTER — Telehealth: Payer: Self-pay | Admitting: Genetic Counselor

## 2020-05-03 ENCOUNTER — Other Ambulatory Visit: Payer: Self-pay | Admitting: Oncology

## 2020-05-03 NOTE — Telephone Encounter (Signed)
Discussed with Christine Mosley that the genetic testing laboratory, [facility], has agreed to waive the $100 cost for genetic testing that they had previously discussed with her. She should not receive a bill for this test.

## 2020-05-04 ENCOUNTER — Encounter: Payer: Self-pay | Admitting: Genetic Counselor

## 2020-05-05 ENCOUNTER — Other Ambulatory Visit: Payer: Self-pay | Admitting: Oncology

## 2020-05-05 ENCOUNTER — Inpatient Hospital Stay: Payer: BC Managed Care – PPO | Attending: Oncology

## 2020-05-05 ENCOUNTER — Other Ambulatory Visit: Payer: Self-pay

## 2020-05-05 DIAGNOSIS — Z23 Encounter for immunization: Secondary | ICD-10-CM

## 2020-05-05 NOTE — Progress Notes (Signed)
   Covid-19 Vaccination Clinic  Name:  Christine Mosley    MRN: [id number] DOB: 1956/03/30  05/05/2020  Christine Mosley was observed post Covid-19 immunization for 15 minutes without incident. She was provided with Vaccine Information Sheet and instruction to access the V-Safe system.   Christine Mosley was instructed to call 911 with any severe reactions post vaccine: Marland Kitchen Difficulty breathing  . Swelling of face and throat  . A fast heartbeat  . A bad rash all over body  . Dizziness and weakness   Immunizations Administered    Name Date Dose VIS Date Route   Pfizer COVID-19 Vaccine 05/05/2020 11:13 AM 0.3 mL 10/15/2018 Intramuscular   Manufacturer: [place]   Lot: [id number]   [place]: [id number]

## 2020-05-05 NOTE — Progress Notes (Signed)
   Covid-19 Vaccination Clinic  Name:  SEIDY LABRECK    MRN: [id number] DOB: 07-25-1956  05/05/2020  Ms. Eugene was observed post Covid-19 immunization for 15 minutes without incident. She was provided with Vaccine Information Sheet and instruction to access the V-Safe system.   Ms. Laster was instructed to call 911 with any severe reactions post vaccine: Marland Kitchen Difficulty breathing  . Swelling of face and throat  . A fast heartbeat  . A bad rash all over body  . Dizziness and weakness   Immunizations Administered    Name Date Dose VIS Date Route   Pfizer COVID-19 Vaccine 05/05/2020 11:13 AM 0.3 mL 10/15/2018 Intramuscular   Manufacturer: [place]   Lot: [id number]   [place]: [id number]

## 2020-05-11 ENCOUNTER — Telehealth: Payer: Self-pay | Admitting: *Deleted

## 2020-05-11 ENCOUNTER — Encounter: Payer: Self-pay | Admitting: *Deleted

## 2020-05-11 DIAGNOSIS — C50212 Malignant neoplasm of upper-inner quadrant of left female breast: Secondary | ICD-10-CM

## 2020-05-11 DIAGNOSIS — Z170 Estrogen receptor positive status [ER+]: Secondary | ICD-10-CM

## 2020-05-11 NOTE — Telephone Encounter (Signed)
Received mammaprint results of low risk. She is aware. Referral placed for Dr. Isidore Moos.

## 2020-05-13 ENCOUNTER — Encounter (HOSPITAL_COMMUNITY): Payer: Self-pay | Admitting: Oncology

## 2020-05-18 ENCOUNTER — Encounter: Payer: Self-pay | Admitting: *Deleted

## 2020-05-18 ENCOUNTER — Encounter: Payer: Self-pay | Admitting: Oncology

## 2020-05-20 ENCOUNTER — Encounter: Payer: Self-pay | Admitting: Physical Therapy

## 2020-05-20 ENCOUNTER — Ambulatory Visit: Payer: BC Managed Care – PPO | Attending: General Surgery | Admitting: Physical Therapy

## 2020-05-20 ENCOUNTER — Other Ambulatory Visit: Payer: Self-pay

## 2020-05-20 DIAGNOSIS — Z483 Aftercare following surgery for neoplasm: Secondary | ICD-10-CM | POA: Diagnosis present

## 2020-05-20 DIAGNOSIS — Z170 Estrogen receptor positive status [ER+]: Secondary | ICD-10-CM | POA: Insufficient documentation

## 2020-05-20 DIAGNOSIS — R293 Abnormal posture: Secondary | ICD-10-CM

## 2020-05-20 DIAGNOSIS — C50212 Malignant neoplasm of upper-inner quadrant of left female breast: Secondary | ICD-10-CM | POA: Diagnosis not present

## 2020-05-20 DIAGNOSIS — M79622 Pain in left upper arm: Secondary | ICD-10-CM

## 2020-05-20 NOTE — Therapy (Signed)
[place], [place], [address] Phone: [phone]   Fax:  [phone]  Physical Therapy Treatment  Patient Details  Name: Christine Mosley MRN: [id number] Date of Birth: 05-Nov-1955 Referring Provider (PT): Dr. Rolm Bookbinder   Encounter Date: 05/20/2020   PT End of Session - 05/20/20 0940    Visit Number 2    Number of Visits 2    PT Start Time 0858    PT Stop Time 0945    PT Time Calculation (min) 47 min    Activity Tolerance Patient tolerated treatment well    Behavior During Therapy [facility] for tasks assessed/performed           Past Medical History:  Diagnosis Date  . Breast cancer ([place]) 2005   right breast-lumpectomy, chemo. radiation  . Breast cancer ([place]) 2021   left breast IDC with DCIS  . Family history of lung cancer   . Family history of prostate cancer   . Family history of skin cancer   . Personal history of chemotherapy   . Personal history of radiation therapy     Past Surgical History:  Procedure Laterality Date  . ABDOMINAL HYSTERECTOMY    . BREAST LUMPECTOMY Right 2005  . BREAST LUMPECTOMY WITH RADIOACTIVE SEED AND SENTINEL LYMPH NODE BIOPSY Left 04/27/2020   Procedure: LEFT BREAST LUMPECTOMY WITH RADIOACTIVE SEED AND LEFT AXILLARY SENTINEL LYMPH NODE BIOPSY;  Surgeon: Rolm Bookbinder, MD;  Location: [place];  Service: General;  Laterality: Left;  . SHOULDER SURGERY      There were no vitals filed for this visit.   Subjective Assessment - 05/20/20 0859    Subjective Patient reports she underwent a left lumpectomy and sentinel node biopsy (1/1 positive node) on 04/27/2020. She had a Mammaprint test to determine the need for chemotherapy and is came back low risk. She will undergo radiation beginning 05/25/2020. She reports severe pain in her left axilla since surgery.    Pertinent History Patient was diagnosed on 03/22/2020 with left grade II invasive ductal  carcinoma breast cancer. She underwent a left lumpectomy and sentinel node biopsy (1/1 positive node) on 04/27/2020. It is ER/PR positive and HER2 negative with a Ki67 of 5%. She had a left shoulder scope 10/30/2019. She had previous right breast cancer in 2005 and was treated with a right lumpectomy, sentinel node biopsy, chemotherapy and radiation.    Patient Stated Goals Reduce pain and be able to use arm normally    Currently in Pain? Yes    Pain Score 4    Gets up to 8-9/10   Pain Location Axilla    Pain Orientation Left    Pain Descriptors / Indicators Sharp    Pain Type Neuropathic pain;Surgical pain    Pain Onset 1 to 4 weeks ago    Pain Frequency Intermittent    Aggravating Factors  Happens randomly    Pain Relieving Factors Nothing helps except sometimes Tylenol              [facility] PT Assessment - 05/20/20 0001      Assessment   Medical Diagnosis s/p left lumpectomy and SLNB    Referring Provider (PT) Dr. Rolm Bookbinder    Onset Date/Surgical Date 04/27/20    Hand Dominance Right    Prior Therapy Baselines      Precautions   Precautions Other (comment)    Precaution Comments recent surgery      Restrictions   Weight Bearing  Restrictions No      Home Social worker Private residence    Living Arrangements Spouse/significant other;Children   Husband and 63 y.o. son   Available Help at Discharge Family      Prior Function   Level of [place] Unemployed    Leisure Walking 30 minutes twice a day      Cognition   Overall Cognitive Status Within Functional Limits for tasks assessed      Observation/Other Assessments   Observations Left breast and axillary incisions appear to healing well. There is some bumpy hard scar tissue present and lateral breast/proximal trunk edema which may be contributing to her pain. Issued compression foam to place in lateral bra.      Posture/Postural Control   Posture/Postural Control  Postural limitations    Postural Limitations Rounded Shoulders;Forward head      ROM / Strength   AROM / PROM / Strength AROM      AROM   AROM Assessment Site Shoulder    Right/Left Shoulder Left    Left Shoulder Extension 63 Degrees    Left Shoulder Flexion 137 Degrees    Left Shoulder ABduction 146 Degrees    Left Shoulder Internal Rotation 75 Degrees    Left Shoulder External Rotation 73 Degrees             LYMPHEDEMA/ONCOLOGY QUESTIONNAIRE - 05/20/20 0001      Type   Cancer Type Left breast cancer      Surgeries   Lumpectomy Date 04/27/20    Sentinel Lymph Node Biopsy Date 04/27/20    Number Lymph Nodes Removed 1      Treatment   Active Chemotherapy Treatment No    Past Chemotherapy Treatment No    Active Radiation Treatment No    Past Radiation Treatment No    Current Hormone Treatment No    Past Hormone Therapy No      What other symptoms do you have   Are you Having Heaviness or Tightness Yes    Are you having Pain Yes    Are you having pitting edema No    Is it Hard or Difficult finding clothes that fit No    Do you have infections No    Is there Decreased scar mobility Yes    Stemmer Sign No      Lymphedema Assessments   Lymphedema Assessments Upper extremities      Right Upper Extremity Lymphedema   10 cm Proximal to Olecranon Process 29.5 cm    Olecranon Process 25.9 cm    10 cm Proximal to Ulnar Styloid Process 23.3 cm    Just Proximal to Ulnar Styloid Process 16.5 cm    Across Hand at [employer] 19.6 cm    At [place] of 2nd Digit 6.6 cm      Left Upper Extremity Lymphedema   10 cm Proximal to Olecranon Process 29.3 cm    Olecranon Process 25.7 cm    10 cm Proximal to Ulnar Styloid Process 22.7 cm    Just Proximal to Ulnar Styloid Process 16.7 cm    Across Hand at [employer] 19.8 cm    At [place] of 2nd Digit 6.6 cm              Quick Dash - 05/20/20 0001    Open a tight or new jar Moderate difficulty    Do heavy household  chores (wash walls, wash floors) Moderate difficulty  Carry a shopping bag or briefcase Moderate difficulty    Wash your back Mild difficulty    Use a knife to cut food Mild difficulty    Recreational activities in which you take some force or impact through your arm, shoulder, or hand (golf, hammering, tennis) Mild difficulty    During the past week, to what extent has your arm, shoulder or hand problem interfered with your normal social activities with family, friends, neighbors, or groups? Quite a bit    During the past week, to what extent has your arm, shoulder or hand problem limited your work or other regular daily activities Modererately    Arm, shoulder, or hand pain. Severe    Tingling (pins and needles) in your arm, shoulder, or hand Severe    Difficulty Sleeping So much difficuSo much difficulty, I can't sleep    DASH Score 54.55 %                          PT Education - 05/20/20 0939    Education Details ABC class information; scar massage; after care; neural stretching    Person(s) Educated Patient    Methods Explanation;Demonstration;Handout    Comprehension Returned demonstration;Verbalized understanding               PT Long Term Goals - 05/20/20 0944      PT LONG TERM GOAL #1   Title Patient will demonstrate she has regained full shoulder ROM and function post operatively compared to baselines.    Time 8    Period Weeks    Status Partially Met                 Plan - 05/20/20 0941    Clinical Impression Statement Patient continues to have significant pain following her left lumpectomy and sentinel node biopsy. She had a positive lymph node but a low Mammaprint. She will undergo radiation. PT was recommended to address pain and scar tissue but due to her travel concerns with living an hour away, she will try things at home instead and will call me if her pain persists. She will come every 3 months for L-Dex screens to detect subclinical  lymphedema as she will be a higher risk after axillary radiation. Shoulder ROM is back to baseline and she is limited only by pain.    PT Treatment/Interventions ADLs/Self Care Home Management;Therapeutic exercise;Patient/family education    PT Next Visit Plan D/C    PT Home Exercise Plan Post op shoulder ROM HEP and nerve stretch    Consulted and Agree with Plan of Care Patient           Patient will benefit from skilled therapeutic intervention in order to improve the following deficits and impairments:  Postural dysfunction, Decreased range of motion, Decreased knowledge of precautions, Impaired UE functional use, Pain  Visit Diagnosis: Malignant neoplasm of upper-inner quadrant of left breast in female, estrogen receptor positive (HCC)  Abnormal posture  Pain in left upper arm  Aftercare following surgery for neoplasm     Problem List Patient Active Problem List   Diagnosis Date Noted  . Genetic testing 04/29/2020  . Malignant neoplasm of upper-outer quadrant of right breast in female, estrogen receptor positive ([place]) 04/14/2020  . Family history of prostate cancer   . Family history of lung cancer   . Family history of skin cancer   . Malignant neoplasm of upper-inner quadrant of left breast in female, estrogen receptor  positive ([place]) 04/12/2020    PHYSICAL THERAPY DISCHARGE SUMMARY  Visits from Start of Care: 2  Current functional level related to goals / functional outcomes: See above for objective findings.   Remaining deficits: Pain in left lateral trunk proximally near axilla.   Education / Equipment: Lymphedema risk reduction and HEP Plan: Patient agrees to discharge.  Patient goals were partially met. Patient is being discharged due to the patient's request.  ?????         PT was recommended but it is too far for her to drive. If pain persists, she will call us and come to PT since she is coming to [facility] for radiation.  Annia Friendly,  [place] 05/20/20 9:52 AM   [place], [place], [address] Phone: [phone]   Fax:  [phone]  Name: CLARK CLOWDUS MRN: [id number] Date of Birth: 12-Mar-1956

## 2020-05-20 NOTE — Patient Instructions (Signed)
MEDIAN NERVE: Mobilization XI    Stand with right palm flat on wall, fingers back, elbow bent, head tilted away. Sidestep away from wall, straightening elbow. Do _1__ sets of _5__ repetitions, holding 5 seconds, twice a day.  Copyright  VHI. All rights reserved.              [facility]         [address], [place] [address]         [phone]         Annia Friendly, PT, CLT   After Breast Cancer Class It is recommended you attend the ABC class to be educated on lymphedema risk reduction. This class is free of charge and lasts for 1 hour. It is a 1-time class.  Since you don't have a phone or computer you can use, I'm going to give you the information in a written format.   Scar massage You can begin scar massage to both of your incisions with cocoa butter or coconut oil to reduce the hardness from scar tissue. Don't use any lotions or oil during radiation.  Compression garment I recommend you wear a good sports bra to reduce pain and swelling in your breast.   Home exercise Program Begin doing the nerve stretch given today and continue other stretches previously given.   Follow up PT: It is recommended you return every 3 months for the first 3 years following surgery to be assessed on the SOZO machine for an L-Dex score. This helps prevent clinically significant lymphedema in 95% of patients. These follow up screens are 15 minute appointments that you are not billed for. You are scheduled for December 6 at 9:30 am.

## 2020-05-25 ENCOUNTER — Encounter: Payer: Self-pay | Admitting: Radiation Oncology

## 2020-05-25 ENCOUNTER — Ambulatory Visit
Admission: RE | Admit: 2020-05-25 | Discharge: 2020-05-25 | Disposition: A | Discharge status: Home / Self Care | Payer: BC Managed Care – PPO | Source: Ambulatory Visit | Attending: Radiation Oncology | Admitting: Radiation Oncology

## 2020-05-25 DIAGNOSIS — C50212 Malignant neoplasm of upper-inner quadrant of left female breast: Secondary | ICD-10-CM

## 2020-05-25 DIAGNOSIS — Z170 Estrogen receptor positive status [ER+]: Secondary | ICD-10-CM

## 2020-05-25 NOTE — Progress Notes (Signed)
Radiation Oncology         [phone] ________________________________  Name: Christine Mosley MRN: [id number]  Date: 05/25/2020  DOB: [birth year]  Follow-Up Visit Note by telephone.  The patient opted for telemedicine to maximize safety during the pandemic.  MyChart video was not obtainable.   Outpatient  CC: Christine Halim., PA-C  Magrinat, Virgie Dad, MD  Diagnosis:      ICD-10-CM   1. Malignant neoplasm of upper-inner quadrant of left breast in female, estrogen receptor positive Christine Mosley)  C50.212    Z17.0    Cancer Staging Malignant neoplasm of upper-inner quadrant of left breast in female, estrogen receptor positive (Christine Mosley) Staging form: Breast, AJCC 8th Edition - Clinical stage from 04/14/2020: Stage IA (cT1b, cN0, cM0, G2, ER+, PR+, HER2-) - Unsigned - Pathologic stage from 04/27/2020: Stage IA (pT1b, pN1a, cM0, G1, ER+, PR+, HER2-) - Signed by Christine Phlegm, NP on 05/19/2020  CHIEF COMPLAINT: Here to discuss management of left breast cancer  Narrative:  The patient returns today for follow-up. She was seen in consultation on 04/14/2020, during which time we discussed post-operative radiation therapy.  At that time her lymph nodes were clinically negative.  Genetic testing was performed on the date of 04/14/2020 and was negative.  Breast/nodal surgery on date of 04/27/2020 revealed: tumor size of 0.7 cm; histology of invasive ductal carcinoma with low-grade ductal carcinoma in-situ; margin status to invasive disease of negative, although lobular neoplasia and incidental intraductal papilloma was found at the left medial margin; margin status to in situ disease of negative; nodal status of positive (one left axillary sentinel lymph node with metastatic carcinoma spanning 0.3 cm but without extranodal extension);  ER status: 95% moderate; PR status: 2% moderate, Her2 status: negative; Grade 1.  MammaPrint was low risk she will not receive chemotherapy.  Symptomatically, the  patient reports: Doing well  She previously received radiation therapy to her right breast under the care of Dr. Danny Lawless.        ALLERGIES:  is allergic to codeine.  Meds: Current Outpatient Medications  Medication Sig Dispense Refill   acetaminophen (TYLENOL) 325 MG tablet Take 650 mg by mouth every 6 (six) hours as needed.     ibuprofen (ADVIL) 600 MG tablet Take 600 mg by mouth every 6 (six) hours as needed.     traMADol (ULTRAM) 50 MG tablet Take 2 tablets (100 mg total) by mouth every 6 (six) hours as needed. (Patient not taking: Reported on 05/25/2020) 10 tablet 0   No current facility-administered medications for this encounter.    Physical Findings:  vitals were not taken for this visit. .     General: Alert and oriented, in no acute    Lab Findings: Lab Results  Component Value Date   WBC 7.4 04/14/2020   HGB 14.5 04/14/2020   HCT 43.4 04/14/2020   MCV 87.0 04/14/2020   PLT 274 04/14/2020    '@LASTCHEMISTRY' @  Radiographic Findings: NM Sentinel Node Inj-No Rpt (Breast)  Result Date: 04/27/2020 Sulfur colloid was injected by the nuclear medicine technologist for melanoma sentinel node.   MM Breast Surgical Specimen  Result Date: 04/27/2020 CLINICAL DATA:  Evaluate surgical specimen following lumpectomy for LEFT breast cancer. EXAM: SPECIMEN RADIOGRAPH OF THE LEFT BREAST COMPARISON:  Previous exam(s). FINDINGS: Status post excision of the LEFT breast. The radioactive seed and biopsy marker clip are present and completely intact. IMPRESSION: Specimen radiograph of the LEFT breast. Electronically Signed   By: Christine Mosley.D.  On: 04/27/2020 13:03   MM LT RADIOACTIVE SEED LOC MAMMO GUIDE  Result Date: 04/27/2020 CLINICAL DATA:  64 year old female for radioactive seed localization of LEFT breast cancer prior to lumpectomy. EXAM: MAMMOGRAPHIC GUIDED RADIOACTIVE SEED LOCALIZATION OF THE LEFT BREAST COMPARISON:  Previous exam(s). FINDINGS: Patient presents for radioactive  seed localization prior to . I met with the patient and we discussed the procedure of seed localization including benefits and alternatives. We discussed the high likelihood of a successful procedure. We discussed the risks of the procedure including infection, bleeding, tissue injury and further surgery. We discussed the low dose of radioactivity involved in the procedure. Informed, written consent was given. The usual time-out protocol was performed immediately prior to the procedure. Using mammographic guidance, sterile technique, 1% lidocaine and an I-125 radioactive seed, the RIBBON biopsy clip was localized using a SUPERIOR approach. The follow-up mammogram images confirm the seed in the expected location and were marked for Christine Mosley. Follow-up survey of the patient confirms presence of the radioactive seed. Order number of I-125 seed:  [id number]. Total activity:  7.408 millicuries.  Reference Date: 04/20/2020 The patient tolerated the procedure well and was released from the [place]. She was given instructions regarding seed removal. IMPRESSION: Radioactive seed localization LEFT breast. No apparent complications. Electronically Signed   By: Christine Mosley M.D.   On: 04/27/2020 09:07    Impression/Plan: Left Breast Cancer  We discussed adjuvant radiotherapy today.  We discussed 2 options today.  1 option would be treating the whole left breast and adjacent axilla with high tangents over 4 weeks.  The alternative would be treating the left breast, axilla, and supraclavicular region with a 4 field plan over 6 weeks.  We talked about the pros and cons of both treatment regimens.  She would like to proceed with a slightly more aggressive 4 field plan over 6 weeks.  I think this is a good choice for her, especially since only 1 sentinel lymph node was removed and the hormonal sensitivity is moderate rather than strong.  Although the 6-week 4 field plan would deliver more dose to her left lung, she  reports that she is a non-smoker which reduces the risk of significant lung irritation.  I reviewed the logistics, benefits, risks, and potential side effects of this treatment in detail. Risks may include but not necessary be limited to acute and late injury tissue in the radiation fields such as skin irritation (change in color/pigmentation, itching, dryness, pain, peeling). She may experience fatigue. We also discussed possible risk of long term cosmetic changes or scar tissue. There is also a smaller risk for lung toxicity, cardiac toxicity, brachial plexopathy, lymphedema, musculoskeletal changes, rib fragility or induction of a second malignancy, late chronic non-healing soft tissue wound.    The patient asked good questions which I answered to her satisfaction. She is enthusiastic about proceeding with treatment.  We will sign a consent form together at simulation which will take place on October 8.  On date of service, in total, I spent 30 minutes on this encounter.  This encounter was provided by telemedicine platform by telephone.  The patient opted for telemedicine to maximize safety during the pandemic.  MyChart video was not obtainable. The patient has given verbal consent for this type of encounter and has been advised to only accept a meeting of this type in a secure network environment. The attendants for this meeting include Eppie Gibson  and Rosalia Hammers.  During the encounter, Eppie Gibson  was located at [facility] Radiation Oncology Department.  NADYA HOPWOOD was located at home.  _____________________________________   Eppie Gibson, MD  This document serves as a record of services personally performed by Eppie Gibson, MD. It was created on his behalf by Clerance Lav, a trained medical scribe. The creation of this record is based on the scribe's personal observations and the provider's statements to them. This document has been checked and approved by the  attending provider.

## 2020-05-25 NOTE — Progress Notes (Signed)
Location of Breast Cancer: Malignant neoplasm of upper-inner quadrant of LEFT breast, estrogen receptor positive  Histology per Pathology Report:  04/27/2020 FINAL MICROSCOPIC DIAGNOSIS:  A. BREAST, LEFT, LUMPECTOMY:  - Invasive ductal carcinoma, Nottingham grade 1 of 3, 0.7 cm  - Ductal carcinoma in-situ, low-grade  - Calcifications associated with carcinoma  - See parts C-F for final margin status  - Previous biopsy site changes present  - See oncology table below  B. LYMPH NODE, LEFT AXILLARY, SENTINEL, BIOPSY:  - Metastatic carcinoma involving one lymph node (0.3 cm)  - No extranodal extension identified  C. BREAST, LEFT ADDITIONAL MEDIAL MARGIN, EXCISION:  - Lobular neoplasia and incidental intraductal papilloma  - Previous biopsy site changes  - No residual carcinoma identified  D. BREAST, LEFT ADDITIONAL LATERAL MARGIN, EXCISION:  - No residual carcinoma identified  E. BREAST, LEFT ADDITIONAL SUPERIOR MARGIN, EXCISION:  - No residual carcinoma identified  F. BREAST,LEFT ADDITIONAL ANTERIOR MARGIN, EXCISION:  - No residual carcinoma identified   Receptor Status: ER(95%), PR (2%), Her2-neu (negative), Ki-67(5%)  Did patient present with symptoms (if so, please note symptoms) or was this found on screening mammography?:  Routine screening mammography on 03/22/2020 showing a possible abnormality in the left breast. She underwent left diagnostic mammography with tomography and left breast ultrasonography at [facility] on 04/05/2020 showing: breast density category B; 7 mm mass at 11:30 in left breast; normal-appearing left axillary lymph nodes.  Past/Anticipated interventions by surgeon, if any: 04/27/2020 Dr. Rolm Bookbinder 1. Left breast radioactive seed guided lumpectomy 2. Left deep axillary sentinel lymph node biopsy  Past/Anticipated interventions by medical oncology, if any: Under care of Dr. Lurline Del (1) definitive surgery pending (2)  Oncotype to be obtained from the definitive surgical sample: Chemotherapy not anticipated  (a) 05/11/2020: Per Varney Biles Martini-Navigator: Received mammaprint results of low risk (3) adjuvant radiation (4) genetics testing  (a) 04/29/2020: GENETIC TEST RESULTS: Genetic testing reported out on 04/23/2020 through the [facility] Common Hereditary Cancers panel. No pathogenic variants were detected.  (5) antiestrogens   Lymphedema issues, if any:  None    Pain issues, if any:  Patient reports soreness along lateral incision where lymph nodes were removed. Denies any limitations in range of motion  SAFETY ISSUES:  Prior radiation? Yes--2005: Right Breast (Dr. Danny Lawless)  Pacemaker/ICD? No  Possible current pregnancy? No--hysterectomy 2009  Is the patient on methotrexate? No  Current Complaints / other details:  Nothing of note (1) status post right breast upper outer quadrant lumpectomy 04 19 2005 for a pT2 pN0, stage IB invasive breast cancer, estrogen and progesterone receptor positive, HER-2 not amplified             (a) a total of 2 right sentinel lymph nodes were removed             (b) status post adjuvant chemotherapy             (c) status post adjuvant radiation             (d) status post tamoxifen x5+ years

## 2020-05-28 ENCOUNTER — Ambulatory Visit
Admission: RE | Admit: 2020-05-28 | Discharge: 2020-05-28 | Disposition: A | Discharge status: Home / Self Care | Payer: BC Managed Care – PPO | Source: Ambulatory Visit | Attending: Radiation Oncology | Admitting: Radiation Oncology

## 2020-05-28 ENCOUNTER — Other Ambulatory Visit: Payer: Self-pay

## 2020-05-28 DIAGNOSIS — C773 Secondary and unspecified malignant neoplasm of axilla and upper limb lymph nodes: Secondary | ICD-10-CM | POA: Insufficient documentation

## 2020-05-28 DIAGNOSIS — C50212 Malignant neoplasm of upper-inner quadrant of left female breast: Secondary | ICD-10-CM | POA: Insufficient documentation

## 2020-05-28 DIAGNOSIS — Z170 Estrogen receptor positive status [ER+]: Secondary | ICD-10-CM | POA: Diagnosis not present

## 2020-05-28 DIAGNOSIS — Z510 Encounter for antineoplastic radiation therapy: Secondary | ICD-10-CM | POA: Insufficient documentation

## 2020-05-31 ENCOUNTER — Encounter: Payer: Self-pay | Admitting: *Deleted

## 2020-05-31 DIAGNOSIS — Z510 Encounter for antineoplastic radiation therapy: Secondary | ICD-10-CM | POA: Diagnosis not present

## 2020-06-02 ENCOUNTER — Ambulatory Visit
Admission: RE | Admit: 2020-06-02 | Payer: BC Managed Care – PPO | Source: Ambulatory Visit | Admitting: Radiation Oncology

## 2020-06-02 DIAGNOSIS — Z510 Encounter for antineoplastic radiation therapy: Secondary | ICD-10-CM | POA: Diagnosis not present

## 2020-06-03 ENCOUNTER — Ambulatory Visit
Admission: RE | Admit: 2020-06-03 | Discharge: 2020-06-03 | Disposition: A | Discharge status: Home / Self Care | Payer: BC Managed Care – PPO | Source: Ambulatory Visit | Attending: Radiation Oncology | Admitting: Radiation Oncology

## 2020-06-03 DIAGNOSIS — Z510 Encounter for antineoplastic radiation therapy: Secondary | ICD-10-CM | POA: Diagnosis not present

## 2020-06-04 ENCOUNTER — Ambulatory Visit
Admission: RE | Admit: 2020-06-04 | Discharge: 2020-06-04 | Disposition: A | Discharge status: Home / Self Care | Payer: BC Managed Care – PPO | Source: Ambulatory Visit | Attending: Radiation Oncology | Admitting: Radiation Oncology

## 2020-06-04 DIAGNOSIS — Z510 Encounter for antineoplastic radiation therapy: Secondary | ICD-10-CM | POA: Diagnosis not present

## 2020-06-07 ENCOUNTER — Ambulatory Visit
Admission: RE | Admit: 2020-06-07 | Discharge: 2020-06-07 | Disposition: A | Discharge status: Home / Self Care | Payer: BC Managed Care – PPO | Source: Ambulatory Visit | Attending: Radiation Oncology | Admitting: Radiation Oncology

## 2020-06-07 DIAGNOSIS — Z170 Estrogen receptor positive status [ER+]: Secondary | ICD-10-CM

## 2020-06-07 DIAGNOSIS — Z510 Encounter for antineoplastic radiation therapy: Secondary | ICD-10-CM | POA: Diagnosis not present

## 2020-06-07 DIAGNOSIS — C50212 Malignant neoplasm of upper-inner quadrant of left female breast: Secondary | ICD-10-CM

## 2020-06-07 MED ORDER — SONAFINE EX EMUL
1.0000 "application " | Freq: Two times a day (BID) | CUTANEOUS | Status: DC
  Administered 2020-06-07: 1 via TOPICAL

## 2020-06-07 MED ORDER — ALRA NON-METALLIC DEODORANT (RAD-ONC)
1.0000 "application " | Freq: Once | TOPICAL | Status: AC
  Administered 2020-06-07: 1 via TOPICAL

## 2020-06-07 NOTE — Progress Notes (Signed)

## 2020-06-08 ENCOUNTER — Ambulatory Visit
Admission: RE | Admit: 2020-06-08 | Discharge: 2020-06-08 | Disposition: A | Discharge status: Home / Self Care | Payer: BC Managed Care – PPO | Source: Ambulatory Visit | Attending: Radiation Oncology | Admitting: Radiation Oncology

## 2020-06-08 DIAGNOSIS — Z510 Encounter for antineoplastic radiation therapy: Secondary | ICD-10-CM | POA: Diagnosis not present

## 2020-06-09 ENCOUNTER — Other Ambulatory Visit: Payer: Self-pay

## 2020-06-09 ENCOUNTER — Ambulatory Visit
Admission: RE | Admit: 2020-06-09 | Discharge: 2020-06-09 | Disposition: A | Discharge status: Home / Self Care | Payer: BC Managed Care – PPO | Source: Ambulatory Visit | Attending: Radiation Oncology | Admitting: Radiation Oncology

## 2020-06-09 DIAGNOSIS — Z510 Encounter for antineoplastic radiation therapy: Secondary | ICD-10-CM | POA: Diagnosis not present

## 2020-06-10 ENCOUNTER — Ambulatory Visit
Admission: RE | Admit: 2020-06-10 | Discharge: 2020-06-10 | Disposition: A | Discharge status: Home / Self Care | Payer: BC Managed Care – PPO | Source: Ambulatory Visit | Attending: Radiation Oncology | Admitting: Radiation Oncology

## 2020-06-10 DIAGNOSIS — Z510 Encounter for antineoplastic radiation therapy: Secondary | ICD-10-CM | POA: Diagnosis not present

## 2020-06-11 ENCOUNTER — Ambulatory Visit
Admission: RE | Admit: 2020-06-11 | Discharge: 2020-06-11 | Disposition: A | Discharge status: Home / Self Care | Payer: BC Managed Care – PPO | Source: Ambulatory Visit | Attending: Radiation Oncology | Admitting: Radiation Oncology

## 2020-06-11 DIAGNOSIS — Z510 Encounter for antineoplastic radiation therapy: Secondary | ICD-10-CM | POA: Diagnosis not present

## 2020-06-14 ENCOUNTER — Other Ambulatory Visit: Payer: Self-pay

## 2020-06-14 ENCOUNTER — Ambulatory Visit
Admission: RE | Admit: 2020-06-14 | Discharge: 2020-06-14 | Disposition: A | Discharge status: Home / Self Care | Payer: BC Managed Care – PPO | Source: Ambulatory Visit | Attending: Radiation Oncology | Admitting: Radiation Oncology

## 2020-06-14 DIAGNOSIS — Z510 Encounter for antineoplastic radiation therapy: Secondary | ICD-10-CM | POA: Diagnosis not present

## 2020-06-15 ENCOUNTER — Ambulatory Visit
Admission: RE | Admit: 2020-06-15 | Discharge: 2020-06-15 | Disposition: A | Discharge status: Home / Self Care | Payer: BC Managed Care – PPO | Source: Ambulatory Visit | Attending: Radiation Oncology | Admitting: Radiation Oncology

## 2020-06-15 DIAGNOSIS — Z510 Encounter for antineoplastic radiation therapy: Secondary | ICD-10-CM | POA: Diagnosis not present

## 2020-06-16 ENCOUNTER — Ambulatory Visit
Admission: RE | Admit: 2020-06-16 | Discharge: 2020-06-16 | Disposition: A | Discharge status: Home / Self Care | Payer: BC Managed Care – PPO | Source: Ambulatory Visit | Attending: Radiation Oncology | Admitting: Radiation Oncology

## 2020-06-16 DIAGNOSIS — Z510 Encounter for antineoplastic radiation therapy: Secondary | ICD-10-CM | POA: Diagnosis not present

## 2020-06-17 ENCOUNTER — Ambulatory Visit
Admission: RE | Admit: 2020-06-17 | Discharge: 2020-06-17 | Disposition: A | Discharge status: Home / Self Care | Payer: BC Managed Care – PPO | Source: Ambulatory Visit | Attending: Radiation Oncology | Admitting: Radiation Oncology

## 2020-06-17 DIAGNOSIS — Z510 Encounter for antineoplastic radiation therapy: Secondary | ICD-10-CM | POA: Diagnosis not present

## 2020-06-18 ENCOUNTER — Ambulatory Visit
Admission: RE | Admit: 2020-06-18 | Discharge: 2020-06-18 | Disposition: A | Discharge status: Home / Self Care | Payer: BC Managed Care – PPO | Source: Ambulatory Visit | Attending: Radiation Oncology | Admitting: Radiation Oncology

## 2020-06-18 DIAGNOSIS — Z510 Encounter for antineoplastic radiation therapy: Secondary | ICD-10-CM | POA: Diagnosis not present

## 2020-06-21 ENCOUNTER — Ambulatory Visit
Admission: RE | Admit: 2020-06-21 | Discharge: 2020-06-21 | Disposition: A | Discharge status: Home / Self Care | Payer: BC Managed Care – PPO | Source: Ambulatory Visit | Attending: Radiation Oncology | Admitting: Radiation Oncology

## 2020-06-21 DIAGNOSIS — C50212 Malignant neoplasm of upper-inner quadrant of left female breast: Secondary | ICD-10-CM | POA: Insufficient documentation

## 2020-06-21 DIAGNOSIS — Z170 Estrogen receptor positive status [ER+]: Secondary | ICD-10-CM | POA: Diagnosis present

## 2020-06-22 ENCOUNTER — Other Ambulatory Visit: Payer: Self-pay

## 2020-06-22 ENCOUNTER — Ambulatory Visit
Admission: RE | Admit: 2020-06-22 | Discharge: 2020-06-22 | Disposition: A | Discharge status: Home / Self Care | Payer: BC Managed Care – PPO | Source: Ambulatory Visit | Attending: Radiation Oncology | Admitting: Radiation Oncology

## 2020-06-22 DIAGNOSIS — C50212 Malignant neoplasm of upper-inner quadrant of left female breast: Secondary | ICD-10-CM | POA: Diagnosis not present

## 2020-06-23 ENCOUNTER — Ambulatory Visit
Admission: RE | Admit: 2020-06-23 | Discharge: 2020-06-23 | Disposition: A | Discharge status: Home / Self Care | Payer: BC Managed Care – PPO | Source: Ambulatory Visit | Attending: Radiation Oncology | Admitting: Radiation Oncology

## 2020-06-23 DIAGNOSIS — C50212 Malignant neoplasm of upper-inner quadrant of left female breast: Secondary | ICD-10-CM | POA: Diagnosis not present

## 2020-06-24 ENCOUNTER — Ambulatory Visit
Admission: RE | Admit: 2020-06-24 | Discharge: 2020-06-24 | Disposition: A | Discharge status: Home / Self Care | Payer: BC Managed Care – PPO | Source: Ambulatory Visit | Attending: Radiation Oncology | Admitting: Radiation Oncology

## 2020-06-24 DIAGNOSIS — C50212 Malignant neoplasm of upper-inner quadrant of left female breast: Secondary | ICD-10-CM | POA: Diagnosis not present

## 2020-06-25 ENCOUNTER — Ambulatory Visit
Admission: RE | Admit: 2020-06-25 | Discharge: 2020-06-25 | Disposition: A | Discharge status: Home / Self Care | Payer: BC Managed Care – PPO | Source: Ambulatory Visit | Attending: Radiation Oncology | Admitting: Radiation Oncology

## 2020-06-25 DIAGNOSIS — C50212 Malignant neoplasm of upper-inner quadrant of left female breast: Secondary | ICD-10-CM | POA: Diagnosis not present

## 2020-06-28 ENCOUNTER — Other Ambulatory Visit: Payer: Self-pay | Admitting: Radiation Oncology

## 2020-06-28 ENCOUNTER — Ambulatory Visit
Admission: RE | Admit: 2020-06-28 | Discharge: 2020-06-28 | Disposition: A | Discharge status: Home / Self Care | Payer: BC Managed Care – PPO | Source: Ambulatory Visit | Attending: Radiation Oncology | Admitting: Radiation Oncology

## 2020-06-28 DIAGNOSIS — Z170 Estrogen receptor positive status [ER+]: Secondary | ICD-10-CM

## 2020-06-28 DIAGNOSIS — C50212 Malignant neoplasm of upper-inner quadrant of left female breast: Secondary | ICD-10-CM

## 2020-06-28 MED ORDER — SONAFINE EX EMUL
1.0000 "application " | Freq: Two times a day (BID) | CUTANEOUS | Status: DC
  Administered 2020-06-28: 1 via TOPICAL

## 2020-06-28 MED ORDER — GABAPENTIN 300 MG PO CAPS: 300.0000 mg | ORAL_CAPSULE | Freq: Three times a day (TID) | ORAL | 0 refills | Status: DC

## 2020-06-29 ENCOUNTER — Ambulatory Visit
Admission: RE | Admit: 2020-06-29 | Discharge: 2020-06-29 | Disposition: A | Discharge status: Home / Self Care | Payer: BC Managed Care – PPO | Source: Ambulatory Visit | Attending: Radiation Oncology | Admitting: Radiation Oncology

## 2020-06-29 DIAGNOSIS — C50212 Malignant neoplasm of upper-inner quadrant of left female breast: Secondary | ICD-10-CM | POA: Diagnosis not present

## 2020-06-30 ENCOUNTER — Ambulatory Visit
Admission: RE | Admit: 2020-06-30 | Discharge: 2020-06-30 | Disposition: A | Discharge status: Home / Self Care | Payer: BC Managed Care – PPO | Source: Ambulatory Visit | Attending: Radiation Oncology | Admitting: Radiation Oncology

## 2020-06-30 DIAGNOSIS — C50212 Malignant neoplasm of upper-inner quadrant of left female breast: Secondary | ICD-10-CM | POA: Diagnosis not present

## 2020-07-01 ENCOUNTER — Ambulatory Visit
Admission: RE | Admit: 2020-07-01 | Discharge: 2020-07-01 | Disposition: A | Discharge status: Home / Self Care | Payer: BC Managed Care – PPO | Source: Ambulatory Visit | Attending: Radiation Oncology | Admitting: Radiation Oncology

## 2020-07-01 DIAGNOSIS — C50212 Malignant neoplasm of upper-inner quadrant of left female breast: Secondary | ICD-10-CM | POA: Diagnosis not present

## 2020-07-02 ENCOUNTER — Ambulatory Visit
Admission: RE | Admit: 2020-07-02 | Discharge: 2020-07-02 | Disposition: A | Discharge status: Home / Self Care | Payer: BC Managed Care – PPO | Source: Ambulatory Visit | Attending: Radiation Oncology | Admitting: Radiation Oncology

## 2020-07-02 DIAGNOSIS — C50212 Malignant neoplasm of upper-inner quadrant of left female breast: Secondary | ICD-10-CM | POA: Diagnosis not present

## 2020-07-05 ENCOUNTER — Ambulatory Visit
Admission: RE | Admit: 2020-07-05 | Discharge: 2020-07-05 | Disposition: A | Discharge status: Home / Self Care | Payer: BC Managed Care – PPO | Source: Ambulatory Visit | Attending: Radiation Oncology | Admitting: Radiation Oncology

## 2020-07-05 DIAGNOSIS — Z170 Estrogen receptor positive status [ER+]: Secondary | ICD-10-CM

## 2020-07-05 DIAGNOSIS — C50212 Malignant neoplasm of upper-inner quadrant of left female breast: Secondary | ICD-10-CM | POA: Diagnosis not present

## 2020-07-05 MED ORDER — SILVER SULFADIAZINE 1 % EX CREA: TOPICAL_CREAM | Freq: Every day | CUTANEOUS | Status: DC

## 2020-07-06 ENCOUNTER — Ambulatory Visit
Admission: RE | Admit: 2020-07-06 | Discharge: 2020-07-06 | Disposition: A | Discharge status: Home / Self Care | Payer: BC Managed Care – PPO | Source: Ambulatory Visit | Attending: Radiation Oncology | Admitting: Radiation Oncology

## 2020-07-06 DIAGNOSIS — C50212 Malignant neoplasm of upper-inner quadrant of left female breast: Secondary | ICD-10-CM | POA: Diagnosis not present

## 2020-07-07 ENCOUNTER — Other Ambulatory Visit: Payer: Self-pay

## 2020-07-07 ENCOUNTER — Ambulatory Visit
Admission: RE | Admit: 2020-07-07 | Discharge: 2020-07-07 | Disposition: A | Discharge status: Home / Self Care | Payer: BC Managed Care – PPO | Source: Ambulatory Visit | Attending: Radiation Oncology | Admitting: Radiation Oncology

## 2020-07-07 DIAGNOSIS — C50212 Malignant neoplasm of upper-inner quadrant of left female breast: Secondary | ICD-10-CM | POA: Diagnosis not present

## 2020-07-08 ENCOUNTER — Ambulatory Visit
Admission: RE | Admit: 2020-07-08 | Discharge: 2020-07-08 | Disposition: A | Discharge status: Home / Self Care | Payer: BC Managed Care – PPO | Source: Ambulatory Visit | Attending: Radiation Oncology | Admitting: Radiation Oncology

## 2020-07-08 DIAGNOSIS — C50212 Malignant neoplasm of upper-inner quadrant of left female breast: Secondary | ICD-10-CM | POA: Diagnosis not present

## 2020-07-09 ENCOUNTER — Ambulatory Visit
Admission: RE | Admit: 2020-07-09 | Discharge: 2020-07-09 | Disposition: A | Discharge status: Home / Self Care | Payer: BC Managed Care – PPO | Source: Ambulatory Visit | Attending: Radiation Oncology | Admitting: Radiation Oncology

## 2020-07-09 DIAGNOSIS — C50212 Malignant neoplasm of upper-inner quadrant of left female breast: Secondary | ICD-10-CM | POA: Diagnosis not present

## 2020-07-12 ENCOUNTER — Other Ambulatory Visit: Payer: Self-pay

## 2020-07-12 ENCOUNTER — Other Ambulatory Visit: Payer: Self-pay | Admitting: Radiation Oncology

## 2020-07-12 ENCOUNTER — Ambulatory Visit
Admission: RE | Admit: 2020-07-12 | Discharge: 2020-07-12 | Disposition: A | Discharge status: Home / Self Care | Payer: BC Managed Care – PPO | Source: Ambulatory Visit | Attending: Radiation Oncology | Admitting: Radiation Oncology

## 2020-07-12 DIAGNOSIS — C50212 Malignant neoplasm of upper-inner quadrant of left female breast: Secondary | ICD-10-CM | POA: Diagnosis not present

## 2020-07-12 MED ORDER — TRAMADOL HCL 50 MG PO TABS
ORAL_TABLET | ORAL | 0 refills | Status: DC
Start: 2020-07-12 — End: 2020-07-26

## 2020-07-13 ENCOUNTER — Encounter: Payer: Self-pay | Admitting: *Deleted

## 2020-07-13 ENCOUNTER — Ambulatory Visit
Admission: RE | Admit: 2020-07-13 | Discharge: 2020-07-13 | Disposition: A | Discharge status: Home / Self Care | Payer: BC Managed Care – PPO | Source: Ambulatory Visit | Attending: Radiation Oncology | Admitting: Radiation Oncology

## 2020-07-13 DIAGNOSIS — C50212 Malignant neoplasm of upper-inner quadrant of left female breast: Secondary | ICD-10-CM | POA: Diagnosis not present

## 2020-07-14 ENCOUNTER — Ambulatory Visit
Admission: RE | Admit: 2020-07-14 | Discharge: 2020-07-14 | Disposition: A | Discharge status: Home / Self Care | Payer: BC Managed Care – PPO | Source: Ambulatory Visit | Attending: Radiation Oncology | Admitting: Radiation Oncology

## 2020-07-14 ENCOUNTER — Encounter: Payer: Self-pay | Admitting: Radiation Oncology

## 2020-07-14 DIAGNOSIS — C50212 Malignant neoplasm of upper-inner quadrant of left female breast: Secondary | ICD-10-CM | POA: Diagnosis not present

## 2020-07-23 ENCOUNTER — Other Ambulatory Visit: Payer: Self-pay

## 2020-07-23 DIAGNOSIS — Z170 Estrogen receptor positive status [ER+]: Secondary | ICD-10-CM

## 2020-07-23 DIAGNOSIS — C50212 Malignant neoplasm of upper-inner quadrant of left female breast: Secondary | ICD-10-CM

## 2020-07-25 NOTE — Progress Notes (Signed)
[place]  [phone] [phone]     ID: JYLA HOPF DOB: April 29, 1956  MR#: [id number]  HQI#:[id number]  Patient Care Team: Christine Mosley., PA-C as PCP - General (Family Medicine) Christine Kaufmann, RN as Oncology Nurse Navigator Christine Germany, RN as Oncology Nurse Navigator Christine Bookbinder, MD as Consulting Physician (General Surgery) Christine Mosley, Christine Dad, MD as Consulting Physician (Oncology) Christine Gibson, MD as Attending Physician (Radiation Oncology) Christine Fus, MD as Consulting Physician (Obstetrics and Gynecology) Christine Cabal, MD as Consulting Physician (Orthopedic Surgery) Christine Cruel, MD OTHER MD:  CHIEF COMPLAINT: Estrogen receptor positive invasive breast cancer  CURRENT TREATMENT: To start anastrozole   INTERVAL HISTORY: Christine Mosley returns today for follow up of her estrogen receptor positive breast cancer. She was evaluated in the multidisciplinary breast cancer clinic on 04/14/2020.  Genetic testing obtained during clinic returned negative results, with the exception of a variant of uncertain significance in POLE.  She underwent left lumpectomy on 04/27/2020 under Dr. Donne Mosley. Pathology from the procedure [id number]) showed: invasive ductal carcinoma, grade 1, 0.7 cm; ductal carcinoma in situ, low grade; calcifications associated with carcinoma; final margins negative.  The biopsied left axillary lymph node showed metastatic carcinoma (0.3 cm) with no extranodal extension identified.  She was referred back to Dr. Isidore Mosley on 05/25/2020 to discuss radiation therapy. She subsequently received treatment from 06/03/2020 through 07/14/2020.   REVIEW OF SYSTEMS: [name] did generally well with radiation.  She did have significant desquamation and she still has soreness and sharp pain in the lateral inferior aspect of the left breast.  There is some skin coarsening that bothers her.  There is a "grabbing" pain in the  medial aspect of the left breast.  She has been taking ibuprofen 200 mg 2 or 3 times a day and also Tylenol, with very little relief.  She tells me her insurance will not pay for tramadol.  She tried gabapentin but it made her loopy she says.  Aside from these issues a detailed review of systems was stable.   COVID 19 VACCINATION STATUS: fully vaccinated [employer], second dose 05/05/2020)   HISTORY OF CURRENT ILLNESS: From prior intake note:  Christine Mosley has a history of stage pT2, pN0 right breast cancer, status post right lumpectomy and sentinel lymph node biopsy on 12/08/2003 showing: invasive ductal carcinoma, grade 2, 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 Estrogen and progesterone receptor both positive, and Her2 negative by FISH. Both axillary lymph nodes were negative for metastasis (0/2). She was treated with chemotherapy, radiation therapy, and tamoxifen. I last saw here in 06/2010 when she "graduated" from follow up.  More recently, she had routine screening mammography on 03/22/2020 showing a possible abnormality in the left breast. She underwent left diagnostic mammography with tomography and left breast ultrasonography at [facility] on 04/05/2020 showing: breast density category B; 7 mm mass at 11:30 in left breast; normal-appearing left axillary lymph nodes.  Accordingly on 04/06/2020 she proceeded to biopsy of the left breast area in question. The pathology from this procedure ([id number]) showed: invasive and in situ mammary carcinoma, e-cadherin positive, grade 2; calcifications. Prognostic indicators significant for: estrogen receptor, 95% positive and progesterone receptor, 2% positive, both with moderate staining intensity. Proliferation marker Ki67 at 5%. HER2 equivocal by immunohistochemistry (2+), but negative by fluorescent in situ hybridization with a signals ratio 1.23 and number per cell 1.9.  [name] subsequent history is as detailed  below.   PAST  MEDICAL HISTORY: Past Medical History:  Diagnosis Date   Breast cancer ([place]) 2005   right breast-lumpectomy, chemo. radiation   Breast cancer ([place]) 2021   left breast IDC with DCIS   Family history of lung cancer    Family history of prostate cancer    Family history of skin cancer    Personal history of chemotherapy    Personal history of radiation therapy     PAST SURGICAL HISTORY: Past Surgical History:  Procedure Laterality Date   ABDOMINAL HYSTERECTOMY     BREAST LUMPECTOMY Right 2005   BREAST LUMPECTOMY WITH RADIOACTIVE SEED AND SENTINEL LYMPH NODE BIOPSY Left 04/27/2020   Procedure: LEFT BREAST LUMPECTOMY WITH RADIOACTIVE SEED AND LEFT AXILLARY SENTINEL LYMPH NODE BIOPSY;  Surgeon: Christine Bookbinder, MD;  Location: [facility];  Service: General;  Laterality: Left;   SHOULDER SURGERY      FAMILY HISTORY: Family History  Problem Relation Age of Onset   Congestive Heart Failure Mother    Prostate cancer Father 51       metastatic   Lung cancer Sister 71   Liver cancer Maternal Aunt 58   Skin cancer Sister        multiple   Cancer Maternal Aunt        unknown type, dx. in her 61s   Lung cancer Cousin        paternal first cousin   Cancer Cousin        mouth cancer; paternal first cousin   Cancer Cousin        nose cancer; paternal first cousin   Her father died at age 50 from prostate cancer, which was diagnosed at age 16. Her mother died at age 53 from CHF. Christine Mosley has 2 sisters (and 0 brothers). She reports lung cancer in her sister, diagnosed at age 59; she succumbed to this disease.   GYNECOLOGIC HISTORY:  No LMP recorded. Patient has had a hysterectomy. Menarche: age unsure Age at first live birth: 64 years old [place] P 2 LMP 2005 Contraceptive: never used HRT never used  Hysterectomy? Yes, 01/2008, for uterine prolapse BSO? Yes (see report [place] is [phone]).   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 Husband Christine "Christine Mosley" is a dye cutter. She lives at home with husband Christine Mosley and son Christine Mosley, who is 52. Son Christine Mosley, age 71, is a Charity fundraiser in [place]. Christine Mosley has one granddaughter, age 5. She attends a [employer].    ADVANCED DIRECTIVES: In the absence of any documentation to the contrary, the patient's spouse is their HCPOA.    HEALTH MAINTENANCE: Social History   Tobacco Use   Smoking status: Never Smoker   Smokeless tobacco: Never Used  Vaping Use   Vaping Use: Never used  Substance Use Topics   Alcohol use: Never   Drug use: Never     Colonoscopy: never done, Cologuard in 2020  PAP: 03/2020, negative  Bone density: yes, date unknown   Allergies  Allergen Reactions   Codeine Nausea And Vomiting    Current Outpatient Medications  Medication Sig Dispense Refill   acetaminophen (TYLENOL) 325 MG tablet Take 650 mg by mouth every 6 (six) hours as needed.     anastrozole (ARIMIDEX) 1 MG tablet Take 1 tablet (1 mg total) by mouth daily. 90 tablet 4   cholecalciferol (VITAMIN D3) 25 MCG (1000 UNIT) tablet Take 1 tablet (1,000 Units total) by mouth daily. 90 tablet 4   gabapentin (NEURONTIN)  300 MG capsule Take 1 capsule (300 mg total) by mouth 3 (three) times daily. 90 capsule 0   ibuprofen (ADVIL) 600 MG tablet Take 600 mg by mouth every 6 (six) hours as needed.     traMADol (ULTRAM) 50 MG tablet Take 2 tablets every 6 hours prn pain. 60 tablet 0   No current facility-administered medications for this visit.    OBJECTIVE: White woman who appears stated age  76:   07/26/20 1107  BP: 117/69  Pulse: 77  Resp: 18  Temp: 98.2 F (36.8 C)  SpO2: 98%     Body mass index is 29.47 kg/m.   Wt Readings from Last 3 Encounters:  07/26/20 177 lb 1.6 oz (80.3 kg)  04/27/20 180 lb 8.9 oz (81.9 kg)  04/14/20 180 lb 12.8 oz (82 kg)      ECOG FS:1 - Symptomatic but completely ambulatory  Sclerae unicteric, EOMs intact Wearing a  mask No cervical or supraclavicular adenopathy Lungs no rales or rhonchi Heart regular rate and rhythm Abd soft, nontender, positive bowel sounds MSK no focal spinal tenderness, no upper extremity lymphedema Neuro: nonfocal, well oriented, appropriate affect Breasts: The right breast is status post lumpectomy and radiation.  There is no evidence of local recurrence.  Left breast is status post lumpectomy and radiation.  There is coarsening of the skin, some dry desquamation inferiorly, and some residual erythema but no evidence of local recurrence.  Both axillae are benign   LAB RESULTS:  CMP     Component Value Date/Time   NA 141 07/26/2020 1049   K 4.2 07/26/2020 1049   CL 107 07/26/2020 1049   CO2 28 07/26/2020 1049   GLUCOSE 117 (H) 07/26/2020 1049   BUN 8 07/26/2020 1049   CREATININE 0.83 07/26/2020 1049   CALCIUM 9.5 07/26/2020 1049   PROT 7.4 07/26/2020 1049   ALBUMIN 3.8 07/26/2020 1049   AST 15 07/26/2020 1049   ALT 16 07/26/2020 1049   ALKPHOS 68 07/26/2020 1049   BILITOT 0.6 07/26/2020 1049   GFRNONAA >60 07/26/2020 1049   GFRAA >60 04/14/2020 0824    No results found for: TOTALPROTELP, ALBUMINELP, A1GS, A2GS, BETS, BETA2SER, GAMS, MSPIKE, SPEI  Lab Results  Component Value Date   WBC 5.4 07/26/2020   NEUTROABS 3.4 07/26/2020   HGB 13.2 07/26/2020   HCT 38.8 07/26/2020   MCV 85.8 07/26/2020   PLT 293 07/26/2020    Lab Results  Component Value Date   LABCA2 17 07/04/2010    No components found for: ASTMHD622  No results for input(s): INR in the last 168 hours.  Lab Results  Component Value Date   LABCA2 17 07/04/2010    No results found for: WLN989  No results found for: QJJ941  No results found for: DEY814  No results found for: CA2729  No components found for: HGQUANT  No results found for: CEA1 / No results found for: CEA1   No results found for: AFPTUMOR  No results found for: CHROMOGRNA  No results found for: KPAFRELGTCHN,  LAMBDASER, KAPLAMBRATIO (kappa/lambda light chains)  No results found for: HGBA, HGBA2QUANT, HGBFQUANT, HGBSQUAN (Hemoglobinopathy evaluation)   Lab Results  Component Value Date   LDH 148 10/09/2006    No results found for: IRON, TIBC, IRONPCTSAT (Iron and TIBC)  No results found for: FERRITIN  Urinalysis    Component Value Date/Time   COLORURINE YELLOW 01/31/2008 [place] 01/31/2008 1217   LABSPEC 1.015 01/31/2008 [place]  6.0 01/31/2008 1217   GLUCOSEU NEGATIVE 01/31/2008 1217   HGBUR NEGATIVE 01/31/2008 1217   BILIRUBINUR NEGATIVE 01/31/2008 1217   KETONESUR NEGATIVE 01/31/2008 1217   PROTEINUR NEGATIVE 01/31/2008 1217   UROBILINOGEN 0.2 01/31/2008 1217   NITRITE NEGATIVE 01/31/2008 1217   LEUKOCYTESUR  01/31/2008 1217    NEGATIVE MICROSCOPIC NOT DONE ON URINES WITH NEGATIVE PROTEIN, BLOOD, LEUKOCYTES, NITRITE, OR GLUCOSE <1000 mg/dL.     STUDIES: No results found.   ELIGIBLE FOR AVAILABLE RESEARCH PROTOCOL: AET  ASSESSMENT: 64 y.o. [place] woman  (1) status post right breast upper outer quadrant lumpectomy 04 19 2005 for a pT2 pN0, stage IB invasive breast cancer, estrogen and progesterone receptor positive, HER-2 not amplified  (a) a total of 2 right sentinel lymph nodes were removed  (b) status post adjuvant chemotherapy  (c) status post adjuvant radiation  (d) status post tamoxifen x5+ years  (2) status post left breast upper inner quadrant biopsy 04/06/2020 for a clinical T1b N0, stage IA invasive ductal carcinoma, grade 2, E-cadherin positive, estrogen and progesterone receptor positive, HER-2 not amplified, with an MIB-1 of 5%  (3) status post left lumpectomy and sentinel lymph node sampling 04/27/2020 for a pT1b pN1, stage IB invasive ductal carcinoma, grade 1, with negative margins  (a) a total of 1 axillary lymph node was removed  (4) Oncotype to be obtained from the definitive surgical sample: Chemotherapy not  anticipated  (5) adjuvant radiation 06/03/2020 through 07/14/2020.  (6) genetics testing 04/23/2020 through the Common Hereditary Cancers Panel offered by [facility] found no deleterious mutations in APC, ATM, AXIN2, BARD1, BMPR1A, BRCA1, BRCA2, BRIP1, CDH1, CDK4, CDKN2A (p14ARF), CDKN2A (p16INK4a), CHEK2, CTNNA1, DICER1, EPCAM (Deletion/duplication testing only), GREM1 (promoter region deletion/duplication testing only), KIT, MEN1, MLH1, MSH2, MSH3, MSH6, MUTYH, NBN, NF1, NTHL1, PALB2, PDGFRA, PMS2, POLD1, POLE, PTEN, RAD50, RAD51C, RAD51D, RNF43, SDHB, SDHC, SDHD, SMAD4, SMARCA4. STK11, TP53, TSC1, TSC2, and VHL.  The following genes were evaluated for sequence changes only: SDHA and HOXB13 c.251G>A variant only.    (a) . A variant of uncertain significance (VUS) was detected in the POLE gene called c.6322G>T. T  (7) anastrozole started 07/26/2020  (a) bone density February 2020    PLAN:  Christine remains very uncomfortable from her radiation and surgical treatments.  She understands that this does not mean that she has active cancer, it is merely a result of the treatments themselves.  She is taking ibuprofen and Tylenol but not together.  I think if she took them at the same time she would get better results.  She is going to try that, namely ibuprofen 200-400 mg with Tylenol 500 mg 3 times a day as needed.  I also added Lidoderm patches.  We discussed antiestrogens in detail. '@PATIENTFIRSTNAME' @   has completed her local treatment and is now ready to start anti-estrogens.  We discussed the difference between tamoxifen and anastrozole in detail. She understands that anastrozole and the aromatase inhibitors in general work by blocking estrogen production. Accordingly vaginal dryness, decrease in bone density, and of course hot flashes can result. The aromatase inhibitors can also negatively affect the cholesterol profile, although that is a minor effect. One out of 5 women on aromatase inhibitors we  will feel "old and achy". This arthralgia/myalgia syndrome, which resembles fibromyalgia clinically, does resolve with stopping the medications. Accordingly this is not a reason to not try an aromatase inhibitor but it is a frequent reason to stop it (in other words 20% of women will not be  able to tolerate these medications).  Tamoxifen on the other hand does not block estrogen production. It does not "take away a woman's estrogen". It blocks the estrogen receptor in breast cells. Like anastrozole, it can also cause hot flashes. As opposed to anastrozole, tamoxifen has many estrogen-like effects. It is technically an estrogen receptor modulator. This means that in some tissues tamoxifen works like estrogen-- for example it helps strengthen the bones. It tends to improve the cholesterol profile. It can cause thickening of the endometrial lining, and even endometrial polyps or rarely cancer of the uterus.(The risk of uterine cancer due to tamoxifen is one additional cancer per thousand women year). It can cause vaginal wetness or stickiness. It can cause blood clots through this estrogen-like effect--the risk of blood clots with tamoxifen is exactly the same as with birth control pills or hormone replacement.  Neither of these agents causes mood changes or weight gain, despite the popular belief that they can have these side effects. We have data from studies comparing either of these drugs with placebo, and in those cases the control group had the same amount of weight gain and depression as the group that took the drug.  Since she had tamoxifen previously, we are going to try anastrozole this time.  She will start it today.  I am also starting her on vitamin D at the same time and I am setting her up for a bone density in February.  She will see me shortly after that  She knows to call for any other issue that may develop before the next visit  Total encounter time 40 minutes.Sarajane Jews C. Heba Ige,  MD 07/26/2020 11:39 AM Medical Oncology and Hematology [facility] [place], [place] [address] Tel. [phone]    Fax. [phone]   This document serves as a record of services personally performed by Lurline Del, MD. It was created on his behalf by Wilburn Mylar, a trained medical scribe. The creation of this record is based on the scribe's personal observations and the provider's statements to them.   I, Lurline Del MD, have reviewed the above documentation for accuracy and completeness, and I agree with the above.   *Total Encounter Time as defined by the Centers for Medicare and Medicaid Services includes, in addition to the face-to-face time of a patient visit (documented in the note above) non-face-to-face time: obtaining and reviewing outside history, ordering and reviewing medications, tests or procedures, care coordination (communications with other health care professionals or caregivers) and documentation in the medical record.

## 2020-07-26 ENCOUNTER — Ambulatory Visit: Payer: BC Managed Care – PPO | Attending: General Surgery | Admitting: Physical Therapy

## 2020-07-26 ENCOUNTER — Inpatient Hospital Stay (HOSPITAL_BASED_OUTPATIENT_CLINIC_OR_DEPARTMENT_OTHER): Payer: BC Managed Care – PPO | Admitting: Oncology

## 2020-07-26 ENCOUNTER — Other Ambulatory Visit: Payer: Self-pay

## 2020-07-26 ENCOUNTER — Inpatient Hospital Stay: Payer: BC Managed Care – PPO | Attending: Oncology

## 2020-07-26 VITALS — BP 117/69 | HR 77 | Temp 98.2°F | Resp 18 | Ht 65.0 in | Wt 177.1 lb

## 2020-07-26 DIAGNOSIS — C50212 Malignant neoplasm of upper-inner quadrant of left female breast: Secondary | ICD-10-CM | POA: Diagnosis not present

## 2020-07-26 DIAGNOSIS — C50411 Malignant neoplasm of upper-outer quadrant of right female breast: Secondary | ICD-10-CM | POA: Diagnosis not present

## 2020-07-26 DIAGNOSIS — Z170 Estrogen receptor positive status [ER+]: Secondary | ICD-10-CM | POA: Diagnosis not present

## 2020-07-26 DIAGNOSIS — Z79811 Long term (current) use of aromatase inhibitors: Secondary | ICD-10-CM | POA: Insufficient documentation

## 2020-07-26 DIAGNOSIS — Z79899 Other long term (current) drug therapy: Secondary | ICD-10-CM | POA: Insufficient documentation

## 2020-07-26 DIAGNOSIS — Z923 Personal history of irradiation: Secondary | ICD-10-CM | POA: Diagnosis not present

## 2020-07-26 DIAGNOSIS — Z483 Aftercare following surgery for neoplasm: Secondary | ICD-10-CM | POA: Insufficient documentation

## 2020-07-26 DIAGNOSIS — C773 Secondary and unspecified malignant neoplasm of axilla and upper limb lymph nodes: Secondary | ICD-10-CM | POA: Insufficient documentation

## 2020-07-26 LAB — CBC WITH DIFFERENTIAL (CANCER CENTER ONLY)
Abs Immature Granulocytes: 0.02 10*3/uL (ref 0.00–0.07)
Basophils Absolute: 0.1 10*3/uL (ref 0.0–0.1)
Basophils Relative: 1 %
Eosinophils Absolute: 0.2 10*3/uL (ref 0.0–0.5)
Eosinophils Relative: 3 %
HCT: 38.8 % (ref 36.0–46.0)
Hemoglobin: 13.2 g/dL (ref 12.0–15.0)
Immature Granulocytes: 0 %
Lymphocytes Relative: 20 %
Lymphs Abs: 1.1 10*3/uL (ref 0.7–4.0)
MCH: 29.2 pg (ref 26.0–34.0)
MCHC: 34 g/dL (ref 30.0–36.0)
MCV: 85.8 fL (ref 80.0–100.0)
Monocytes Absolute: 0.6 10*3/uL (ref 0.1–1.0)
Monocytes Relative: 12 %
Neutro Abs: 3.4 10*3/uL (ref 1.7–7.7)
Neutrophils Relative %: 64 %
Platelet Count: 293 10*3/uL (ref 150–400)
RBC: 4.52 MIL/uL (ref 3.87–5.11)
RDW: 12.8 % (ref 11.5–15.5)
WBC Count: 5.4 10*3/uL (ref 4.0–10.5)
nRBC: 0 % (ref 0.0–0.2)

## 2020-07-26 LAB — CMP (CANCER CENTER ONLY)
ALT: 16 U/L (ref 0–44)
AST: 15 U/L (ref 15–41)
Albumin: 3.8 g/dL (ref 3.5–5.0)
Alkaline Phosphatase: 68 U/L (ref 38–126)
Anion gap: 6 (ref 5–15)
BUN: 8 mg/dL (ref 8–23)
CO2: 28 mmol/L (ref 22–32)
Calcium: 9.5 mg/dL (ref 8.9–10.3)
Chloride: 107 mmol/L (ref 98–111)
Creatinine: 0.83 mg/dL (ref 0.44–1.00)
GFR, Estimated: >60 mL/min (ref >60)
Glucose, Bld: 117 mg/dL — ABNORMAL HIGH (ref 70–99)
Potassium: 4.2 mmol/L (ref 3.5–5.1)
Sodium: 141 mmol/L (ref 135–145)
Total Bilirubin: 0.6 mg/dL (ref 0.3–1.2)
Total Protein: 7.4 g/dL (ref 6.5–8.1)

## 2020-07-26 MED ORDER — ANASTROZOLE 1 MG PO TABS: 1.0000 mg | ORAL_TABLET | Freq: Every day | ORAL | 4 refills | Status: DC

## 2020-07-26 MED ORDER — LIDOCAINE 5 % EX PTCH: 1.0000 | MEDICATED_PATCH | CUTANEOUS | 2 refills | Status: AC

## 2020-07-26 MED ORDER — VITAMIN D 25 MCG (1000 UNIT) PO TABS: 1000.0000 [IU] | ORAL_TABLET | Freq: Every day | ORAL | 4 refills | Status: AC

## 2020-07-26 NOTE — Therapy (Signed)
[place], [place], [address] Phone: [phone]   Fax:  [phone]  Physical Therapy Treatment  Patient Details  Name: Christine Mosley MRN: [id number] Date of Birth: 12/09/1955 Referring Provider (PT): Dr. Rolm Bookbinder   Encounter Date: 07/26/2020   PT End of Session - 07/26/20 0949    Visit Number 2    Number of Visits 2    PT Start Time 0855    PT Stop Time 0930    PT Time Calculation (min) 35 min           Past Medical History:  Diagnosis Date  . Breast cancer ([facility]) 2005   right breast-lumpectomy, chemo. radiation  . Breast cancer ([facility]) 2021   left breast IDC with DCIS  . Family history of lung cancer   . Family history of prostate cancer   . Family history of skin cancer   . Personal history of chemotherapy   . Personal history of radiation therapy     Past Surgical History:  Procedure Laterality Date  . ABDOMINAL HYSTERECTOMY    . BREAST LUMPECTOMY Right 2005  . BREAST LUMPECTOMY WITH RADIOACTIVE SEED AND SENTINEL LYMPH NODE BIOPSY Left 04/27/2020   Procedure: LEFT BREAST LUMPECTOMY WITH RADIOACTIVE SEED AND LEFT AXILLARY SENTINEL LYMPH NODE BIOPSY;  Surgeon: Rolm Bookbinder, MD;  Location: [facility];  Service: General;  Laterality: Left;  . SHOULDER SURGERY      There were no vitals filed for this visit.   Subjective Assessment - 07/26/20 0948    Subjective Pt here for L-Dex screen                  L-DEX FLOWSHEETS - 07/26/20 0900      L-DEX LYMPHEDEMA SCREENING   Measurement Type Unilateral    L-DEX MEASUREMENT EXTREMITY Upper Extremity    POSITION  Standing    DOMINANT SIDE Left    At Risk Side Right    BASELINE SCORE (UNILATERAL) -2.6    L-DEX SCORE (UNILATERAL) 0.6    VALUE CHANGE (UNILAT) 3.2                                  PT Long Term Goals - 05/20/20 0944      PT LONG TERM GOAL #1   Title Patient will  demonstrate she has regained full shoulder ROM and function post operatively compared to baselines.    Time 8    Period Weeks    Status Partially Met                 Plan - 07/26/20 0948    Clinical Impression Statement Patient within acceptable range indicating no onset of subclinical lymphedema.           Patient will benefit from skilled therapeutic intervention in order to improve the following deficits and impairments:     Visit Diagnosis: Aftercare following surgery for neoplasm     Problem List Patient Active Problem List   Diagnosis Date Noted  . Genetic testing 04/29/2020  . Malignant neoplasm of upper-outer quadrant of right breast in female, estrogen receptor positive (B and E) 04/14/2020  . Family history of prostate cancer   . Family history of lung cancer   . Family history of skin cancer   . Malignant neoplasm of upper-inner quadrant of left breast in female, estrogen receptor positive ([facility]) 04/12/2020   Myra Gianotti  Tamala Julian, PT 07/26/20 9:50 AM  [facility], [place], [address] Phone: [phone]   Fax:  [phone]  Name: Christine Mosley MRN: [id number] Date of Birth: 1956/04/14

## 2020-07-28 ENCOUNTER — Telehealth: Payer: Self-pay | Admitting: Oncology

## 2020-07-28 NOTE — Telephone Encounter (Signed)
Scheduled appts per 12/6 los. Pt confirmed appt date and time.

## 2020-07-29 ENCOUNTER — Other Ambulatory Visit: Payer: Self-pay

## 2020-07-29 ENCOUNTER — Ambulatory Visit
Admission: RE | Admit: 2020-07-29 | Discharge: 2020-07-29 | Disposition: A | Discharge status: Home / Self Care | Payer: BC Managed Care – PPO | Source: Ambulatory Visit | Attending: Oncology | Admitting: Oncology

## 2020-07-29 DIAGNOSIS — Z170 Estrogen receptor positive status [ER+]: Secondary | ICD-10-CM

## 2020-07-29 DIAGNOSIS — C50411 Malignant neoplasm of upper-outer quadrant of right female breast: Secondary | ICD-10-CM

## 2020-07-29 DIAGNOSIS — C50212 Malignant neoplasm of upper-inner quadrant of left female breast: Secondary | ICD-10-CM

## 2020-08-02 ENCOUNTER — Other Ambulatory Visit: Payer: Self-pay

## 2020-08-02 ENCOUNTER — Encounter: Payer: Self-pay | Admitting: Occupational Therapy

## 2020-08-02 ENCOUNTER — Ambulatory Visit: Payer: BC Managed Care – PPO | Attending: Oncology | Admitting: Occupational Therapy

## 2020-08-02 DIAGNOSIS — N644 Mastodynia: Secondary | ICD-10-CM | POA: Diagnosis present

## 2020-08-02 DIAGNOSIS — Z483 Aftercare following surgery for neoplasm: Secondary | ICD-10-CM | POA: Insufficient documentation

## 2020-08-02 DIAGNOSIS — L905 Scar conditions and fibrosis of skin: Secondary | ICD-10-CM

## 2020-08-02 DIAGNOSIS — I890 Lymphedema, not elsewhere classified: Secondary | ICD-10-CM | POA: Diagnosis present

## 2020-08-02 NOTE — Therapy (Signed)
[facility] [address] [place], [address] Phone: [phone]   Fax:  [phone]  Occupational Therapy Treatment  Patient Details  Name: Christine Mosley MRN: [id number] Date of Birth: Jul 04, 1956 Referring Provider (OT): Magrinat, Sarajane Jews   Encounter Date: 08/02/2020   OT End of Session - 08/02/20 1653    Visit Number 1    Number of Visits 7    Date for OT Re-Evaluation 09/13/20    OT Start Time 0935    OT Stop Time 1031    OT Time Calculation (min) 56 min    Activity Tolerance Patient tolerated treatment well    Behavior During Therapy [facility] for tasks assessed/performed           Past Medical History:  Diagnosis Date  . Breast cancer ([place]) 2005   right breast-lumpectomy, chemo. radiation  . 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 Family history of lung cancer   . Family history of prostate cancer   . Family history of skin cancer   . Personal history of chemotherapy   . Personal history of radiation therapy     Past Surgical History:  Procedure Laterality Date  . ABDOMINAL HYSTERECTOMY    . BREAST LUMPECTOMY Right 2005  . BREAST LUMPECTOMY WITH RADIOACTIVE SEED AND SENTINEL LYMPH NODE BIOPSY Left 04/27/2020   Procedure: LEFT BREAST LUMPECTOMY WITH RADIOACTIVE SEED AND LEFT AXILLARY SENTINEL LYMPH NODE BIOPSY;  Surgeon: Rolm Bookbinder, MD;  Location: [facility];  Service: General;  Laterality: Left;  . SHOULDER SURGERY      There were no vitals filed for this visit.   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 She was evaluated in the multidisciplinary breast cancer clinic on  04/14/2020.     Genetic testing obtained during clinic returned negative results, with the exception of a variant of uncertain significance in POLE.     She underwent left lumpectomy on 04/27/2020 under Dr. Donne Hazel. 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     The biopsied left axillary lymph node showed metastatic carcinoma (0.3 cm) with no extranodal extension identified.     She was referred back to Dr. Isidore Moos on 05/25/2020 to discuss radiation therapy. She subsequently received treatment from 06/03/2020 through 07/14/2020.        REVIEW OF SYSTEMS:  Jacquline did generally well with radiation.  She did have significant desquamation and she still has soreness and sharp pain in the lateral inferior aspect of the left breast.  There is some skin coarsening that bothers her.  There is a "grabbing" pain in the medial aspect of the left breast.  She has been taking ibuprofen 200 mg 2 or 3 times a day and also Tylenol, with very little relief.  She tells me her insurance will not pay for tramadol.  She tried gabapentin but it made her loopy she says. refer to OT for L pain in breast    Patient Stated Goals I want the pain better so I can  do things without pain like sleeping on my side or stomach, drive    Currently in Pain? Yes    Pain Score 10-Worst pain ever   increase randomly day time or night time 10/10   Pain Location Breast    Pain Orientation Left    Pain Descriptors / Indicators Tender;Sharp;Shooting    Pain Type Surgical pain;Acute pain;Neuropathic pain    Pain Onset More than a month ago    Aggravating Factors  randomly              [facility] OT Assessment - 08/02/20 0001      Assessment   Medical Diagnosis L breast pain and lymphedema    Referring Provider (OT) Magrinat, Sarajane Jews    Onset Date/Surgical Date 04/27/20    Hand Dominance Right       Home  Environment   Lives With Spouse      Prior Function   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, needle point, sewing , house work      AROM   Overall AROM Comments bilateral shoulders WNL AROM - slight pull under arm L           LYMPHEDEMA/ONCOLOGY QUESTIONNAIRE - 08/02/20 0001      Right Upper Extremity Lymphedema   15 cm Proximal to Olecranon Process 32 cm    10 cm Proximal to Olecranon Process 30 cm    Olecranon Process 26 cm      Left Upper Extremity Lymphedema   15 cm Proximal to Olecranon Process 30 cm    10 cm Proximal to Olecranon Process 29 cm    Olecranon Process 25.8 cm            Pt ed on HEP -and done in clinic with pt - kept pain on breast under 5/10 - but scar tissue mobs - upon assessment 10/10  Scar massage to scar L axilla , Scar mobs around and soft tissue massage to L breast below nipple and L side of breast - where she feels fibrosis Light MLD from L to R axilla over breast - 15 reps   Jovipak unilateral breast pad to use as much as she can under bra or camisole  AAROM on wall for shoulder ABD and flexion - 10 reps  Hold 3 sec  2 x day -slight pull  Keep pain with soft tissue under 2/10               OT Education - 08/02/20 1653    Education Details findings of eval and HEP    Person(s) Educated Patient    Methods Explanation;Demonstration;Tactile cues;Verbal cues;Handout    Comprehension Verbal cues required;Returned demonstration;Verbalized understanding               OT Long Term Goals - 08/02/20 1700      OT LONG TERM GOAL #1   Title Pt to be independent in HEP for soft tissues manual therapy to L breast and MLD to decrease scar tissue , fibrosis and pain at the worse 5/10    Baseline pain and tenderness increase 10/10 over scar , lateral breast  with soft tissue and fibrosis techiques    Time 4    Period Weeks    Status New    Target Date 08/30/20      OT LONG TERM GOAL #2   Title Pt to show decrease size and heaviness in L  breast with pain less than 2/10 able to drive without pillow for cushioning    Baseline  10/10 pain - tenderness L breast lateral and below nipple - and driving and sitting with pillow for cushioning from L arm    Time 8    Period Weeks    Status New    Target Date 09/13/20                 Plan - 08/02/20 1654    Clinical Impression Statement Pt refer to OT 13 1/2 wks s/p L lumpectomy with radiation from 06/03/20 to 07/14/2020- pt report she had pain in L breast shooting and sharp since surgery but increase with radiation - lateral breast - pt do appear to have some scar tissue - tenderness at scar , lateral L breast and below nipple - 5-10/10 and tenderness - pt report driving with pillow to cushion her breast - and avoid use arm because of pain - pt L breast are enlarge compare to R - pt ed on HEP for MLD , soft tissue and fibrotic techniquess -and to pick up jovipak breast pad to start using - l shoulder AROM WNL with slight pull under arm when reaching over head- but no circumference measurements WNL if compare L and R - pt can benefit from OT services    OT Occupational Profile and History Problem Focused Assessment - Including review of records relating to presenting problem    Occupational performance deficits (Please refer to evaluation for details): ADL's;IADL's;Play;Leisure;Social Participation    Body Structure / Function / Physical Skills ADL;Edema;Decreased knowledge of precautions;Flexibility;ROM;UE functional use;Scar mobility;Pain;Strength;IADL    Rehab Potential Good    Clinical Decision Making Limited treatment options, no task modification necessary    Comorbidities Affecting Occupational Performance: None    Modification or Assistance to Complete Evaluation  No modification of tasks or assist necessary to complete eval    OT Frequency --   2 wks for 1 wks , 1 x wk for 5 wks   OT Duration 6 weeks    OT Treatment/Interventions Self-care/ADL training;Therapeutic  exercise;Manual lymph drainage;Patient/family education;Scar mobilization;Manual Therapy    Consulted and Agree with Plan of Care Patient           Patient will benefit from skilled therapeutic intervention in order to improve the following deficits and impairments:   Body Structure / Function / Physical Skills: ADL,Edema,Decreased knowledge of precautions,Flexibility,ROM,UE functional use,Scar mobility,Pain,Strength,IADL       Visit Diagnosis: Pain of left breast - Plan: Ot plan of care cert/re-cert  Scar tissue - Plan: Ot plan of care cert/re-cert  Lymphedema, not elsewhere classified - Plan: Ot plan of care cert/re-cert    Problem List Patient Active Problem List   Diagnosis Date Noted  . Genetic testing 04/29/2020  . Malignant neoplasm of upper-outer quadrant of right breast in female, estrogen receptor positive ([place]) 04/14/2020  . Family history of prostate cancer   . Family history of lung cancer   . Family history of skin cancer   . Malignant neoplasm of upper-inner quadrant of left breast in female, estrogen receptor positive ([place]) 04/12/2020    Rosalyn Gess OTR/l,CLT 08/02/2020, 5:21 PM  [facility] [address], [place], [address] Phone: [phone]   Fax:  [phone]  Name: Christine Mosley MRN: [id number] Date of Birth: April 03, 1956

## 2020-08-02 NOTE — Patient Instructions (Signed)
Scar massage to scar L axilla , Scar mobs around and soft tissue massage to L breast below nipple and L side of breast - where she feels fibrosis Light MLD from L to R axilla over breast - 15 reps   Jovipak unilateral breast pad to use as much as she can under bra or camisole  AAROM on wall for shoulder ABD and flexion - 10 reps  Hold 3 sec  2 x day -slight pull  Keep pain with soft tissue under 2/10

## 2020-08-03 ENCOUNTER — Other Ambulatory Visit: Payer: Self-pay | Admitting: *Deleted

## 2020-08-03 DIAGNOSIS — N6032 Fibrosclerosis of left breast: Secondary | ICD-10-CM

## 2020-08-09 ENCOUNTER — Ambulatory Visit: Payer: BC Managed Care – PPO | Admitting: Occupational Therapy

## 2020-08-09 ENCOUNTER — Other Ambulatory Visit: Payer: Self-pay

## 2020-08-09 DIAGNOSIS — L905 Scar conditions and fibrosis of skin: Secondary | ICD-10-CM

## 2020-08-09 DIAGNOSIS — Z483 Aftercare following surgery for neoplasm: Secondary | ICD-10-CM

## 2020-08-09 DIAGNOSIS — N644 Mastodynia: Secondary | ICD-10-CM

## 2020-08-09 DIAGNOSIS — I890 Lymphedema, not elsewhere classified: Secondary | ICD-10-CM

## 2020-08-09 NOTE — Therapy (Signed)
[facility] [address], [place], [address] Phone: [phone]   Fax:  [phone]  Occupational Therapy Treatment  Patient Details  Name: Christine Mosley MRN: [id number] Date of Birth: 12-Jul-1956 Referring Provider (OT): Magrinat, Sarajane Jews   Encounter Date: 08/09/2020   OT End of Session - 08/09/20 0843    Visit Number 2    Number of Visits 7    Date for OT Re-Evaluation 09/13/20    OT Start Time 0819    OT Stop Time 0844    OT Time Calculation (min) 25 min    Activity Tolerance Patient tolerated treatment well    Behavior During Therapy [facility] for tasks assessed/performed           Past Medical History:  Diagnosis Date  . Breast cancer ([place]) 2005   right breast-lumpectomy, chemo. radiation  . 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 Family history of lung cancer   . Family history of prostate cancer   . Family history of skin cancer   . Personal history of chemotherapy   . Personal history of radiation therapy     Past Surgical History:  Procedure Laterality Date  . ABDOMINAL HYSTERECTOMY    . BREAST LUMPECTOMY Right 2005  . BREAST LUMPECTOMY WITH RADIOACTIVE SEED AND SENTINEL LYMPH NODE BIOPSY Left 04/27/2020   Procedure: LEFT BREAST LUMPECTOMY WITH RADIOACTIVE SEED AND LEFT AXILLARY SENTINEL LYMPH NODE BIOPSY;  Surgeon: Rolm Bookbinder, MD;  Location: [facility];  Service: General;  Laterality: Left;  . SHOULDER SURGERY      There were no vitals filed for this visit.   Subjective Assessment - 08/09/20 0841    Subjective  Pain is better- now down to 7/10 at hte worse - shooting pain only on side of breast now and not as intense - i can feel breast smaller - when swinging my arm - now rubbing as much    Pertinent History estrogen receptor positive breast cancer. She was evaluated in the multidisciplinary breast cancer clinic on 04/14/2020.     Genetic testing  obtained during clinic returned negative results, with the exception of a variant of uncertain significance in POLE.     She underwent left lumpectomy on 04/27/2020 under Dr. Donne Hazel. 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     The biopsied left axillary lymph node showed metastatic carcinoma (0.3 cm) with no extranodal extension identified.     She was referred back to Dr. Isidore Moos on 05/25/2020 to discuss radiation therapy. She subsequently received treatment from 06/03/2020 through 07/14/2020.        REVIEW OF SYSTEMS:  Jerra did generally well with radiation.  She did have significant desquamation and she still has soreness and sharp pain in the lateral inferior aspect of the left breast.  There is some skin coarsening that bothers her.  There is a "grabbing" pain in the medial aspect of the left breast.  She has been taking ibuprofen 200 mg 2 or 3 times a day and also Tylenol, with very little relief.  She tells me her insurance will not pay for tramadol.  She tried gabapentin but it made her loopy she says. refer to OT for L pain in breast    Patient Stated Goals I want the pain better so I can do things  without pain like sleeping on my side or stomach, drive    Currently in Pain? Yes    Pain Score 7     Pain Location Breast    Pain Orientation Left    Pain Descriptors / Indicators Tender;Stabbing;Shooting    Pain Type Acute pain;Surgical pain    Pain Onset More than a month ago    Pain Frequency Intermittent                pain at the most down to 7/10 from 10/10  Pt to keep pain at breast under under 5/10 -  Scar tissue mobs better this date - no pain except when pinch to pick up  Pt could tolerate mini massager on scar in axilla  Scar mobs around and soft tissue massage to L breast below nipple and L side of breast -fibrosis  decrease to only lateral breast now - could tolerate mini massager everywhere on breast except lateral breast - cont to do her fibrotic tech and fabricate chip bag to use on lateral breast under bra during day She had issues getting jovipak breastpad - for pt to hold off for week and will see if chip bag works  Cont with Light MLD from L to R axilla over breast - 15 reps     Cont AAROM on wall for shoulder ABD and flexion - 10 reps  Hold 3 sec  2 x day -slight pull - pt report stretch is better  Keep pain with soft tissue under 2/10                   OT Education - 08/09/20 0843    Education Details changes to HEP    Person(s) Educated Patient    Methods Explanation;Demonstration;Tactile cues;Verbal cues;Handout    Comprehension Verbal cues required;Returned demonstration;Verbalized understanding               OT Long Term Goals - 08/02/20 1700      OT LONG TERM GOAL #1   Title Pt to be independent in HEP for soft tissues manual therapy to L breast and MLD to decrease scar tissue , fibrosis and pain at the worse 5/10    Baseline pain and tenderness increase 10/10 over scar , lateral breast  with soft tissue and fibrosis techiques    Time 4    Period Weeks    Status New    Target Date 08/30/20      OT LONG TERM GOAL #2   Title Pt to show decrease size and heaviness in L breast with pain less than 2/10 able to drive without pillow for cushioning    Baseline 10/10 pain - tenderness L breast lateral and below nipple - and driving and sitting with pillow for cushioning from L arm    Time 8    Period Weeks    Status New    Target Date 09/13/20                 Plan - 08/09/20 0844    Clinical Impression Statement Pt is about 14 1/2 wks s/p L lumpectomy with radiation from 10/14 to 10/24 - she report pain intensity decrease to 7/10 from 10/10 and area is now only lateral breast - no above or into axilla- pt cont to have some fibrosis latearl breast  -fabricated chip bag for her to use some during day in combination with same HEP - and will follow up in week    OT  Occupational Profile and History Problem Focused Assessment - Including review of records relating to presenting problem    Occupational performance deficits (Please refer to evaluation for details): ADL's;IADL's;Play;Leisure;Social Participation    Body Structure / Function / Physical Skills ADL;Edema;Decreased knowledge of precautions;Flexibility;ROM;UE functional use;Scar mobility;Pain;Strength;IADL    Rehab Potential Good    Clinical Decision Making Limited treatment options, no task modification necessary    Comorbidities Affecting Occupational Performance: None    Modification or Assistance to Complete Evaluation  No modification of tasks or assist necessary to complete eval    OT Frequency 1x / week    OT Duration 6 weeks    OT Treatment/Interventions Self-care/ADL training;Therapeutic exercise;Manual lymph drainage;Patient/family education;Scar mobilization;Manual Therapy    Consulted and Agree with Plan of Care Patient           Patient will benefit from skilled therapeutic intervention in order to improve the following deficits and impairments:   Body Structure / Function / Physical Skills: ADL,Edema,Decreased knowledge of precautions,Flexibility,ROM,UE functional use,Scar mobility,Pain,Strength,IADL       Visit Diagnosis: Pain of left breast  Scar tissue  Lymphedema, not elsewhere classified  Aftercare following surgery for neoplasm    Problem List Patient Active Problem List   Diagnosis Date Noted  . Fibrosis, breast, left 08/03/2020  . Genetic testing 04/29/2020  . Malignant neoplasm of upper-outer quadrant of right breast in female, estrogen receptor positive ([place]) 04/14/2020  . Family history of prostate cancer   . Family history of lung cancer   . Family history of skin cancer   . Malignant neoplasm of upper-inner quadrant of left breast in  female, estrogen receptor positive ([place]) 04/12/2020    Rosalyn Gess OTR/L,CLT 08/09/2020, 8:46 AM  [facility] [address], [place], [address] Phone: [phone]   Fax:  [phone]  Name: Christine Mosley MRN: [id number] Date of Birth: July 01, 1956

## 2020-08-11 NOTE — Progress Notes (Signed)
  Patient Name: Christine Mosley MRN: [id number] DOB: April 30, 1956 Referring Physician: Bing Matter (Profile Not Attached) Date of Service: 07/14/2020 [facility], [place]                                                        End Of Treatment Note  Diagnoses: C50.212-Malignant neoplasm of upper-inner quadrant of left female breast  Cancer Staging: Cancer Staging Malignant neoplasm of upper-inner quadrant of left breast in female, estrogen receptor positive ([place]) Staging form: Breast, AJCC 8th Edition - Clinical stage from 04/14/2020: Stage IA (cT1b, cN0, cM0, G2, ER+, PR+, HER2-) - Unsigned - Pathologic stage from 04/27/2020: Stage IA (pT1b, pN1a, cM0, G1, ER+, PR+, HER2-) - Signed by Gardenia Phlegm, NP on 05/19/2020   Intent: Curative  Radiation Treatment Dates: 06/03/2020 through 07/14/2020 Site Technique Total Dose (Gy) Dose per Fx (Gy) Completed Fx Beam Energies  Breast, Left: Breast_Lt 3D 50/50 2 25/25 6X, 10X  Breast, Left: Breast_Lt_SCV_PAB 3D 50/50 2 25/25 10X, 15X  Breast, Left: Breast_Lt_Bst 3D 10/10 2 5/5 6X, 10X   Narrative: The patient tolerated radiation therapy relatively well.   Plan: The patient will follow-up with radiation oncology in 36mo, or as needed.  -----------------------------------  Eppie Gibson, MD

## 2020-08-17 ENCOUNTER — Other Ambulatory Visit: Payer: Self-pay

## 2020-08-17 ENCOUNTER — Ambulatory Visit: Payer: BC Managed Care – PPO | Admitting: Occupational Therapy

## 2020-08-17 DIAGNOSIS — L905 Scar conditions and fibrosis of skin: Secondary | ICD-10-CM

## 2020-08-17 DIAGNOSIS — Z483 Aftercare following surgery for neoplasm: Secondary | ICD-10-CM

## 2020-08-17 DIAGNOSIS — N644 Mastodynia: Secondary | ICD-10-CM

## 2020-08-17 DIAGNOSIS — I890 Lymphedema, not elsewhere classified: Secondary | ICD-10-CM

## 2020-08-17 NOTE — Therapy (Signed)
[facility] [address] [place], [address] Phone: [phone]   Fax:  [phone]  Occupational Therapy Treatment  Patient Details  Name: Christine Mosley MRN: [id number] Date of Birth: 1956/03/10 Referring Provider (OT): Magrinat, Raymond Gurney   Encounter Date: 08/17/2020   OT End of Session - 08/17/20 0846    Visit Number 3    Number of Visits 7    Date for OT Re-Evaluation 09/13/20    OT Start Time 0802    OT Stop Time 0837    OT Time Calculation (min) 35 min    Activity Tolerance Patient tolerated treatment well    Behavior During Therapy [facility] for tasks assessed/performed           Past Medical History:  Diagnosis Date  . Breast cancer (HCC) 2005   right breast-lumpectomy, chemo. radiation  . 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 Family history of lung cancer   . Family history of prostate cancer   . Family history of skin cancer   . Personal history of chemotherapy   . Personal history of radiation therapy     Past Surgical History:  Procedure Laterality Date  . ABDOMINAL HYSTERECTOMY    . BREAST LUMPECTOMY Right 2005  . BREAST LUMPECTOMY WITH RADIOACTIVE SEED AND SENTINEL LYMPH NODE BIOPSY Left 04/27/2020   Procedure: LEFT BREAST LUMPECTOMY WITH RADIOACTIVE SEED AND LEFT AXILLARY SENTINEL LYMPH NODE BIOPSY;  Surgeon: Emelia Loron, MD;  Location: [facility];  Service: General;  Laterality: Left;  . SHOULDER SURGERY      There were no vitals filed for this visit.   Subjective Assessment - 08/17/20 0845    Subjective  The pain is better- now about 4-5/10 at the worse and shooting pain not as often -my breast not as tender, I can sleep on my L side now - swelling under my arm also better- I helped my husband do the deck this past weekend -and used my L arm for painting    Pertinent History estrogen receptor positive breast cancer. She was evaluated in the  multidisciplinary breast cancer clinic on 04/14/2020.     Genetic testing obtained during clinic returned negative results, with the exception of a variant of uncertain significance in POLE.     She underwent left lumpectomy on 04/27/2020 under Dr. Dwain Sarna. 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     The biopsied left axillary lymph node showed metastatic carcinoma (0.3 cm) with no extranodal extension identified.     She was referred back to Dr. Basilio Cairo on 05/25/2020 to discuss radiation therapy. She subsequently received treatment from 06/03/2020 through 07/14/2020.        REVIEW OF SYSTEMS:  Annesha did generally well with radiation.  She did have significant desquamation and she still has soreness and sharp pain in the lateral inferior aspect of the left breast.  There is some skin coarsening that bothers her.  There is a "grabbing" pain in the medial aspect of the left breast.  She has been taking ibuprofen 200 mg 2 or 3 times a day and also Tylenol, with very little relief.  She tells me her insurance will not pay for tramadol.  She tried gabapentin but it made her loopy she says. refer to OT for L pain in breast  Patient Stated Goals I want the pain better so I can do things without pain like sleeping on my side or stomach, drive    Currently in Pain? Yes    Pain Score 5     Pain Location Breast    Pain Orientation Left    Pain Descriptors / Indicators Tender    Pain Type Surgical pain    Pain Onset More than a month ago    Pain Frequency Occasional               LYMPHEDEMA/ONCOLOGY QUESTIONNAIRE - 08/17/20 0001      Right Upper Extremity Lymphedema   15 cm Proximal to Olecranon Process 31.8 cm    10 cm Proximal to Olecranon Process 30 cm    Olecranon Process 26 cm      Left Upper Extremity Lymphedema   15 cm Proximal to  Olecranon Process 31 cm    10 cm Proximal to Olecranon Process 29.3 cm    Olecranon Process 26 cm                pain at the most down to 4-5/10 from 10/10 at eval on L breast Fibrosis decrease in L breast and tenderness - mostly at scar in axilla still  Pt to keep pain at breast under 3/10   Scar tissue mobs better this date - no pain except when pinch to pick up  Pt could tolerate mini massager on scar in axilla again on L breast  Great progress in fibrosis on lateral L breast now - could tolerate mini massager everywhere on breast- cont to do her fibrotic tech and fabricate chip bag as needed if feeling any fibrosis on L breast or tenderness She had issues getting jovipak breastpad 2 wks ago - for pt to hold off still and use chip bag- appear working well for her Cont with Light MLD from L to R axilla over breast - 15 reps     Cont AAROM on wall for shoulder ABD and flexion - 10 reps  Hold 3 sec  Add this date scapula squeezes 2 x day -slight pull - pt report stretch is better  Keep pain with soft tissue under 2/10            OT Education - 08/17/20 0846    Education Details changes to HEP    Person(s) Educated Patient    Methods Explanation;Demonstration;Tactile cues;Verbal cues;Handout    Comprehension Verbal cues required;Returned demonstration;Verbalized understanding               OT Long Term Goals - 08/02/20 1700      OT LONG TERM GOAL #1   Title Pt to be independent in HEP for soft tissues manual therapy to L breast and MLD to decrease scar tissue , fibrosis and pain at the worse 5/10    Baseline pain and tenderness increase 10/10 over scar , lateral breast  with soft tissue and fibrosis techiques    Time 4    Period Weeks    Status New    Target Date 08/30/20      OT LONG TERM GOAL #2   Title Pt to show decrease size and heaviness in L breast with pain less than 2/10 able to drive without pillow for cushioning    Baseline 10/10 pain -  tenderness L breast lateral and below nipple - and driving and sitting with pillow for cushioning from L arm    Time 8  Period Weeks    Status New    Target Date 09/13/20                 Plan - 08/17/20 0846    Clinical Impression Statement Pt is about 15 1/2 wks s/p L lumpectomy with radiation from 10/14 to 10/24 - she cont to show decrease pain , tenderness in L breast , at scars and under arm - pt still tender at scar in axilla - pain decrease now to about 4-5/10 - pt to cont to use chip bag as needed if fibrosis , and MLD from L to R across chest , scar massage - pt to cont with HEP for 2 wks and return for reassessment    OT Occupational Profile and History Problem Focused Assessment - Including review of records relating to presenting problem    Occupational performance deficits (Please refer to evaluation for details): ADL's;IADL's;Play;Leisure;Social Participation    Body Structure / Function / Physical Skills ADL;Edema;Decreased knowledge of precautions;Flexibility;ROM;UE functional use;Scar mobility;Pain;Strength;IADL    Rehab Potential Good    Clinical Decision Making Limited treatment options, no task modification necessary    Comorbidities Affecting Occupational Performance: None    Modification or Assistance to Complete Evaluation  No modification of tasks or assist necessary to complete eval    OT Frequency Biweekly    OT Duration 6 weeks    OT Treatment/Interventions Self-care/ADL training;Therapeutic exercise;Manual lymph drainage;Patient/family education;Scar mobilization;Manual Therapy    Consulted and Agree with Plan of Care Patient           Patient will benefit from skilled therapeutic intervention in order to improve the following deficits and impairments:   Body Structure / Function / Physical Skills: ADL,Edema,Decreased knowledge of precautions,Flexibility,ROM,UE functional use,Scar mobility,Pain,Strength,IADL       Visit Diagnosis: Pain of left  breast  Scar tissue  Lymphedema, not elsewhere classified  Aftercare following surgery for neoplasm    Problem List Patient Active Problem List   Diagnosis Date Noted  . Fibrosis, breast, left 08/03/2020  . Genetic testing 04/29/2020  . Malignant neoplasm of upper-outer quadrant of right breast in female, estrogen receptor positive (HCC) 04/14/2020  . Family history of prostate cancer   . Family history of lung cancer   . Family history of skin cancer   . Malignant neoplasm of upper-inner quadrant of left breast in female, estrogen receptor positive (HCC) 04/12/2020    Oletta Cohn  OTR/L,CLT 08/17/2020, 8:51 AM  [place] [facility] [address], [place], [address] Phone: [phone]   Fax:  [phone]  Name: DALIYAH SRAMEK MRN: [id number] Date of Birth: [birth year]

## 2020-08-24 ENCOUNTER — Telehealth: Payer: Self-pay

## 2020-08-24 NOTE — Telephone Encounter (Signed)
I called the patient today about her upcoming follow-up appointment in radiation oncology.   Given the state of the COVID-19 pandemic, concerning case numbers in our community, and guidance from [facility], I offered a phone assessment with the patient to determine if coming to the clinic was necessary. She accepted.  I let the patient know that I had spoken with Dr. Basilio Cairo, and she wanted them to know the importance of washing their hands for at least 20 seconds at a time, especially after going out in public, and before they eat.  Limit going out in public whenever possible. Do not touch your face, unless your hands are clean, such as when bathing. Get plenty of rest, eat well, and stay hydrated. Patient verbalized understanding and agreement.  The patient denies any symptomatic concerns. She reports her fatigue is starting to improve. She has started PT and reports positive progress/improvements in regards to her range of motion and swelling. She also states the pain/sensitivity to her breast has almost completely resolved. Specifically, she reports good healing of her skin in the radiation fields.  Skin is intact and almost back to baseline coloring.  I recommended that she continue skin care by applying oil or lotion with vitamin E to the skin in the radiation fields, BID, for 2 more months.    Continue follow-up with medical oncology - follow-up is scheduled on 10/18/2020 with Dr. Ruthann Cancer.  I explained that yearly mammograms are important for patients with intact breast tissue, and physical exams are important after mastectomy for patients that cannot undergo mammography. Patient verbalized understanding and agreement  I encouraged her to call if she had further questions or concerns about her healing. Otherwise, she will follow-up PRN in radiation oncology. Patient is pleased with this plan, and we will cancel her upcoming follow-up to reduce the risk of COVID-19  transmission.

## 2020-08-25 ENCOUNTER — Ambulatory Visit: Payer: BC Managed Care – PPO | Admitting: Radiation Oncology

## 2020-08-31 ENCOUNTER — Ambulatory Visit: Payer: BC Managed Care – PPO | Attending: Oncology | Admitting: Occupational Therapy

## 2020-08-31 ENCOUNTER — Other Ambulatory Visit: Payer: Self-pay

## 2020-08-31 DIAGNOSIS — N644 Mastodynia: Secondary | ICD-10-CM | POA: Diagnosis present

## 2020-08-31 DIAGNOSIS — L905 Scar conditions and fibrosis of skin: Secondary | ICD-10-CM | POA: Insufficient documentation

## 2020-08-31 DIAGNOSIS — I890 Lymphedema, not elsewhere classified: Secondary | ICD-10-CM | POA: Insufficient documentation

## 2020-08-31 NOTE — Therapy (Signed)
[place] [facility] [address], [place], [address] Phone: [phone]   Fax:  [phone]  Occupational Therapy Treatment  Patient Details  Name: Christine Mosley MRN: [id number] Date of Birth: 12-18-1955 Referring Provider (OT): Magrinat, Sarajane Jews   Encounter Date: 08/31/2020   OT End of Session - 08/31/20 0820    Visit Number 4    Number of Visits 7    Date for OT Re-Evaluation 09/13/20    OT Start Time 0805    OT Stop Time 0853    OT Time Calculation (min) 48 min    Activity Tolerance Patient tolerated treatment well    Behavior During Therapy [facility] for tasks assessed/performed           Past Medical History:  Diagnosis Date  . Breast cancer ([place]) 2005   right breast-lumpectomy, chemo. radiation  . Breast cancer ([place]) 2021   left breast IDC with DCIS  . Family history of lung cancer   . Family history of prostate cancer   . Family history of skin cancer   . Personal history of chemotherapy   . Personal history of radiation therapy     Past Surgical History:  Procedure Laterality Date  . ABDOMINAL HYSTERECTOMY    . BREAST LUMPECTOMY Right 2005  . BREAST LUMPECTOMY WITH RADIOACTIVE SEED AND SENTINEL LYMPH NODE BIOPSY Left 04/27/2020   Procedure: LEFT BREAST LUMPECTOMY WITH RADIOACTIVE SEED AND LEFT AXILLARY SENTINEL LYMPH NODE BIOPSY;  Surgeon: Rolm Bookbinder, MD;  Location: [place];  Service: General;  Laterality: Left;  . SHOULDER SURGERY      There were no vitals filed for this visit.   Subjective Assessment - 08/31/20 0819    Subjective  Doing definetly better - my pain is only about 10% of the day -and some pulling top of my breast -side of breast at times tender depending on what I done - but my R elbow is bothering my on that bone about month now- we were building our deck - I fell of the deck the other day - but did not hurt as much - no that high - about foot    Pertinent  History estrogen receptor positive breast cancer. She was evaluated in the multidisciplinary breast cancer clinic on 04/14/2020.     Genetic testing obtained during clinic returned negative results, with the exception of a variant of uncertain significance in POLE.     She underwent left lumpectomy on 04/27/2020 under Dr. Donne Hazel. 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     The biopsied left axillary lymph node showed metastatic carcinoma (0.3 cm) with no extranodal extension identified.     She was referred back to Dr. Isidore Moos on 05/25/2020 to discuss radiation therapy. She subsequently received treatment from 06/03/2020 through 07/14/2020.        REVIEW OF SYSTEMS:  Kileigh did generally well with radiation.  She did have significant desquamation and she still has soreness and sharp pain in the lateral inferior aspect of the left breast.  There is some skin coarsening that bothers her.  There is a "grabbing" pain in the medial aspect of the left breast.  She has been taking ibuprofen 200 mg 2 or 3 times a day and also Tylenol, with very little relief.  She tells me her insurance will not pay for tramadol.  She tried gabapentin but it made her loopy she says. refer to OT for L pain in breast    Patient Stated Goals I want the pain better so I can do things without pain like sleeping on my side or stomach, drive    Currently in Pain? Yes    Pain Score 2     Pain Location Breast    Pain Orientation Left    Pain Descriptors / Indicators Tender    Pain Type Surgical pain    Pain Radiating Towards 10% of time    Pain Onset More than a month ago    Pain Frequency Once a week               LYMPHEDEMA/ONCOLOGY QUESTIONNAIRE - 08/31/20 0001      Right Upper Extremity Lymphedema   15 cm Proximal to Olecranon Process 30 cm    10 cm Proximal to  Olecranon Process 29.4 cm    Olecranon Process 26 cm      Left Upper Extremity Lymphedema   15 cm Proximal to Olecranon Process 30 cm    10 cm Proximal to Olecranon Process 28.4 cm    Olecranon Process 26 cm         Pt report she lost some weight she thinks - bilateral measurements decrease- see flowsheet  Pt decrease to 2-5/10 - and only about 10% of time - at eval pain in L breast and under arm 10/10    Fibrosis decrease in L breast and tenderness - no fibrosis this date - pt do report some at times - but response great on her soft tissue massage And use of chip bag  L breast appear larger and heavier than R - pt report since surgery to R breast years ago -L was always larger - but feels fuller since this treatment of lumpectomy and radiation Pt to get new bra - she wears old bra's  Contacted Rep again at [employer] about coverage for Jovipak unilateral post mastectomy pad for night time use to clear lymp in breast   Pt to keep pain at breast under1-/10 Scar tissue mobs better this date - no pain except when pinch to pick up and scar mobs in upwards direction -still adhere that direction - pt to focus on that  Pt could tolerate mini massager on scar in axillaagain and lateral L breast  Cont withLight MLD from L to R axilla over breast - 15 reps     ContAAROM on wall for shoulder ABD and flexion - 10 reps  Hold 3 sec  and scapula squeezes 2 x day -slight pull- pt report stretch is better Keep pain with soft tissue under 1-2/10  Pt appear to have lateral epicondylitis on R - pain and tenderness at lateral epicondyle Pain with wrist extention and supination with grip Stretch mostly with extended arm and wrist flexion in neutral and pronation position Pt ed on doing heat , massage and ice massage Compensate by picking objects up with palm up - or in neutral with both hands palms Avoid gripping objects , pulling with palm down  She things she had counter force strap - pt  ed on applying and wearing correctly                OT Education - 08/31/20 0819    Education Details changes to HEP    Person(s) Educated Patient    Methods Explanation;Demonstration;Tactile cues;Verbal cues;Handout    Comprehension Verbal cues required;Returned demonstration;Verbalized understanding  OT Long Term Goals - 08/02/20 1700      OT LONG TERM GOAL #1   Title Pt to be independent in HEP for soft tissues manual therapy to L breast and MLD to decrease scar tissue , fibrosis and pain at the worse 5/10    Baseline pain and tenderness increase 10/10 over scar , lateral breast  with soft tissue and fibrosis techiques    Time 4    Period Weeks    Status New    Target Date 08/30/20      OT LONG TERM GOAL #2   Title Pt to show decrease size and heaviness in L breast with pain less than 2/10 able to drive without pillow for cushioning    Baseline 10/10 pain - tenderness L breast lateral and below nipple - and driving and sitting with pillow for cushioning from L arm    Time 8    Period Weeks    Status New    Target Date 09/13/20                 Plan - 08/31/20 0820    Clinical Impression Statement Pt is about 17 1/2 wks s/p L lumpectomy with radiation from 10/14 to 10/24 - pt report pain decrease from 10/10 to about 1-2/10 now - sometimes 5/10 but only about 10% of the time- no fibrosis in breast this date - scar feels great except stil adhere one direction - pt to focus on that- pain still increase 8/10 with that - pt L breast appear larger and heavier than R - pt report it was also larger since R breast surgery - but feels fuller at times- contacted again Rep this date about coverage of Jovipak unilateral post mastectomy pad to wear at night time- appear pt has Lateral epicondylitis on R    OT Occupational Profile and History Problem Focused Assessment - Including review of records relating to presenting problem    Occupational performance  deficits (Please refer to evaluation for details): ADL's;IADL's;Play;Leisure;Social Participation    Body Structure / Function / Physical Skills ADL;Edema;Decreased knowledge of precautions;Flexibility;ROM;UE functional use;Scar mobility;Pain;Strength;IADL    Rehab Potential Good    Clinical Decision Making Limited treatment options, no task modification necessary    Comorbidities Affecting Occupational Performance: None    Modification or Assistance to Complete Evaluation  No modification of tasks or assist necessary to complete eval    OT Frequency Biweekly    OT Duration 6 weeks    OT Treatment/Interventions Self-care/ADL training;Therapeutic exercise;Manual lymph drainage;Patient/family education;Scar mobilization;Manual Therapy    Consulted and Agree with Plan of Care Patient           Patient will benefit from skilled therapeutic intervention in order to improve the following deficits and impairments:   Body Structure / Function / Physical Skills: ADL,Edema,Decreased knowledge of precautions,Flexibility,ROM,UE functional use,Scar mobility,Pain,Strength,IADL       Visit Diagnosis: Pain of left breast  Scar tissue  Lymphedema, not elsewhere classified    Problem List Patient Active Problem List   Diagnosis Date Noted  . Fibrosis, breast, left 08/03/2020  . Genetic testing 04/29/2020  . Malignant neoplasm of upper-outer quadrant of right breast in female, estrogen receptor positive ([place]) 04/14/2020  . Family history of prostate cancer   . Family history of lung cancer   . Family history of skin cancer   . Malignant neoplasm of upper-inner quadrant of left breast in female, estrogen receptor positive ([place]) 04/12/2020    Sophie Quiles, Gwenette Greet OTR/l,CLT  08/31/2020, 10:54 AM  [place] [facility] [address] [place], [address] Phone: [phone]   Fax:  [phone]  Name: LANDY KANGAS MRN: [id number] Date  of Birth: [birth year]

## 2020-10-14 ENCOUNTER — Other Ambulatory Visit: Payer: Self-pay

## 2020-10-14 DIAGNOSIS — C50411 Malignant neoplasm of upper-outer quadrant of right female breast: Secondary | ICD-10-CM

## 2020-10-14 DIAGNOSIS — Z170 Estrogen receptor positive status [ER+]: Secondary | ICD-10-CM

## 2020-10-17 NOTE — Progress Notes (Addendum)
[facility]  [phone] [phone]     ID: Christine Mosley DOB: Aug 10, 1956  MR#: [id number]  FMB#:[id number]  Patient Care Team: [name], PA-C as PCP - General (Family Medicine) Mauro Kaufmann, RN as Oncology Nurse Navigator Rockwell Germany, RN as Oncology Nurse Navigator Rolm Bookbinder, MD as Consulting Physician (General Surgery) Alisabeth Selkirk, Virgie Dad, MD as Consulting Physician (Oncology) Eppie Gibson, MD as Attending Physician (Radiation Oncology) Maisie Fus, MD as Consulting Physician (Obstetrics and Gynecology) Sydnee Cabal, MD as Consulting Physician (Orthopedic Surgery) Chauncey Cruel, MD OTHER MD:  CHIEF COMPLAINT: Estrogen receptor positive invasive breast cancer  CURRENT TREATMENT: anastrozole   INTERVAL HISTORY: Christine Mosley returns today for follow up of her estrogen receptor positive breast cancer.   She was started on anastrozole at her last visit on 07/26/2020.  She is tolerating this well.  She had told me that she could not tolerate Arimidex but of course she is taking the generic which is slightly different and she is having only minimal hot flashes as her only side effect from this medication.  Since her last visit, she underwent bone density screening on 07/29/2020 showing a T-score of -1.1, which is considered osteopenic.   REVIEW OF SYSTEMS: Christine Mosley has significant sharp pains in the lateral aspect of her left breast very occasionally.  Most of the time this is very brief.  Sometimes it can last up to an hour.  She tells me it feels like a knife is plunging into her left breast.  She is not having significant problems with hot flashes and has not had any worsening arthralgias or myalgias.  She tells me that she has fallen twice since her last visit here, 1 time while working on her deck and another time when her shoelaces got tangled.  A detailed review of systems today was otherwise stable   COVID 19  VACCINATION STATUS: fully vaccinated [employer], second dose 05/05/2020)   HISTORY OF CURRENT ILLNESS: From prior intake note:  Christine Mosley has a history of stage pT2, pN0 right breast cancer, status post right lumpectomy and sentinel lymph node biopsy on 12/08/2003 showing: invasive ductal carcinoma, grade 2, 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 Estrogen and progesterone receptor both positive, and Her2 negative by FISH. Both axillary lymph nodes were negative for metastasis (0/2). She was treated with chemotherapy, radiation therapy, and tamoxifen. I last saw here in 06/2010 when she "graduated" from follow up.  More recently, she had routine screening mammography on 03/22/2020 showing a possible abnormality in the left breast. She underwent left diagnostic mammography with tomography and left breast ultrasonography at [facility] on 04/05/2020 showing: breast density category B; 7 mm mass at 11:30 in left breast; normal-appearing left axillary lymph nodes.  Accordingly on 04/06/2020 she proceeded to biopsy of the left breast area in question. The pathology from this procedure ([id number]) showed: invasive and in situ mammary carcinoma, e-cadherin positive, grade 2; calcifications. Prognostic indicators significant for: estrogen receptor, 95% positive and progesterone receptor, 2% positive, both with moderate staining intensity. Proliferation marker Ki67 at 5%. HER2 equivocal by immunohistochemistry (2+), but negative by fluorescent in situ hybridization with a signals ratio 1.23 and number per cell 1.9.  [name] subsequent history is as detailed below.   PAST MEDICAL HISTORY: Past Medical History:  Diagnosis Date  . Breast cancer ([place]) 2005   right breast-lumpectomy, chemo. radiation  . Breast cancer ([place]) 2021   left breast IDC  with DCIS  . Family history of lung cancer   . Family history of prostate cancer   . Family history of skin cancer   . Personal  history of chemotherapy   . Personal history of radiation therapy     PAST SURGICAL HISTORY: Past Surgical History:  Procedure Laterality Date  . ABDOMINAL HYSTERECTOMY    . BREAST LUMPECTOMY Right 2005  . BREAST LUMPECTOMY WITH RADIOACTIVE SEED AND SENTINEL LYMPH NODE BIOPSY Left 04/27/2020   Procedure: LEFT BREAST LUMPECTOMY WITH RADIOACTIVE SEED AND LEFT AXILLARY SENTINEL LYMPH NODE BIOPSY;  Surgeon: Rolm Bookbinder, MD;  Location: [facility];  Service: General;  Laterality: Left;  . SHOULDER SURGERY      FAMILY HISTORY: Family History  Problem Relation Age of Onset  . Congestive Heart Failure Mother   . Prostate cancer Father 48       metastatic  . Lung cancer Sister 49  . Liver cancer Maternal Aunt 85  . Skin cancer Sister        multiple  . Cancer Maternal Aunt        unknown type, dx. in her 3s  . Lung cancer Cousin        paternal first cousin  . Cancer Cousin        mouth cancer; paternal first cousin  . Cancer Cousin        nose cancer; paternal first cousin   Her father died at age 79 from prostate cancer, which was diagnosed at age 18. Her mother died at age 1 from CHF. Christine Mosley has 2 sisters (and 0 brothers). She reports lung cancer in her sister, diagnosed at age 70; she succumbed to this disease.   GYNECOLOGIC HISTORY:  No LMP recorded. Patient has had a hysterectomy. Menarche: age unsure Age at first live birth: 65 years old [place] P 2 LMP 2005 Contraceptive: never used HRT never used  Hysterectomy? Yes, 01/2008, for uterine prolapse BSO? Yes (see report [place] is [phone]).   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 Husband Arvin "Tommie Raymond" is a dye cutter. She lives at home with husband Tommie Raymond and son Ovid Curd, who is 63. Son Christia Reading, age 38, is a Charity fundraiser in [place]. Theodosia has one granddaughter, age 36. She attends a [employer] church.    ADVANCED DIRECTIVES: In the absence of any documentation to the contrary,  the patient's spouse is their HCPOA.    HEALTH MAINTENANCE: Social History   Tobacco Use  . Smoking status: Never Smoker  . Smokeless tobacco: Never Used  Vaping Use  . Vaping Use: Never used  Substance Use Topics  . Alcohol use: Never  . Drug use: Never     Colonoscopy: never done, Cologuard in 2020  PAP: 03/2020, negative  Bone density: yes, date unknown   Allergies  Allergen Reactions  . Codeine Nausea And Vomiting    Current Outpatient Medications  Medication Sig Dispense Refill  . acetaminophen (TYLENOL) 325 MG tablet Take 650 mg by mouth every 6 (six) hours as needed.    Marland Kitchen anastrozole (ARIMIDEX) 1 MG tablet Take 1 tablet (1 mg total) by mouth daily. 90 tablet 4  . cholecalciferol (VITAMIN D3) 25 MCG (1000 UNIT) tablet Take 1 tablet (1,000 Units total) by mouth daily. 90 tablet 4  . ibuprofen (ADVIL) 600 MG tablet Take 600 mg by mouth every 6 (six) hours as needed.    . lidocaine (LIDODERM) 5 % Place 1 patch onto the skin daily. Remove &  Discard patch within 12 hours or as directed by MD 30 patch 2   No current facility-administered medications for this visit.    OBJECTIVE: White woman in no acute distress  Vitals:   10/18/20 0919  BP: 114/70  Pulse: 79  Resp: 18  Temp: 97.9 F (36.6 C)  SpO2: 99%     Body mass index is 29.42 kg/m.   Wt Readings from Last 3 Encounters:  10/18/20 176 lb 12.8 oz (80.2 kg)  07/26/20 177 lb 1.6 oz (80.3 kg)  04/27/20 180 lb 8.9 oz (81.9 kg)      ECOG FS:1 - Symptomatic but completely ambulatory  Sclerae unicteric, EOMs intact Wearing a mask No cervical or supraclavicular adenopathy Lungs no rales or rhonchi Heart regular rate and rhythm Abd soft, nontender, positive bowel sounds MSK no focal spinal tenderness, no upper extremity lymphedema Neuro: nonfocal, well oriented, appropriate affect Breasts: The right breast is status post remote lumpectomy and radiation.  There is no evidence of local recurrence.  The left  breast is status post more recent lumpectomy and radiation.  It is larger than the right, and there remains mild duskiness.  There is minimal tenderness to deep palpation.  Both axillae are benign.   LAB RESULTS:  CMP     Component Value Date/Time   NA 139 10/18/2020 0859   K 4.1 10/18/2020 0859   CL 107 10/18/2020 0859   CO2 22 10/18/2020 0859   GLUCOSE 152 (H) 10/18/2020 0859   BUN 9 10/18/2020 0859   CREATININE 0.83 10/18/2020 0859   CALCIUM 9.4 10/18/2020 0859   PROT 7.7 10/18/2020 0859   ALBUMIN 4.2 10/18/2020 0859   AST 18 10/18/2020 0859   ALT 17 10/18/2020 0859   ALKPHOS 66 10/18/2020 0859   BILITOT 1.2 10/18/2020 0859   GFRNONAA >60 10/18/2020 0859   GFRAA >60 04/14/2020 0824    No results found for: TOTALPROTELP, ALBUMINELP, A1GS, A2GS, BETS, BETA2SER, GAMS, MSPIKE, SPEI  Lab Results  Component Value Date   WBC 9.6 10/18/2020   NEUTROABS 7.6 10/18/2020   HGB 14.0 10/18/2020   HCT 41.5 10/18/2020   MCV 86.5 10/18/2020   PLT 238 10/18/2020    Lab Results  Component Value Date   LABCA2 17 07/04/2010    No components found for: QPRFFM384  No results for input(s): INR in the last 168 hours.  Lab Results  Component Value Date   LABCA2 17 07/04/2010    No results found for: YKZ993  No results found for: TTS177  No results found for: LTJ030  No results found for: CA2729  No components found for: HGQUANT  No results found for: CEA1 / No results found for: CEA1   No results found for: AFPTUMOR  No results found for: CHROMOGRNA  No results found for: KPAFRELGTCHN, LAMBDASER, KAPLAMBRATIO (kappa/lambda light chains)  No results found for: HGBA, HGBA2QUANT, HGBFQUANT, HGBSQUAN (Hemoglobinopathy evaluation)   Lab Results  Component Value Date   LDH 148 10/09/2006    No results found for: IRON, TIBC, IRONPCTSAT (Iron and TIBC)  No results found for: FERRITIN  Urinalysis    Component Value Date/Time   COLORURINE YELLOW 01/31/2008 [place] 01/31/2008 1217   LABSPEC 1.015 01/31/2008 1217   PHURINE 6.0 01/31/2008 [place] 01/31/2008 [place] 01/31/2008 1217   [place] 01/31/2008 [place] 01/31/2008 [place] 01/31/2008 1217   UROBILINOGEN 0.2 01/31/2008 1217  NITRITE NEGATIVE 01/31/2008 1217   LEUKOCYTESUR  01/31/2008 1217    NEGATIVE MICROSCOPIC NOT DONE ON URINES WITH NEGATIVE PROTEIN, BLOOD, LEUKOCYTES, NITRITE, OR GLUCOSE <1000 mg/dL.    STUDIES: No results found.   ELIGIBLE FOR AVAILABLE RESEARCH PROTOCOL: AET  ASSESSMENT: 65 y.o.  [place] woman  (1) status post right breast upper outer quadrant lumpectomy 04 19 2005 for a pT2 pN0, stage IB invasive breast cancer, estrogen and progesterone receptor positive, HER-2 not amplified  (a) a total of 2 right sentinel lymph nodes were removed  (b) status post adjuvant chemotherapy  (c) status post adjuvant radiation  (d) status post tamoxifen x5+ years  (2) status post left breast upper inner quadrant biopsy 04/06/2020 for a clinical T1b N0, stage IA invasive ductal carcinoma, grade 2, E-cadherin positive, estrogen and progesterone receptor positive, HER-2 not amplified, with an MIB-1 of 5%  (3) status post left lumpectomy and sentinel lymph node sampling 04/27/2020 for a pT1b pN1, stage IB invasive ductal carcinoma, grade 1, with negative margins  (a) a total of 1 axillary lymph node was removed  (4) Oncotype to be obtained from the definitive surgical sample: Chemotherapy not anticipated  (5) adjuvant radiation 06/03/2020 through 07/14/2020.  (6) genetics testing 04/23/2020 through the Common Hereditary Cancers Panel offered by [facility] found no deleterious mutations in APC, ATM, AXIN2, BARD1, BMPR1A, BRCA1, BRCA2, BRIP1, CDH1, CDK4, CDKN2A (p14ARF), CDKN2A (p16INK4a), CHEK2, CTNNA1, DICER1, EPCAM (Deletion/duplication testing only), GREM1 (promoter region  deletion/duplication testing only), KIT, MEN1, MLH1, MSH2, MSH3, MSH6, MUTYH, NBN, NF1, NTHL1, PALB2, PDGFRA, PMS2, POLD1, POLE, PTEN, RAD50, RAD51C, RAD51D, RNF43, SDHB, SDHC, SDHD, SMAD4, SMARCA4. STK11, TP53, TSC1, TSC2, and VHL.  The following genes were evaluated for sequence changes only: SDHA and HOXB13 c.251G>A variant only.    (a) . A variant of uncertain significance (VUS) was detected in the POLE gene called c.6322G>T. T  (7) anastrozole started 07/26/2020  (a) bone density 07/30/2020 shows a T score of -1.1 (nearly normal).    PLAN:  Christine Mosley continues to have significant shooting pains in the lateral aspect of her left breast.  This is very intermittent but occasionally it lasts a little longer.  We discussed the fact that this is due to the surgery and radiation and does not indicate breast cancer recurrence.  I suggested for the more prolonged discomfort that she use ice.  If that does not suffice and if it seems to occur more frequently at night we can always add gabapentin at bedtime which can be helpful for this.  Also I wrote her a prescription so she may be able to obtain some bras.  Possibly a sports bra might be more comfortable for her  She has fallen twice.  This is not related to her breast cancer diagnosis or its treatment.  We discussed some strategies when she can avoid falling in the future.  Her bone density is very favorable, just about at the normal level, and this will be repeated in 2 years  Otherwise she will have her next mammogram in September and see me shortly after that  Total encounter time 25 minutes.Sarajane Jews C. Darryl Willner, MD 10/18/2020 9:44 AM Medical Oncology and Hematology [facility] [place], [place] [address] Tel. [phone]    Fax. [phone]   This document serves as a record of services personally performed by Lurline Del, MD. It was created on his behalf by Wilburn Mylar, a trained medical scribe.  The creation of this  record is based on the scribe's personal observations and the provider's statements to them.   I, Lurline Del MD, have reviewed the above documentation for accuracy and completeness, and I agree with the above.   *Total Encounter Time as defined by the Centers for Medicare and Medicaid Services includes, in addition to the face-to-face time of a patient visit (documented in the note above) non-face-to-face time: obtaining and reviewing outside history, ordering and reviewing medications, tests or procedures, care coordination (communications with other health care professionals or caregivers) and documentation in the medical record.

## 2020-10-18 ENCOUNTER — Inpatient Hospital Stay: Payer: BC Managed Care – PPO | Attending: Oncology | Admitting: Oncology

## 2020-10-18 ENCOUNTER — Other Ambulatory Visit: Payer: Self-pay

## 2020-10-18 ENCOUNTER — Inpatient Hospital Stay: Payer: BC Managed Care – PPO

## 2020-10-18 VITALS — BP 114/70 | HR 79 | Temp 97.9°F | Resp 18 | Ht 65.0 in | Wt 176.8 lb

## 2020-10-18 DIAGNOSIS — C50212 Malignant neoplasm of upper-inner quadrant of left female breast: Secondary | ICD-10-CM

## 2020-10-18 DIAGNOSIS — C50411 Malignant neoplasm of upper-outer quadrant of right female breast: Secondary | ICD-10-CM | POA: Diagnosis not present

## 2020-10-18 DIAGNOSIS — Z170 Estrogen receptor positive status [ER+]: Secondary | ICD-10-CM | POA: Diagnosis not present

## 2020-10-18 DIAGNOSIS — Z79811 Long term (current) use of aromatase inhibitors: Secondary | ICD-10-CM | POA: Diagnosis not present

## 2020-10-18 LAB — CMP (CANCER CENTER ONLY)
ALT: 17 U/L (ref 0–44)
AST: 18 U/L (ref 15–41)
Albumin: 4.2 g/dL (ref 3.5–5.0)
Alkaline Phosphatase: 66 U/L (ref 38–126)
Anion gap: 10 (ref 5–15)
BUN: 9 mg/dL (ref 8–23)
CO2: 22 mmol/L (ref 22–32)
Calcium: 9.4 mg/dL (ref 8.9–10.3)
Chloride: 107 mmol/L (ref 98–111)
Creatinine: 0.83 mg/dL (ref 0.44–1.00)
GFR, Estimated: >60 mL/min (ref >60)
Glucose, Bld: 152 mg/dL — ABNORMAL HIGH (ref 70–99)
Potassium: 4.1 mmol/L (ref 3.5–5.1)
Sodium: 139 mmol/L (ref 135–145)
Total Bilirubin: 1.2 mg/dL (ref 0.3–1.2)
Total Protein: 7.7 g/dL (ref 6.5–8.1)

## 2020-10-18 LAB — CBC WITH DIFFERENTIAL (CANCER CENTER ONLY)
Abs Immature Granulocytes: 0.02 10*3/uL (ref 0.00–0.07)
Basophils Absolute: 0 10*3/uL (ref 0.0–0.1)
Basophils Relative: 0 %
Eosinophils Absolute: 0.1 10*3/uL (ref 0.0–0.5)
Eosinophils Relative: 1 %
HCT: 41.5 % (ref 36.0–46.0)
Hemoglobin: 14 g/dL (ref 12.0–15.0)
Immature Granulocytes: 0 %
Lymphocytes Relative: 11 %
Lymphs Abs: 1.1 10*3/uL (ref 0.7–4.0)
MCH: 29.2 pg (ref 26.0–34.0)
MCHC: 33.7 g/dL (ref 30.0–36.0)
MCV: 86.5 fL (ref 80.0–100.0)
Monocytes Absolute: 0.8 10*3/uL (ref 0.1–1.0)
Monocytes Relative: 8 %
Neutro Abs: 7.6 10*3/uL (ref 1.7–7.7)
Neutrophils Relative %: 80 %
Platelet Count: 238 10*3/uL (ref 150–400)
RBC: 4.8 MIL/uL (ref 3.87–5.11)
RDW: 13.2 % (ref 11.5–15.5)
WBC Count: 9.6 10*3/uL (ref 4.0–10.5)
nRBC: 0 % (ref 0.0–0.2)

## 2020-10-19 ENCOUNTER — Telehealth: Payer: Self-pay | Admitting: Oncology

## 2020-10-19 NOTE — Telephone Encounter (Signed)
Scheduled appts per 2/28 los. Pt confirmed appt date and time.  

## 2020-10-28 ENCOUNTER — Telehealth: Payer: Self-pay | Admitting: Adult Health

## 2020-10-28 NOTE — Telephone Encounter (Signed)
Called pt to reschedule SCP visit per [facility] schedule change. Pt requested that the appt be cancelled. Pt stated that she did not see the point in coming to an appt like that especially with gas prices being as high as they are. I offered pt to do the appt as a virtual visit, pt declined that as well. Appt was cancelled and pt confirmed.

## 2020-11-01 ENCOUNTER — Ambulatory Visit: Payer: BC Managed Care – PPO

## 2020-11-15 ENCOUNTER — Encounter: Payer: BC Managed Care – PPO | Admitting: Adult Health

## 2021-05-15 NOTE — Progress Notes (Addendum)
[place]  Telephone:([phone]) [phone] Fax:(336) [phone]     ID: Christine Mosley DOB: August 21, 1956  MR#: [id number]  WYO#:[id number]  Patient Care Team: [name], PA-C as PCP - General (Family Medicine) Mauro Kaufmann, RN as Oncology Nurse Navigator Rockwell Germany, RN as Oncology Nurse Navigator Rolm Bookbinder, MD as Consulting Physician (General Surgery) Melessa Cowell, Virgie Dad, MD as Consulting Physician (Oncology) Eppie Gibson, MD as Attending Physician (Radiation Oncology) Maisie Fus, MD as Consulting Physician (Obstetrics and Gynecology) Sydnee Cabal, MD as Consulting Physician (Orthopedic Surgery) Chauncey Cruel, MD OTHER MD:  CHIEF COMPLAINT: Estrogen receptor positive invasive breast cancer  CURRENT TREATMENT: anastrozole   INTERVAL HISTORY: Christine Mosley returns today for follow up of her estrogen receptor positive breast cancer.   She started anastrozole on 07/26/2020.  She is tolerating this well.  She has some hot flashes but no significant problems with vaginal dryness.  She has not had arthralgias or myalgias related to this.  Her most recent bone density screening on 07/29/2020 showed a T-score of -1.1, which is minimally osteopenic.  She will be due for repeat December 2023  She underwent bilateral diagnostic mammography with tomography at [place] earlier this morning, 05/16/2021, showing: breast density category C; no evidence of malignancy in either breast.    REVIEW OF SYSTEMS: Christine Mosley tells me she had some nightmares last night and she is a little tired today but aside from that she says "everything is fine".  She and her husband walk about half a mile at least every day for exercise.  She continues to have discomfort in the left breast which is she says heavier and thicker than the one on the right and she understands at this is all due to her prior treatment.  It does not mean she has breast cancer there.  A detailed review of  systems today was stable    COVID 19 VACCINATION STATUS: Pfizer x2 (second dose 05/05/2020) as of September 2022   HISTORY OF CURRENT ILLNESS: From prior intake note:  Christine Mosley has a history of stage pT2, pN0 right breast cancer, status post right lumpectomy and sentinel lymph node biopsy on 12/08/2003 showing: invasive ductal carcinoma, grade 2, 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 Estrogen and progesterone receptor both positive, and Her2 negative by FISH. Both axillary lymph nodes were negative for metastasis (0/2). She was treated with chemotherapy, radiation therapy, and tamoxifen. I last saw here in 06/2010 when she "graduated" from follow up.  More recently, she had routine screening mammography on 03/22/2020 showing a possible abnormality in the left breast. She underwent left diagnostic mammography with tomography and left breast ultrasonography at [facility] on 04/05/2020 showing: breast density category B; 7 mm mass at 11:30 in left breast; normal-appearing left axillary lymph nodes.  Accordingly on 04/06/2020 she proceeded to biopsy of the left breast area in question. The pathology from this procedure ([id number]) showed: invasive and in situ mammary carcinoma, e-cadherin positive, grade 2; calcifications. Prognostic indicators significant for: estrogen receptor, 95% positive and progesterone receptor, 2% positive, both with moderate staining intensity. Proliferation marker Ki67 at 5%. HER2 equivocal by immunohistochemistry (2+), but negative by fluorescent in situ hybridization with a signals ratio 1.23 and number per cell 1.9.  [name] subsequent history is as detailed below.   PAST MEDICAL HISTORY: Past Medical History:  Diagnosis Date   Breast cancer ([place]) 2005   right breast-lumpectomy, chemo. radiation   Breast cancer (  [place]) 2021   left breast IDC with DCIS   Family history of lung cancer    Family history of prostate cancer     Family history of skin cancer    Personal history of chemotherapy    Personal history of radiation therapy     PAST SURGICAL HISTORY: Past Surgical History:  Procedure Laterality Date   ABDOMINAL HYSTERECTOMY     BREAST BIOPSY Left 04/06/2020   BREAST LUMPECTOMY Right 2005   BREAST LUMPECTOMY Left 04/27/2020   BREAST LUMPECTOMY WITH RADIOACTIVE SEED AND SENTINEL LYMPH NODE BIOPSY Left 04/27/2020   Procedure: LEFT BREAST LUMPECTOMY WITH RADIOACTIVE SEED AND LEFT AXILLARY SENTINEL LYMPH NODE BIOPSY;  Surgeon: Rolm Bookbinder, MD;  Location: [facility];  Service: General;  Laterality: Left;   SHOULDER SURGERY      FAMILY HISTORY: Family History  Problem Relation Age of Onset   Congestive Heart Failure Mother    Prostate cancer Father 25       metastatic   Lung cancer Sister 70   Liver cancer Maternal Aunt 58   Skin cancer Sister        multiple   Cancer Maternal Aunt        unknown type, dx. in her 110s   Lung cancer Cousin        paternal first cousin   Cancer Cousin        mouth cancer; paternal first cousin   Cancer Cousin        nose cancer; paternal first cousin   Her father died at age 30 from prostate cancer, which was diagnosed at age 110. Her mother died at age 77 from CHF. Christine Mosley has 2 sisters (and 0 brothers). She reports lung cancer in her sister, diagnosed at age 41; she succumbed to this disease.   GYNECOLOGIC HISTORY:  No LMP recorded. Patient has had a hysterectomy. Menarche: age unsure Age at first live birth: 65 years old [place] P 2 LMP 2005 Contraceptive: never used HRT never used  Hysterectomy? Yes, 01/2008, for uterine prolapse BSO? Yes (see report [place] is [phone]).   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 Husband Arvin "Tommie Raymond" is a dye cutter. She lives at home with husband Tommie Raymond and son Ovid Curd, who is 37. Son Christia Reading, age 21, is a Charity fundraiser in [place]. Christine Mosley has one granddaughter, age 22. She attends a  [employer], [employer] church.    ADVANCED DIRECTIVES: In the absence of any documentation to the contrary, the patient's spouse is their HCPOA.    HEALTH MAINTENANCE: Social History   Tobacco Use   Smoking status: Never   Smokeless tobacco: Never  Vaping Use   Vaping Use: Never used  Substance Use Topics   Alcohol use: Never   Drug use: Never     Colonoscopy: never done, Cologuard in 2020  PAP: 03/2020, negative  Bone density: yes, date unknown   Allergies  Allergen Reactions   Codeine Nausea And Vomiting    Current Outpatient Medications  Medication Sig Dispense Refill   acetaminophen (TYLENOL) 325 MG tablet Take 650 mg by mouth every 6 (six) hours as needed.     anastrozole (ARIMIDEX) 1 MG tablet Take 1 tablet (1 mg total) by mouth daily. 90 tablet 4   cholecalciferol (VITAMIN D3) 25 MCG (1000 UNIT) tablet Take 1 tablet (1,000 Units total) by mouth daily. 90 tablet 4   lidocaine (LIDODERM) 5 % Place 1 patch onto the skin daily. Remove & Discard patch  within 12 hours or as directed by MD 30 patch 2   No current facility-administered medications for this visit.    OBJECTIVE: White woman who appears stated  Vitals:   05/16/21 1001  BP: 135/75  Pulse: 70  Resp: 18  Temp: 97.7 F (36.5 C)  SpO2: 98%      Body mass index is 29.07 kg/m.   Wt Readings from Last 3 Encounters:  05/16/21 174 lb 11.2 oz (79.2 kg)  10/18/20 176 lb 12.8 oz (80.2 kg)  07/26/20 177 lb 1.6 oz (80.3 kg)      ECOG FS:1 - Symptomatic but completely ambulatory  Sclerae unicteric, EOMs intact Wearing a mask No cervical or supraclavicular adenopathy Lungs no rales or rhonchi Heart regular rate and rhythm Abd soft, nontender, positive bowel sounds MSK no focal spinal tenderness, no upper extremity lymphedema Neuro: nonfocal, well oriented, appropriate affect Breasts: The right breast has undergone remote lumpectomy and radiation.  There is no evidence of local recurrence.  Left breast is status  post lumpectomy and radiation as well.  It is indeed a bit larger and firmer than the one on the right and there is more skin coarsening.  There is no evidence of local recurrence.  Both axillae are benign.   LAB RESULTS:  CMP     Component Value Date/Time   NA 141 05/16/2021 0923   K 4.3 05/16/2021 0923   CL 107 05/16/2021 0923   CO2 22 05/16/2021 0923   GLUCOSE 158 (H) 05/16/2021 0923   BUN 11 05/16/2021 0923   CREATININE 0.81 05/16/2021 0923   CALCIUM 9.9 05/16/2021 0923   PROT 7.6 05/16/2021 0923   ALBUMIN 4.2 05/16/2021 0923   AST 19 05/16/2021 0923   ALT 20 05/16/2021 0923   ALKPHOS 65 05/16/2021 0923   BILITOT 1.0 05/16/2021 0923   GFRNONAA >60 05/16/2021 0923   GFRAA >60 04/14/2020 0824    No results found for: Ronnald Ramp, A1GS, A2GS, BETS, BETA2SER, GAMS, MSPIKE, SPEI  Lab Results  Component Value Date   WBC 6.1 05/16/2021   NEUTROABS 4.0 05/16/2021   HGB 14.0 05/16/2021   HCT 40.6 05/16/2021   MCV 85.1 05/16/2021   PLT 251 05/16/2021    Lab Results  Component Value Date   LABCA2 17 07/04/2010    No components found for: [id number]  No results for input(s): INR in the last 168 hours.  Lab Results  Component Value Date   LABCA2 17 07/04/2010    No results found for: MGN003  No results found for: BCW888  No results found for: BVQ945  No results found for: CA2729  No components found for: HGQUANT  No results found for: CEA1 / No results found for: CEA1   No results found for: AFPTUMOR  No results found for: CHROMOGRNA  No results found for: KPAFRELGTCHN, LAMBDASER, KAPLAMBRATIO (kappa/lambda light chains)  No results found for: HGBA, HGBA2QUANT, HGBFQUANT, HGBSQUAN (Hemoglobinopathy evaluation)   Lab Results  Component Value Date   LDH 148 10/09/2006    No results found for: IRON, TIBC, IRONPCTSAT (Iron and TIBC)  No results found for: FERRITIN  Urinalysis    Component Value Date/Time   COLORURINE YELLOW  01/31/2008 [place] 01/31/2008 1217   LABSPEC 1.015 01/31/2008 1217   PHURINE 6.0 01/31/2008 [place] 01/31/2008 [place] 01/31/2008 [place] 01/31/2008 [place] 01/31/2008 [place] 01/31/2008 1217  UROBILINOGEN 0.2 01/31/2008 1217   NITRITE NEGATIVE 01/31/2008 1217   LEUKOCYTESUR  01/31/2008 1217    NEGATIVE MICROSCOPIC NOT DONE ON URINES WITH NEGATIVE PROTEIN, BLOOD, LEUKOCYTES, NITRITE, OR GLUCOSE <1000 mg/dL.    STUDIES: MM DIAG BREAST TOMO BILATERAL  Result Date: 05/16/2021 CLINICAL DATA:  65 year old female presenting for annual exam. History of left breast cancer in 2021 status post lumpectomy. Patient also has a history of right breast cancer in 2005 status post lumpectomy. No new problems. EXAM: DIGITAL DIAGNOSTIC BILATERAL MAMMOGRAM WITH TOMOSYNTHESIS AND CAD TECHNIQUE: Bilateral digital diagnostic mammography and breast tomosynthesis was performed. The images were evaluated with computer-aided detection. COMPARISON:  Previous exam(s). ACR Breast Density Category c: The breast tissue is heterogeneously dense, which may obscure small masses. FINDINGS: Right breast: There are postsurgical changes from prior lumpectomy. No suspicious mass, distortion, or microcalcifications are identified to suggest presence of malignancy. Left breast: A spot 2D magnification view of the lumpectomy site was performed in addition to standard views. There are expected postsurgical changes. No suspicious mass, distortion, or microcalcifications are identified to suggest presence of malignancy. IMPRESSION: Bilateral postsurgical changes. No mammographic evidence of malignancy. RECOMMENDATION: Diagnostic bilateral mammogram in 1 year. I have discussed the findings and recommendations with the patient. If applicable, a reminder letter will be sent to the patient regarding the next appointment. BI-RADS CATEGORY  2:  Benign. Electronically Signed   By: Audie Pinto M.D.   On: 05/16/2021 08:28     ELIGIBLE FOR AVAILABLE RESEARCH PROTOCOL: AET  ASSESSMENT: 65 y.o. [place] woman  (1) status post right breast upper outer quadrant lumpectomy 04 19 2005 for a pT2 pN0, stage IB invasive breast cancer, estrogen and progesterone receptor positive, HER-2 not amplified  (a) a total of 2 right sentinel lymph nodes were removed  (b) status post adjuvant chemotherapy  (c) status post adjuvant radiation  (d) status post tamoxifen x5+ years  (2) status post left breast upper inner quadrant biopsy 04/06/2020 for a clinical T1b N0, stage IA invasive ductal carcinoma, grade 2, E-cadherin positive, estrogen and progesterone receptor positive, HER-2 not amplified, with an MIB-1 of 5%  (3) status post left lumpectomy and sentinel lymph node sampling 04/27/2020 for a pT1b pN1, stage IB invasive ductal carcinoma, grade 1, with negative margins  (a) a total of 1 axillary lymph node was removed  (4) MammaPrint obtained from the definitive surgical sample showed a luminal a tumor, low risk, with no significant benefit anticipated from chemotherapy  (5) adjuvant radiation 06/03/2020 through 07/14/2020.  (6) genetics testing 04/23/2020 through the Common Hereditary Cancers Panel offered by [facility] found no deleterious mutations in APC, ATM, AXIN2, BARD1, BMPR1A, BRCA1, BRCA2, BRIP1, CDH1, CDK4, CDKN2A (p14ARF), CDKN2A (p16INK4a), CHEK2, CTNNA1, DICER1, EPCAM (Deletion/duplication testing only), GREM1 (promoter region deletion/duplication testing only), KIT, MEN1, MLH1, MSH2, MSH3, MSH6, MUTYH, NBN, NF1, NTHL1, PALB2, PDGFRA, PMS2, POLD1, POLE, PTEN, RAD50, RAD51C, RAD51D, RNF43, SDHB, SDHC, SDHD, SMAD4, SMARCA4. STK11, TP53, TSC1, TSC2, and VHL.  The following genes were evaluated for sequence changes only: SDHA and HOXB13 c.251G>A variant only.    (a) . A variant of uncertain significance (VUS) was detected in the POLE  gene called c.6322G>T. T  (7) anastrozole started 07/26/2020  (a) bone density 07/30/2020 shows a T score of -1.1 (nearly normal).    PLAN:  Christine Mosley is now just over a year out from definitive surgery for her left side breast cancer with no evidence of disease recurrence.  This is favorable.  She is  tolerating anastrozole well and the plan is to continue that a total of 5 years.  She had thought she was on a "chemo pill" but they told her at mammography today that she was on a "hormone pill" and we reviewed that all over again today.  She understands this pill does not let her cancer eat estrogen so the cancer cells "starve".  She will be on this medication for 5 years  I commended her walking program and encouraged her to increase it.  She will see [place] again in a year the same day as her next mammogram.  Total encounter time 25 minutes.Sarajane Jews C. Maila Dukes, MD 05/16/2021 10:34 AM Medical Oncology and Hematology [facility] [place] [place], [place] [address] Tel. [phone]    Fax. [phone]   This document serves as a record of services personally performed by Lurline Del, MD. It was created on his behalf by Wilburn Mylar, a trained medical scribe. The creation of this record is based on the scribe's personal observations and the provider's statements to them.   I, Lurline Del MD, have reviewed the above documentation for accuracy and completeness, and I agree with the above.   *Total Encounter Time as defined by the Centers for Medicare and Medicaid Services includes, in addition to the face-to-face time of a patient visit (documented in the note above) non-face-to-face time: obtaining and reviewing outside history, ordering and reviewing medications, tests or procedures, care coordination (communications with other health care professionals or caregivers) and documentation in the medical record.

## 2021-05-16 ENCOUNTER — Other Ambulatory Visit: Payer: Self-pay | Admitting: *Deleted

## 2021-05-16 ENCOUNTER — Other Ambulatory Visit: Payer: Self-pay

## 2021-05-16 ENCOUNTER — Ambulatory Visit
Admission: RE | Admit: 2021-05-16 | Discharge: 2021-05-16 | Disposition: A | Discharge status: Home / Self Care | Payer: BC Managed Care – PPO | Source: Ambulatory Visit | Attending: Oncology | Admitting: Oncology

## 2021-05-16 ENCOUNTER — Inpatient Hospital Stay: Payer: BC Managed Care – PPO | Attending: Oncology | Admitting: Oncology

## 2021-05-16 ENCOUNTER — Inpatient Hospital Stay: Payer: BC Managed Care – PPO

## 2021-05-16 VITALS — BP 135/75 | HR 70 | Temp 97.7°F | Resp 18 | Ht 65.0 in | Wt 174.7 lb

## 2021-05-16 DIAGNOSIS — Z170 Estrogen receptor positive status [ER+]: Secondary | ICD-10-CM | POA: Insufficient documentation

## 2021-05-16 DIAGNOSIS — C50212 Malignant neoplasm of upper-inner quadrant of left female breast: Secondary | ICD-10-CM

## 2021-05-16 DIAGNOSIS — C50411 Malignant neoplasm of upper-outer quadrant of right female breast: Secondary | ICD-10-CM

## 2021-05-16 DIAGNOSIS — Z79811 Long term (current) use of aromatase inhibitors: Secondary | ICD-10-CM | POA: Insufficient documentation

## 2021-05-16 LAB — CBC WITH DIFFERENTIAL (CANCER CENTER ONLY)
Abs Immature Granulocytes: 0.02 10*3/uL (ref 0.00–0.07)
Basophils Absolute: 0 10*3/uL (ref 0.0–0.1)
Basophils Relative: 1 %
Eosinophils Absolute: 0.1 10*3/uL (ref 0.0–0.5)
Eosinophils Relative: 2 %
HCT: 40.6 % (ref 36.0–46.0)
Hemoglobin: 14 g/dL (ref 12.0–15.0)
Immature Granulocytes: 0 %
Lymphocytes Relative: 22 %
Lymphs Abs: 1.3 10*3/uL (ref 0.7–4.0)
MCH: 29.4 pg (ref 26.0–34.0)
MCHC: 34.5 g/dL (ref 30.0–36.0)
MCV: 85.1 fL (ref 80.0–100.0)
Monocytes Absolute: 0.6 10*3/uL (ref 0.1–1.0)
Monocytes Relative: 9 %
Neutro Abs: 4 10*3/uL (ref 1.7–7.7)
Neutrophils Relative %: 66 %
Platelet Count: 251 10*3/uL (ref 150–400)
RBC: 4.77 MIL/uL (ref 3.87–5.11)
RDW: 13 % (ref 11.5–15.5)
WBC Count: 6.1 10*3/uL (ref 4.0–10.5)
nRBC: 0 % (ref 0.0–0.2)

## 2021-05-16 LAB — CMP (CANCER CENTER ONLY)
ALT: 20 U/L (ref 0–44)
AST: 19 U/L (ref 15–41)
Albumin: 4.2 g/dL (ref 3.5–5.0)
Alkaline Phosphatase: 65 U/L (ref 38–126)
Anion gap: 12 (ref 5–15)
BUN: 11 mg/dL (ref 8–23)
CO2: 22 mmol/L (ref 22–32)
Calcium: 9.9 mg/dL (ref 8.9–10.3)
Chloride: 107 mmol/L (ref 98–111)
Creatinine: 0.81 mg/dL (ref 0.44–1.00)
GFR, Estimated: >60 mL/min (ref >60)
Glucose, Bld: 158 mg/dL — ABNORMAL HIGH (ref 70–99)
Potassium: 4.3 mmol/L (ref 3.5–5.1)
Sodium: 141 mmol/L (ref 135–145)
Total Bilirubin: 1 mg/dL (ref 0.3–1.2)
Total Protein: 7.6 g/dL (ref 6.5–8.1)

## 2021-05-16 MED ORDER — ANASTROZOLE 1 MG PO TABS: 1.0000 mg | ORAL_TABLET | Freq: Every day | ORAL | 4 refills | Status: DC

## 2021-05-17 ENCOUNTER — Telehealth: Payer: Self-pay | Admitting: Oncology

## 2021-05-17 NOTE — Telephone Encounter (Signed)
Patient said she would call to schedule appointment, did not want to scheduled during check out.

## 2021-09-15 ENCOUNTER — Encounter (HOSPITAL_COMMUNITY): Payer: Self-pay

## 2022-04-07 ENCOUNTER — Other Ambulatory Visit: Payer: Self-pay | Admitting: Family Medicine

## 2022-04-07 DIAGNOSIS — Z09 Encounter for follow-up examination after completed treatment for conditions other than malignant neoplasm: Secondary | ICD-10-CM

## 2022-05-18 ENCOUNTER — Other Ambulatory Visit: Payer: Self-pay | Admitting: Hematology and Oncology

## 2022-05-18 ENCOUNTER — Telehealth: Payer: Self-pay | Admitting: Hematology and Oncology

## 2022-05-18 DIAGNOSIS — Z09 Encounter for follow-up examination after completed treatment for conditions other than malignant neoplasm: Secondary | ICD-10-CM

## 2022-05-18 NOTE — Telephone Encounter (Signed)
Contacted patient to scheduled appointments. Patient is aware of appointments that are scheduled.  Please advised..  patient did not want to schedule follow up appointment when she seen Mag[place] in 2022.Marland Kitchen Patient also stated that she did not want to schedule an appointment at all and did not need yearly followups. I advised patient that an oncologist needed  to sign for her mammogram, so she would need to establish care with someone. Patient understood and will come to follow up.

## 2022-05-19 ENCOUNTER — Ambulatory Visit
Admission: RE | Admit: 2022-05-19 | Discharge: 2022-05-19 | Disposition: A | Discharge status: Home / Self Care | Payer: BC Managed Care – PPO | Source: Ambulatory Visit | Attending: Family Medicine | Admitting: Family Medicine

## 2022-05-19 DIAGNOSIS — Z09 Encounter for follow-up examination after completed treatment for conditions other than malignant neoplasm: Secondary | ICD-10-CM

## 2022-05-22 ENCOUNTER — Other Ambulatory Visit: Payer: Self-pay | Admitting: *Deleted

## 2022-05-22 DIAGNOSIS — C50212 Malignant neoplasm of upper-inner quadrant of left female breast: Secondary | ICD-10-CM

## 2022-05-23 ENCOUNTER — Encounter: Payer: Self-pay | Admitting: Hematology and Oncology

## 2022-05-23 ENCOUNTER — Inpatient Hospital Stay (HOSPITAL_BASED_OUTPATIENT_CLINIC_OR_DEPARTMENT_OTHER): Payer: BC Managed Care – PPO | Admitting: Hematology and Oncology

## 2022-05-23 ENCOUNTER — Inpatient Hospital Stay: Payer: BC Managed Care – PPO | Attending: Hematology and Oncology

## 2022-05-23 ENCOUNTER — Other Ambulatory Visit: Payer: Self-pay

## 2022-05-23 VITALS — BP 134/73 | HR 71 | Temp 98.4°F | Resp 16 | Ht 65.0 in | Wt 166.4 lb

## 2022-05-23 DIAGNOSIS — Z79810 Long term (current) use of selective estrogen receptor modulators (SERMs): Secondary | ICD-10-CM | POA: Insufficient documentation

## 2022-05-23 DIAGNOSIS — Z170 Estrogen receptor positive status [ER+]: Secondary | ICD-10-CM | POA: Diagnosis not present

## 2022-05-23 DIAGNOSIS — C50212 Malignant neoplasm of upper-inner quadrant of left female breast: Secondary | ICD-10-CM | POA: Diagnosis present

## 2022-05-23 MED ORDER — TAMOXIFEN CITRATE 20 MG PO TABS: 20.0000 mg | ORAL_TABLET | Freq: Every day | ORAL | 3 refills | Status: DC

## 2022-05-23 NOTE — Progress Notes (Signed)
[facility]  [phone] [phone]     ID: Christine Mosley DOB: 08/17/1956  MR#: [id number]  JQB#:[id number]  Patient Care Team: [name], PA-C as PCP - General (Family Medicine) Mauro Kaufmann, RN as Oncology Nurse Navigator Rockwell Germany, RN as Oncology Nurse Navigator Rolm Bookbinder, MD as Consulting Physician (General Surgery) Magrinat, Virgie Dad, MD (Inactive) as Consulting Physician (Oncology) Eppie Gibson, MD as Attending Physician (Radiation Oncology) Maisie Fus, MD (Inactive) as Consulting Physician (Obstetrics and Gynecology) Sydnee Cabal, MD as Consulting Physician (Orthopedic Surgery) Benay Pike, MD OTHER MD:  CHIEF COMPLAINT: Estrogen receptor positive invasive breast cancer  CURRENT TREATMENT: anastrozole   INTERVAL HISTORY:  Christine Mosley returns today for follow up of her estrogen receptor positive breast cancer.  She arrived today to the appointment with her sister.  She tells me that after her last visit with Dr. Jana Hakim, she went to the pharmacy to pick her anastrozole and the pharmacist told her that it is used for depression.  She also tells me that the surgeon tells me that the anastrozole was used for depression.  She stopped taking it several months ago because she is mentions that the medication makes her bones ache so bad that she needs help getting up.  She felt like there was no quality of life hence she discontinued it. She had a mammogram since her last visit which was unremarkable.  She repeatedly tried to tell me the same thing that anastrozole was and antidepression medication and she does not want to take any antidepressants. She otherwise denies any pertinent complaints.  Rest of the pertinent 10 point ROS reviewed and negative    COVID 19 VACCINATION STATUS: Pfizer x2 (second dose 05/05/2020) as of September 2022   HISTORY OF CURRENT ILLNESS: From prior intake note:  Christine Mosley has a  history of stage pT2, pN0 right breast cancer, status post right lumpectomy and sentinel lymph node biopsy on 12/08/2003 showing: invasive ductal carcinoma, grade 2, 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 Estrogen and progesterone receptor both positive, and Her2 negative by FISH. Both axillary lymph nodes were negative for metastasis (0/2). She was treated with chemotherapy, radiation therapy, and tamoxifen. I last saw here in 06/2010 when she "graduated" from follow up.  More recently, she had routine screening mammography on 03/22/2020 showing a possible abnormality in the left breast. She underwent left diagnostic mammography with tomography and left breast ultrasonography at [facility] on 04/05/2020 showing: breast density category B; 7 mm mass at 11:30 in left breast; normal-appearing left axillary lymph nodes.  Accordingly on 04/06/2020 she proceeded to biopsy of the left breast area in question. The pathology from this procedure ([id number]) showed: invasive and in situ mammary carcinoma, e-cadherin positive, grade 2; calcifications. Prognostic indicators significant for: estrogen receptor, 95% positive and progesterone receptor, 2% positive, both with moderate staining intensity. Proliferation marker Ki67 at 5%. HER2 equivocal by immunohistochemistry (2+), but negative by fluorescent in situ hybridization with a signals ratio 1.23 and number per cell 1.9.  [name] subsequent history is as detailed below.   PAST MEDICAL HISTORY: Past Medical History:  Diagnosis Date   Breast cancer ([place]) 2005   right breast-lumpectomy, chemo. radiation   Breast cancer [facility]) 2021   left breast IDC with DCIS   Family history of lung cancer    Family history of prostate cancer    Family history of skin cancer    Personal history of  chemotherapy    Personal history of radiation therapy     PAST SURGICAL HISTORY: Past Surgical History:  Procedure Laterality Date    ABDOMINAL HYSTERECTOMY     BREAST BIOPSY Left 04/06/2020   BREAST LUMPECTOMY Right 2005   BREAST LUMPECTOMY Left 04/27/2020   BREAST LUMPECTOMY WITH RADIOACTIVE SEED AND SENTINEL LYMPH NODE BIOPSY Left 04/27/2020   Procedure: LEFT BREAST LUMPECTOMY WITH RADIOACTIVE SEED AND LEFT AXILLARY SENTINEL LYMPH NODE BIOPSY;  Surgeon: Rolm Bookbinder, MD;  Location: [facility];  Service: General;  Laterality: Left;   SHOULDER SURGERY      FAMILY HISTORY: Family History  Problem Relation Age of Onset   Congestive Heart Failure Mother    Prostate cancer Father 66       metastatic   Lung cancer Sister 76   Liver cancer Maternal Aunt 58   Skin cancer Sister        multiple   Cancer Maternal Aunt        unknown type, dx. in her 63s   Lung cancer Cousin        paternal first cousin   Cancer Cousin        mouth cancer; paternal first cousin   Cancer Cousin        nose cancer; paternal first cousin   Her father died at age 65 from prostate cancer, which was diagnosed at age 61. Her mother died at age 53 from CHF. Christine Mosley has 2 sisters (and 0 brothers). She reports lung cancer in her sister, diagnosed at age 92; she succumbed to this disease.   GYNECOLOGIC HISTORY:  No LMP recorded. Patient has had a hysterectomy. Menarche: age unsure Age at first live birth: 66 years old [place] P 2 LMP 2005 Contraceptive: never used HRT never used  Hysterectomy? Yes, 01/2008, for uterine prolapse BSO? Yes (see report [place] is [phone]).   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 Husband Arvin "Christine Mosley" is a dye cutter. She lives at home with husband Christine Mosley and son Christine Mosley, who is 41. Son Christine Mosley, age 34, is a Charity fundraiser in [place]. Christine Mosley has one granddaughter, age 2. She attends a [employer] church.    ADVANCED DIRECTIVES: In the absence of any documentation to the contrary, the patient's spouse is their HCPOA.    HEALTH MAINTENANCE: Social History   Tobacco  Use   Smoking status: Never   Smokeless tobacco: Never  Vaping Use   Vaping Use: Never used  Substance Use Topics   Alcohol use: Never   Drug use: Never     Colonoscopy: never done, Cologuard in 2020  PAP: 03/2020, negative  Bone density: yes, date unknown   Allergies  Allergen Reactions   Codeine Nausea And Vomiting    Current Outpatient Medications  Medication Sig Dispense Refill   acetaminophen (TYLENOL) 325 MG tablet Take 650 mg by mouth every 6 (six) hours as needed.     anastrozole (ARIMIDEX) 1 MG tablet Take 1 tablet (1 mg total) by mouth daily. 90 tablet 4   cholecalciferol (VITAMIN D3) 25 MCG (1000 UNIT) tablet Take 1 tablet (1,000 Units total) by mouth daily. 90 tablet 4   lidocaine (LIDODERM) 5 % Place 1 patch onto the skin daily. Remove & Discard patch within 12 hours or as directed by MD 30 patch 2   No current facility-administered medications for this visit.    OBJECTIVE: White woman who appears stated  There were no vitals filed for this visit.  There is no height or weight on file to calculate BMI.   Wt Readings from Last 3 Encounters:  05/16/21 174 lb 11.2 oz (79.2 kg)  10/18/20 176 lb 12.8 oz (80.2 kg)  07/26/20 177 lb 1.6 oz (80.3 kg)      ECOG FS:1 - Symptomatic but completely ambulatory  Physical Exam Constitutional:      Appearance: Normal appearance.  Chest:     Comments: Bilateral breasts inspected.  Right breast smaller than the left breast.  Left breast with postradiation changes.  No palpable masses in either breast.  No regional adenopathy. Musculoskeletal:     Cervical back: Normal range of motion and neck supple. No rigidity.  Lymphadenopathy:     Cervical: No cervical adenopathy.  Neurological:     Mental Status: She is alert.       LAB RESULTS:  CMP     Component Value Date/Time   NA 141 05/16/2021 0923   K 4.3 05/16/2021 0923   CL 107 05/16/2021 0923   CO2 22 05/16/2021 0923   GLUCOSE 158 (H) 05/16/2021 0923   BUN  11 05/16/2021 0923   CREATININE 0.81 05/16/2021 0923   CALCIUM 9.9 05/16/2021 0923   PROT 7.6 05/16/2021 0923   ALBUMIN 4.2 05/16/2021 0923   AST 19 05/16/2021 0923   ALT 20 05/16/2021 0923   ALKPHOS 65 05/16/2021 0923   BILITOT 1.0 05/16/2021 0923   GFRNONAA >60 05/16/2021 0923   GFRAA >60 04/14/2020 0824    No results found for: "TOTALPROTELP", "ALBUMINELP", "A1GS", "A2GS", "BETS", "BETA2SER", "GAMS", "MSPIKE", "SPEI"  Lab Results  Component Value Date   WBC 6.1 05/16/2021   NEUTROABS 4.0 05/16/2021   HGB 14.0 05/16/2021   HCT 40.6 05/16/2021   MCV 85.1 05/16/2021   PLT 251 05/16/2021    Lab Results  Component Value Date   LABCA2 17 07/04/2010    No components found for: "WUJWJX914"  No results for input(s): "INR" in the last 168 hours.  Lab Results  Component Value Date   LABCA2 17 07/04/2010    No results found for: "CAN199"  No results found for: "CAN125"  No results found for: "CAN153"  No results found for: "CA2729"  No components found for: "HGQUANT"  No results found for: "CEA1", "CEA" / No results found for: "CEA1", "CEA"   No results found for: "AFPTUMOR"  No results found for: "CHROMOGRNA"  No results found for: "KPAFRELGTCHN", "LAMBDASER", "KAPLAMBRATIO" (kappa/lambda light chains)  No results found for: "HGBA", "HGBA2QUANT", "HGBFQUANT", "HGBSQUAN" (Hemoglobinopathy evaluation)   Lab Results  Component Value Date   LDH 148 10/09/2006    No results found for: "IRON", "TIBC", "IRONPCTSAT" (Iron and TIBC)  No results found for: "FERRITIN"  Urinalysis    Component Value Date/Time   COLORURINE YELLOW 01/31/2008 [place] 01/31/2008 1217   LABSPEC 1.015 01/31/2008 1217   PHURINE 6.0 01/31/2008 1217   GLUCOSEU NEGATIVE 01/31/2008 1217   HGBUR NEGATIVE 01/31/2008 1217   BILIRUBINUR NEGATIVE 01/31/2008 1217   KETONESUR NEGATIVE 01/31/2008 1217   PROTEINUR NEGATIVE 01/31/2008 1217   UROBILINOGEN 0.2 01/31/2008 1217    NITRITE NEGATIVE 01/31/2008 1217   LEUKOCYTESUR  01/31/2008 1217    NEGATIVE MICROSCOPIC NOT DONE ON URINES WITH NEGATIVE PROTEIN, BLOOD, LEUKOCYTES, NITRITE, OR GLUCOSE <1000 mg/dL.    STUDIES: MM DIAG BREAST TOMO BILATERAL  Result Date: 05/19/2022 CLINICAL DATA:  66 year old who underwent malignant lumpectomy of the LEFT breast in September, 2021. She has a personal history of malignant  lumpectomy of the RIGHT breast in 2005. Annual evaluation. EXAM: DIGITAL DIAGNOSTIC BILATERAL MAMMOGRAM WITH TOMOSYNTHESIS TECHNIQUE: Bilateral digital diagnostic mammography and breast tomosynthesis was performed. COMPARISON:  Previous exam(s). ACR Breast Density Category b: There are scattered areas of fibroglandular density. FINDINGS: Full field CC and MLO views of both breasts and a spot magnification CC view of the lumpectomy site in the LEFT breast were obtained. RIGHT: No findings suspicious for malignancy. Stable scar/distortion at the lumpectomy site in the upper breast at posterior depth. LEFT: No findings suspicious for malignancy. Scar/distortion at the lumpectomy site in the upper breast at middle to posterior depth. Residual minimal post radiation skin thickening and trabecular thickening, improved since the mammogram 1 year ago. IMPRESSION: 1. No mammographic evidence of malignancy involving breast. 2. Expected post lumpectomy changes and residual minimal post radiation changes involving LEFT breast. 3. Stable post lumpectomy changes involving the RIGHT breast. RECOMMENDATION: Per protocol, as the patient is now 2 or more years status post lumpectomy, she may return to annual screening mammography in 1 year. However, given the history of breast cancer, the patient remains eligible for annual diagnostic mammography if preferred. I have discussed the findings and recommendations with the patient. If applicable, a reminder letter will be sent to the patient regarding the next appointment. BI-RADS CATEGORY  2:  Benign. Electronically Signed   By: Evangeline Dakin M.D.   On: 05/19/2022 11:57    ELIGIBLE FOR AVAILABLE RESEARCH PROTOCOL: AET  ASSESSMENT: 66 y.o. [place] woman  (1) status post right breast upper outer quadrant lumpectomy 04 19 2005 for a pT2 pN0, stage IB invasive breast cancer, estrogen and progesterone receptor positive, HER-2 not amplified  (a) a total of 2 right sentinel lymph nodes were removed  (b) status post adjuvant chemotherapy  (c) status post adjuvant radiation  (d) status post tamoxifen x5+ years  (2) status post left breast upper inner quadrant biopsy 04/06/2020 for a clinical T1b N0, stage IA invasive ductal carcinoma, grade 2, E-cadherin positive, estrogen and progesterone receptor positive, HER-2 not amplified, with an MIB-1 of 5%  (3) status post left lumpectomy and sentinel lymph node sampling 04/27/2020 for a pT1b pN1, stage IB invasive ductal carcinoma, grade 1, with negative margins  (a) a total of 1 axillary lymph node was removed  (4) MammaPrint obtained from the definitive surgical sample showed a luminal a tumor, low risk, with no significant benefit anticipated from chemotherapy  (5) adjuvant radiation 06/03/2020 through 07/14/2020.  (6) genetics testing 04/23/2020 through the Common Hereditary Cancers Panel offered by [facility] found no deleterious mutations in APC, ATM, AXIN2, BARD1, BMPR1A, BRCA1, BRCA2, BRIP1, CDH1, CDK4, CDKN2A (p14ARF), CDKN2A (p16INK4a), CHEK2, CTNNA1, DICER1, EPCAM (Deletion/duplication testing only), GREM1 (promoter region deletion/duplication testing only), KIT, MEN1, MLH1, MSH2, MSH3, MSH6, MUTYH, NBN, NF1, NTHL1, PALB2, PDGFRA, PMS2, POLD1, POLE, PTEN, RAD50, RAD51C, RAD51D, RNF43, SDHB, SDHC, SDHD, SMAD4, SMARCA4. STK11, TP53, TSC1, TSC2, and VHL.  The following genes were evaluated for sequence changes only: SDHA and HOXB13 c.251G>A variant only.    (a) . 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 T  (7) anastrozole started 07/26/2020  (a) bone density 07/30/2020 shows a T score of -1.1 (nearly normal).    PLAN:  She is here for follow-up on adjuvant anastrozole but she has actually stopped taking this several months ago.  She tells me that people have told her that this is an antidepressant and she did not want to take  it, also the medication caused her severe arthralgias and she did not have a good quality of life.  She felt very foggy on it.  I have clearly explained to her that anastrozole is not an antidepressant it is an antiestrogen therapy and is used for treatment of breast cancer.  I have tried to tell her that no matter who said to her otherwise, I can assure her that this is the indication for anastrozole.  She however was not willing to try it again.  She in the past has tolerated tamoxifen very well.  Hence have recommended that she get back on tamoxifen.  I once again discussed about adverse effects of tamoxifen, symptoms and signs of DVT/PE.  She had hysterectomy hence no impact on endometrium.  I have asked her to call us before she discontinue tamoxifen if she decides to.  Tamoxifen has been dispensed to the pharmacy of her choice.  Most recent mammogram has been reviewed and unremarkable.  She can return to clinic in 1 year. She will be due for bone density in December 2023, last bone density was nearly normal and she is now going to be on tamoxifen hence we do not have to routinely monitor bone density in the oncology office. Total time spent: 40 minutes  *Total Encounter Time as defined by the Centers for Medicare and Medicaid Services includes, in addition to the face-to-face time of a patient visit (documented in the note above) non-face-to-face time: obtaining and reviewing outside history, ordering and reviewing medications, tests or procedures, care coordination (communications with other health care professionals or caregivers) and documentation in the  medical record.

## 2022-06-09 LAB — EXTERNAL GENERIC LAB PROCEDURE: COLOGUARD: NEGATIVE

## 2022-06-09 LAB — COLOGUARD: COLOGUARD: NEGATIVE

## 2022-08-11 IMAGING — MG MM DIGITAL DIAGNOSTIC UNILAT*L* W/ TOMO W/ CAD
4 series · 4 of 12 positions shown · non-contrast
Comparison: Previous exam(s).
COMPARISON: Previous exam(s).

Addendum:
CLINICAL DATA: Possible mass in the posterior 12 o'clock position
of the left breast on a recent screening mammogram.

EXAM:
DIGITAL DIAGNOSTIC LEFT MAMMOGRAM WITH TOMO
ULTRASOUND LEFT BREAST

[L MLO synth-2D]
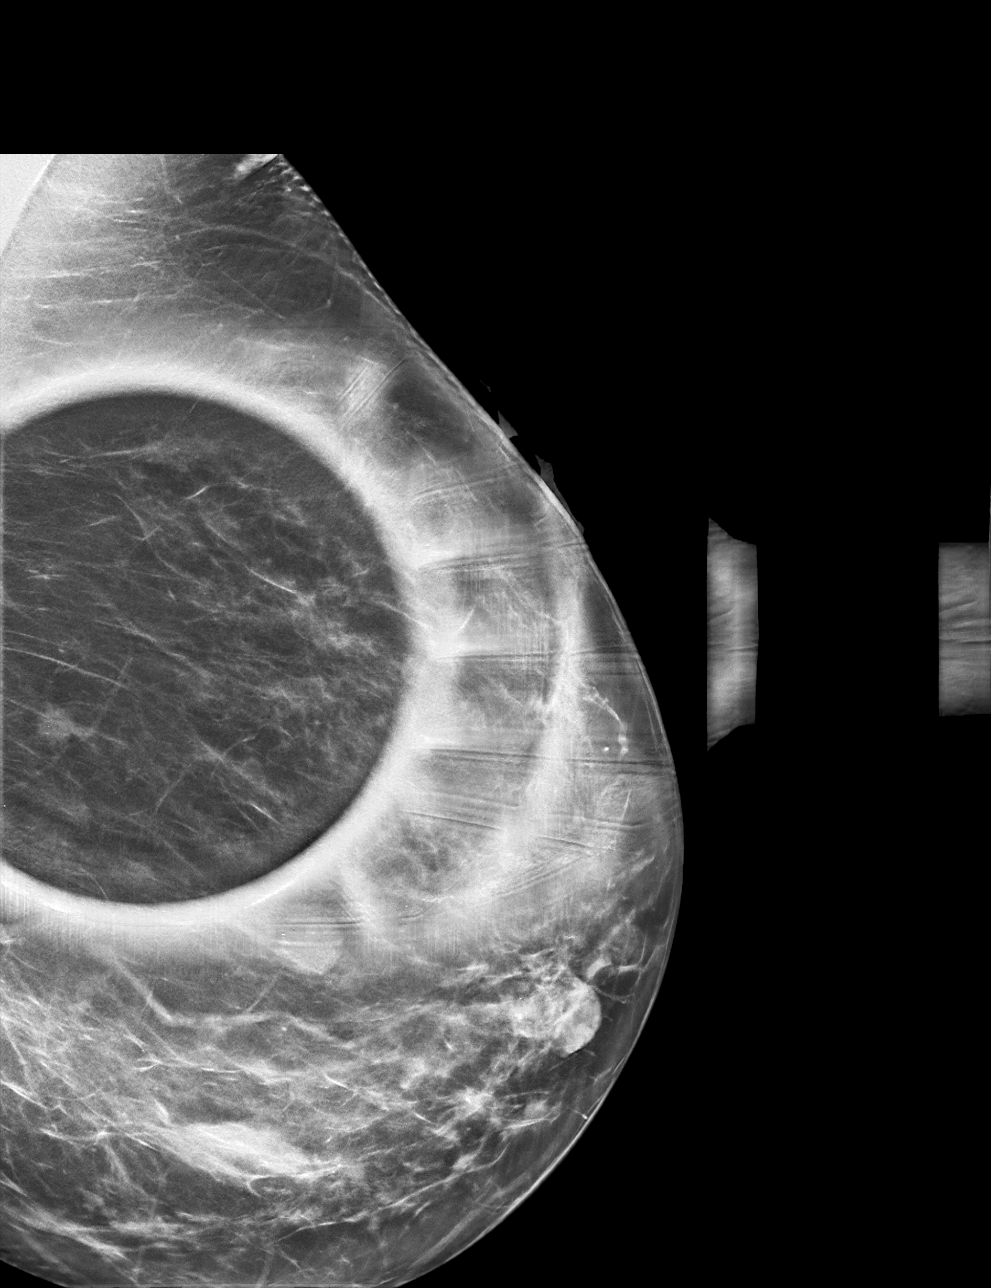

[L CC synth-2D]
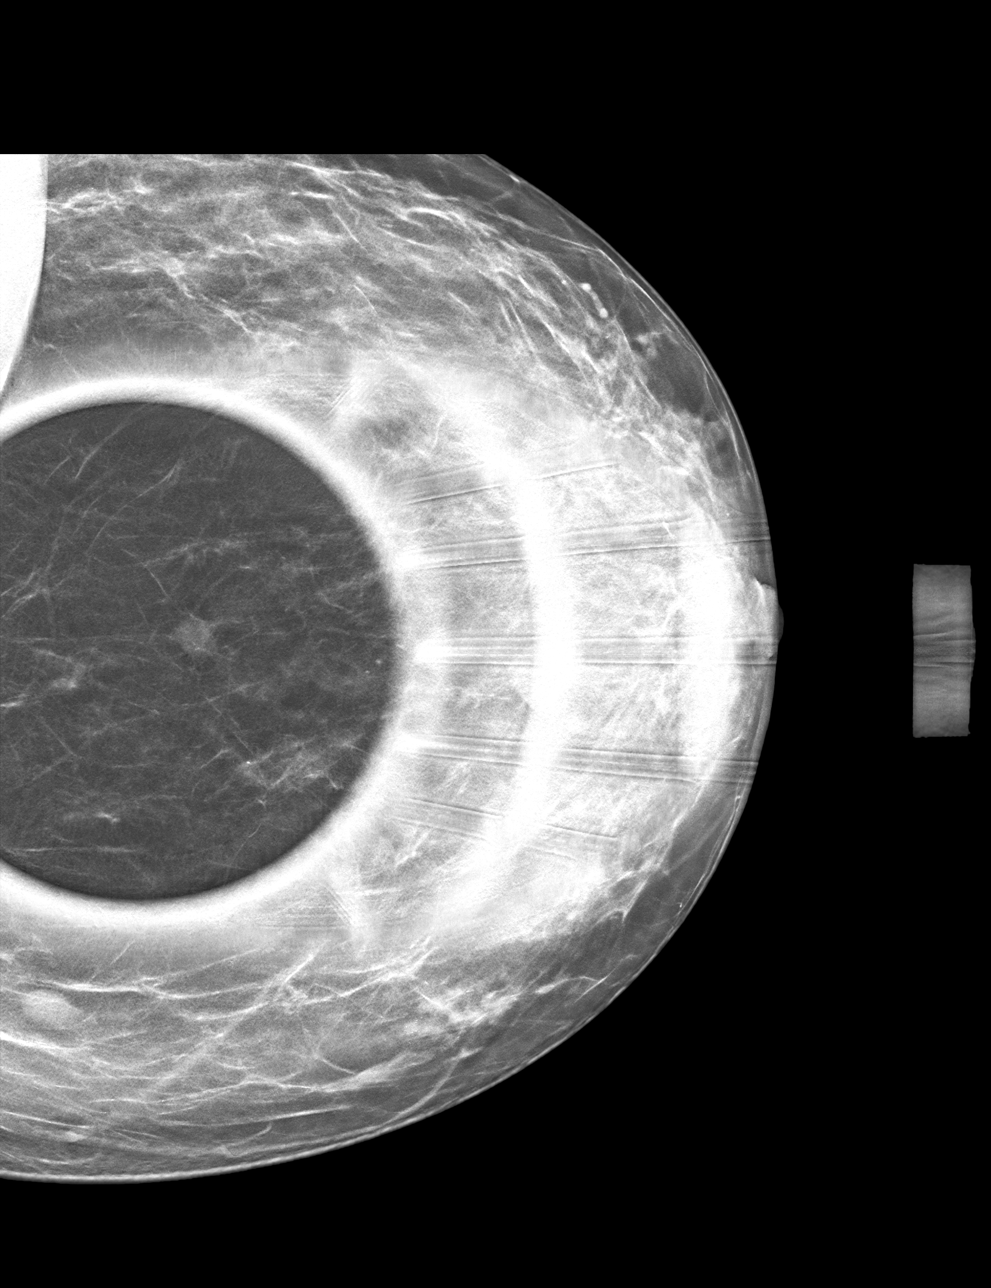

[L CC tomo · tomo slice 26/51.0]
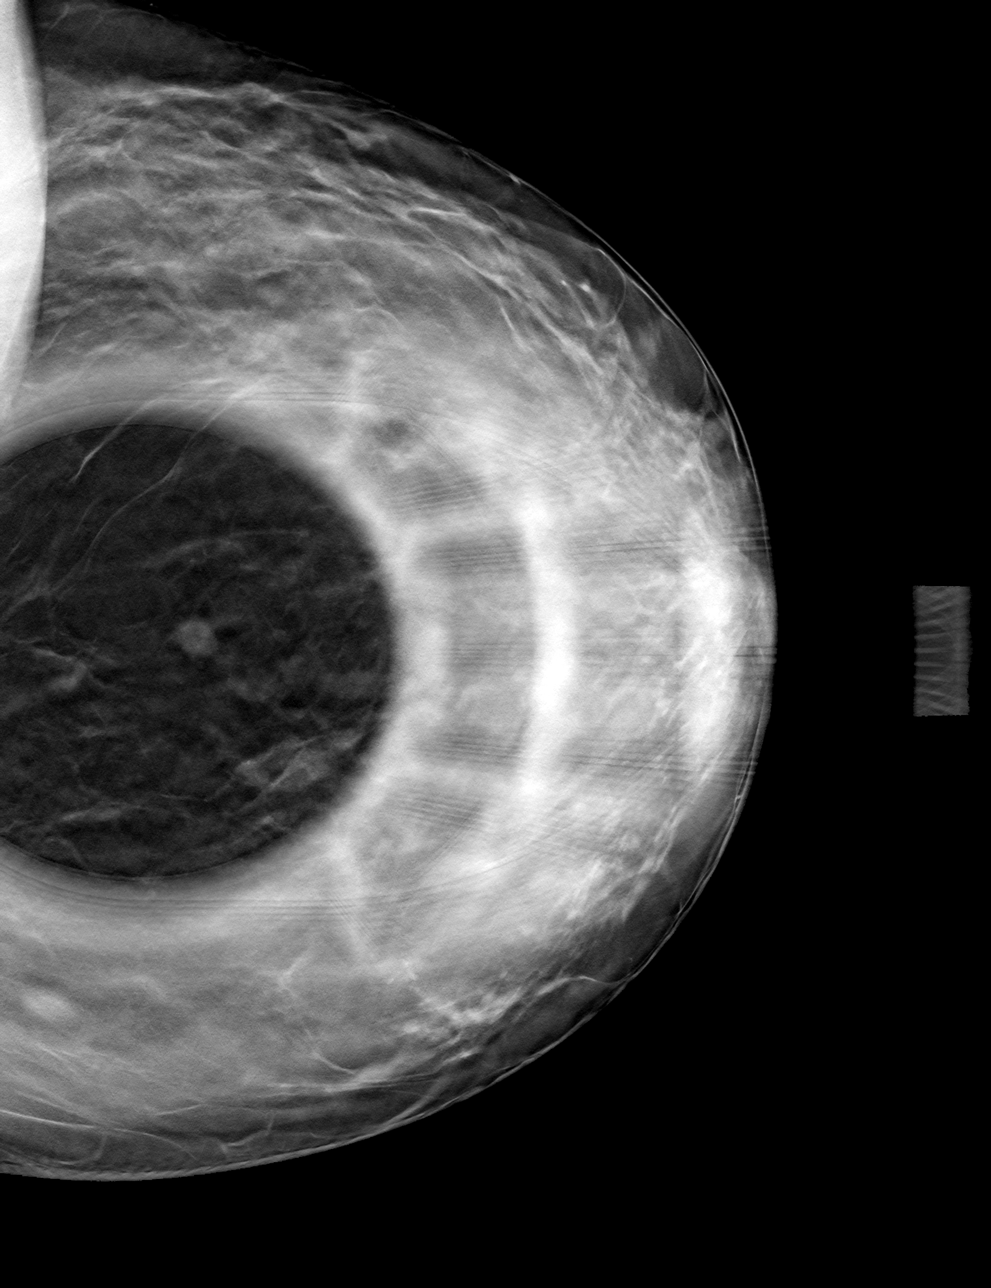

[L MLO tomo · tomo slice 33/64.0]
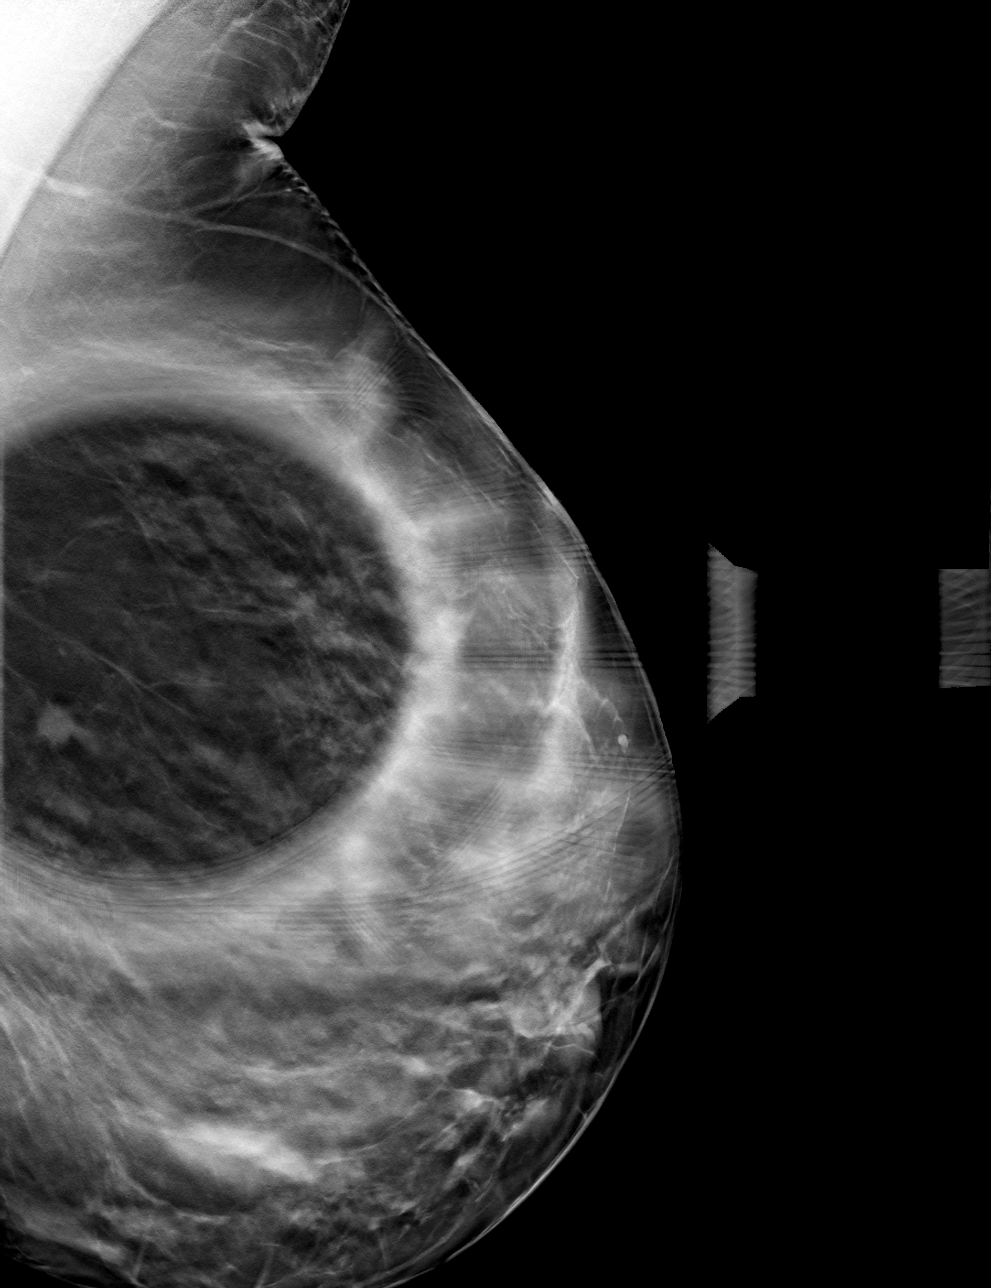

[4 of 12 positions shown; findings below may reference images not displayed]

ACR Breast Density Category b: There are scattered areas of
fibroglandular density.
FINDINGS: 3D tomographic and 2D generated spot compression views of the left
breast demonstrate a small, rounded mass with irregular margins and
mild spiculation in the posterior 12 o'clock position of the breast.
This contains tiny calcifications, some arranged in a linear
fashion.

Targeted ultrasound is performed, showing a 7 x 6 x 6 mm oval,
mildly irregular mass with indistinct margins in the posterior left
breast in the 11:30 o'clock position, 6 cm from the nipple. This
corresponds to the mammographic mass.

Ultrasound of the left axilla demonstrates appearing left axillary
lymph nodes.
IMPRESSION: 7 mm mass with imaging features highly suspicious for malignancy in
the 11:30 o'clock position of the left breast.

RECOMMENDATION:
Ultrasound-guided core needle biopsy of the 7 mm mass in the 11:30
o'clock position of the left breast. This has been discussed with
the patient and scheduled at [DATE] p.m. on 04/07/1999.

I have discussed the findings and recommendations with the patient.
If applicable, a reminder letter will be sent to the patient
regarding the next appointment.

BI-RADS CATEGORY  5: Highly suggestive of malignancy.

ADDENDUM:
The last sentence of the findings section of the report should read
as follows: Ultrasound of the left axilla demonstrates normal
appearing left axillary lymph nodes.

*** End of Addendum ***
ACR Breast Density Category b: There are scattered areas of
fibroglandular density.
FINDINGS: 3D tomographic and 2D generated spot compression views of the left
breast demonstrate a small, rounded mass with irregular margins and
mild spiculation in the posterior 12 o'clock position of the breast.
This contains tiny calcifications, some arranged in a linear
fashion.

Targeted ultrasound is performed, showing a 7 x 6 x 6 mm oval,
mildly irregular mass with indistinct margins in the posterior left
breast in the 11:30 o'clock position, 6 cm from the nipple. This
corresponds to the mammographic mass.

Ultrasound of the left axilla demonstrates appearing left axillary
lymph nodes.
IMPRESSION: 7 mm mass with imaging features highly suspicious for malignancy in
the 11:30 o'clock position of the left breast.

RECOMMENDATION:
Ultrasound-guided core needle biopsy of the 7 mm mass in the 11:30
o'clock position of the left breast. This has been discussed with
the patient and scheduled at [DATE] p.m. on 04/07/1999.

I have discussed the findings and recommendations with the patient.
If applicable, a reminder letter will be sent to the patient
regarding the next appointment.

BI-RADS CATEGORY  5: Highly suggestive of malignancy.

## 2022-08-12 IMAGING — MG MM BREAST LOCALIZATION CLIP
4 series · 4 of 12 positions shown · non-contrast
Comparison: Previous exam(s).

CLINICAL DATA: Evaluate RIBBON biopsy marking clip position
following ultrasound-guided LEFT breast biopsy.

EXAM:
DIAGNOSTIC LEFT MAMMOGRAM POST ULTRASOUND BIOPSY

[L ML synth-2D]
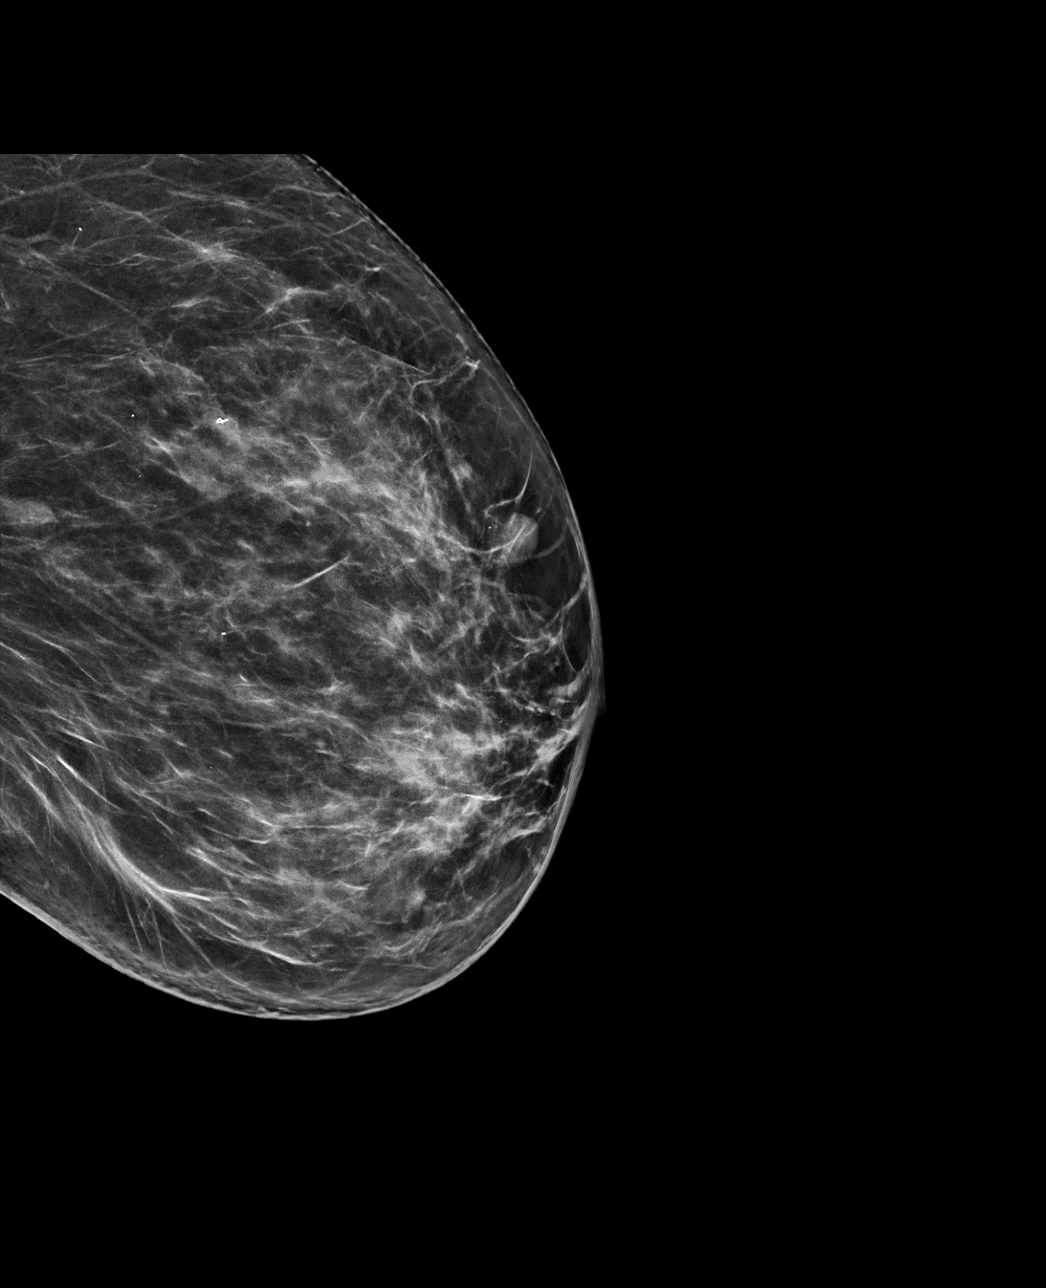

[L CC synth-2D]
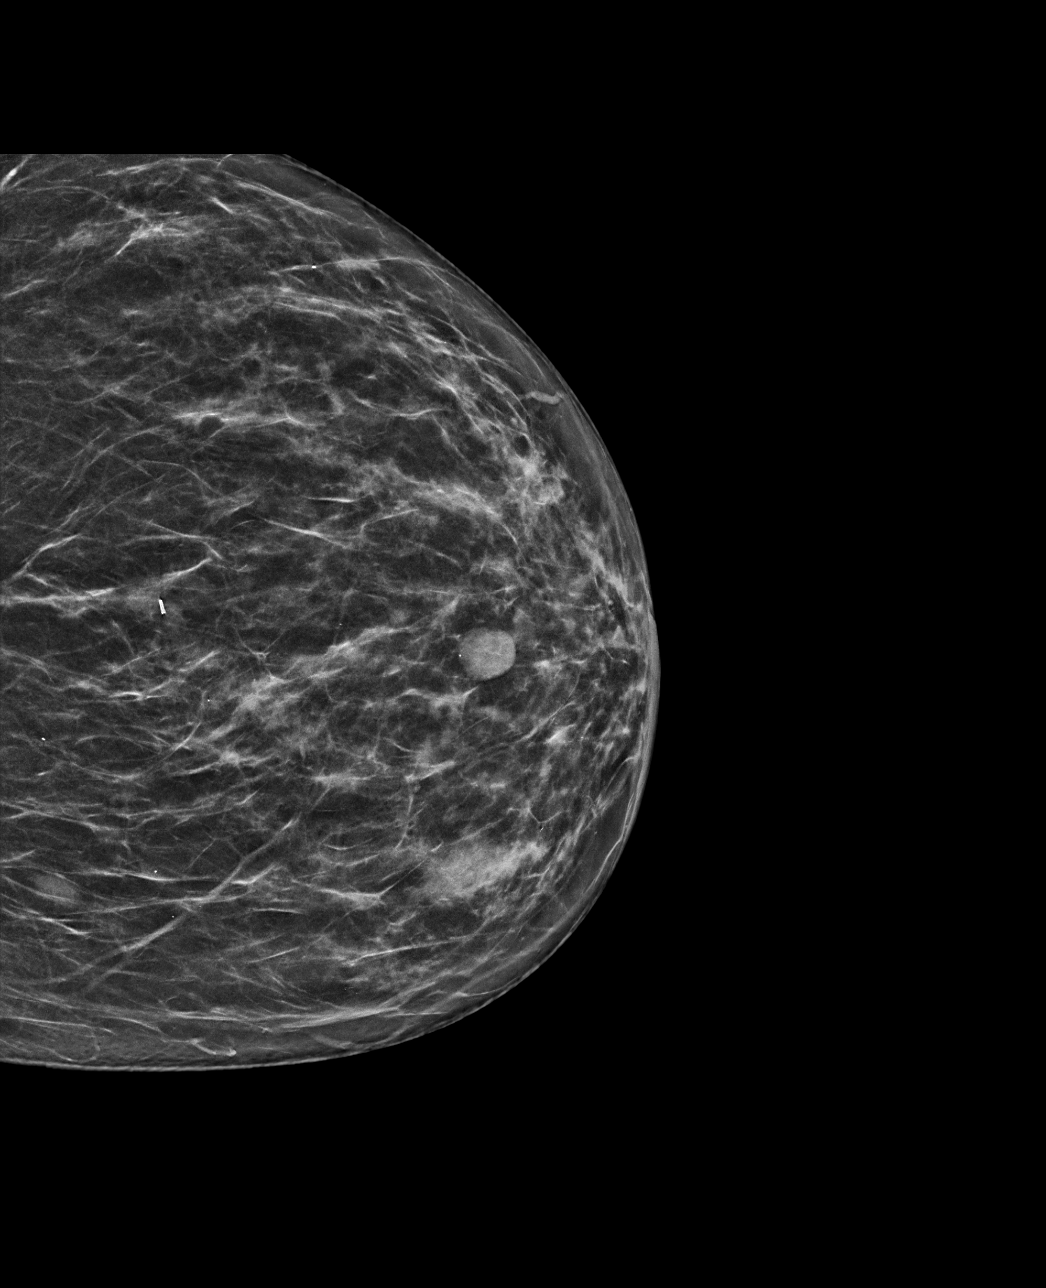

[L CC tomo · tomo slice 31/62.0]
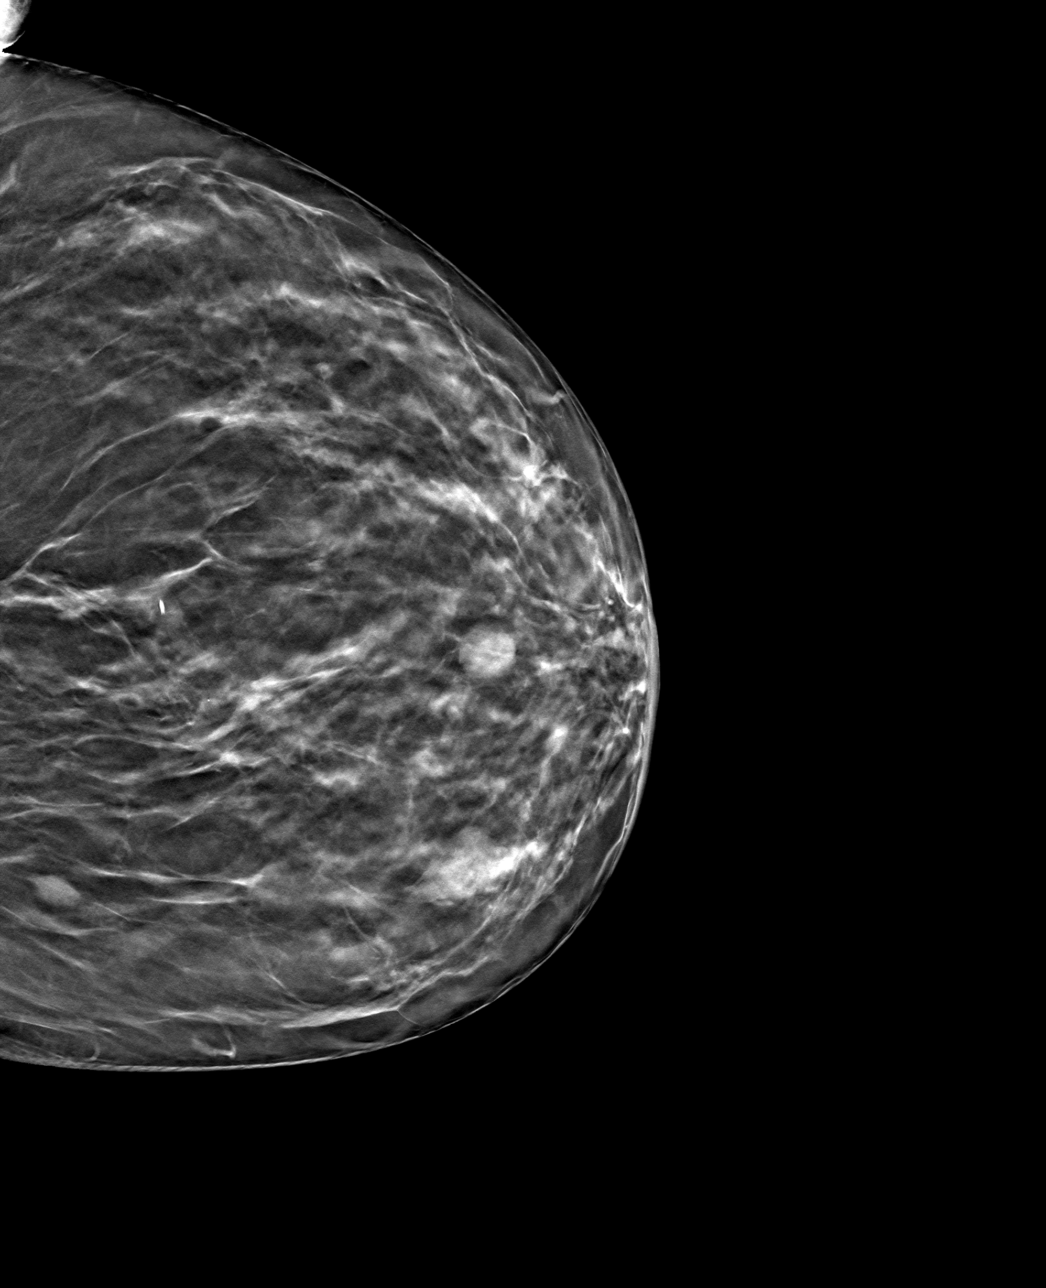

[L ML tomo · tomo slice 37/74.0]
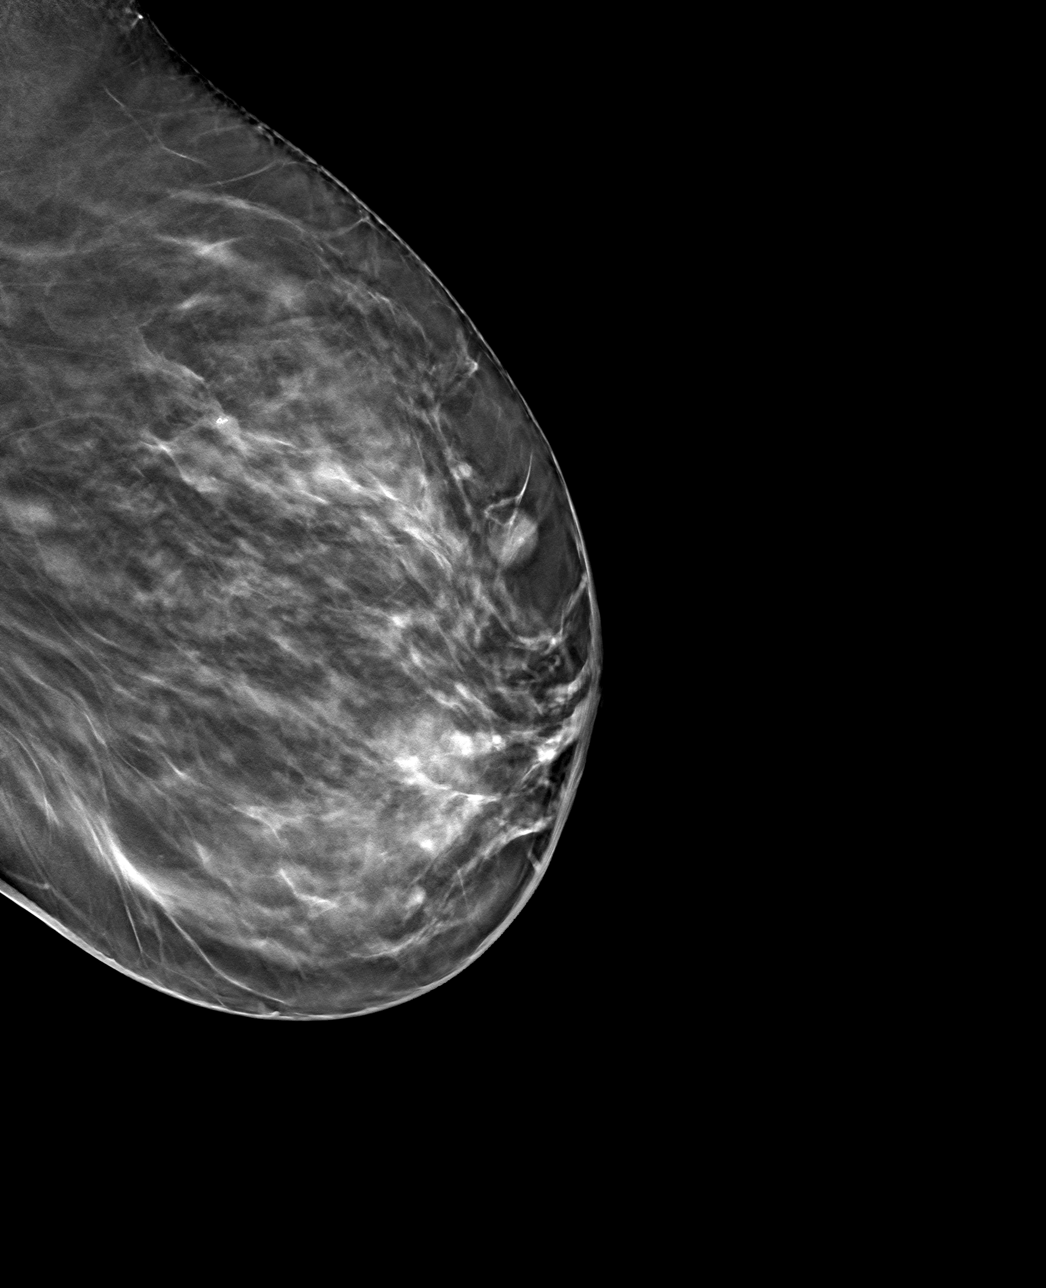

[4 of 12 positions shown; findings below may reference images not displayed]

FINDINGS: Mammographic images were obtained following ultrasound guided biopsy
of the 0.7 cm mass at the [DATE] position of the LEFT breast. The
RIBBON biopsy marking clip is in expected position at the site of
biopsy.
IMPRESSION: Appropriate positioning of the RIBBON shaped biopsy marking clip at
the site of biopsy in the UPPER LEFT breast.

Final Assessment: Post Procedure Mammograms for Marker Placement

## 2023-05-20 ENCOUNTER — Other Ambulatory Visit: Payer: Self-pay | Admitting: Hematology and Oncology

## 2023-05-21 ENCOUNTER — Ambulatory Visit
Admission: RE | Admit: 2023-05-21 | Discharge: 2023-05-21 | Disposition: A | Discharge status: Home / Self Care | Payer: BC Managed Care – PPO | Source: Ambulatory Visit | Attending: Hematology and Oncology

## 2023-05-21 ENCOUNTER — Other Ambulatory Visit: Payer: Self-pay | Admitting: Hematology and Oncology

## 2023-05-21 DIAGNOSIS — Z170 Estrogen receptor positive status [ER+]: Secondary | ICD-10-CM

## 2023-05-25 ENCOUNTER — Other Ambulatory Visit: Payer: Self-pay | Admitting: *Deleted

## 2023-05-25 ENCOUNTER — Inpatient Hospital Stay: Payer: BC Managed Care – PPO

## 2023-05-25 ENCOUNTER — Inpatient Hospital Stay: Payer: BC Managed Care – PPO | Attending: Hematology and Oncology | Admitting: Hematology and Oncology

## 2023-05-25 VITALS — BP 125/73 | HR 88 | Temp 98.4°F | Resp 18 | Ht 65.0 in | Wt 171.2 lb

## 2023-05-25 DIAGNOSIS — Z170 Estrogen receptor positive status [ER+]: Secondary | ICD-10-CM

## 2023-05-25 DIAGNOSIS — C50212 Malignant neoplasm of upper-inner quadrant of left female breast: Secondary | ICD-10-CM | POA: Diagnosis present

## 2023-05-25 DIAGNOSIS — Z79810 Long term (current) use of selective estrogen receptor modulators (SERMs): Secondary | ICD-10-CM | POA: Diagnosis not present

## 2023-05-25 LAB — CBC WITH DIFFERENTIAL/PLATELET
Abs Immature Granulocytes: 0.01 10*3/uL (ref 0.00–0.07)
Basophils Absolute: 0.1 10*3/uL (ref 0.0–0.1)
Basophils Relative: 1 %
Eosinophils Absolute: 0.1 10*3/uL (ref 0.0–0.5)
Eosinophils Relative: 2 %
HCT: 39.9 % (ref 36.0–46.0)
Hemoglobin: 13.3 g/dL (ref 12.0–15.0)
Immature Granulocytes: 0 %
Lymphocytes Relative: 27 %
Lymphs Abs: 1.9 10*3/uL (ref 0.7–4.0)
MCH: 28.8 pg (ref 26.0–34.0)
MCHC: 33.3 g/dL (ref 30.0–36.0)
MCV: 86.4 fL (ref 80.0–100.0)
Monocytes Absolute: 0.6 10*3/uL (ref 0.1–1.0)
Monocytes Relative: 8 %
Neutro Abs: 4.4 10*3/uL (ref 1.7–7.7)
Neutrophils Relative %: 62 %
Platelets: 243 10*3/uL (ref 150–400)
RBC: 4.62 MIL/uL (ref 3.87–5.11)
RDW: 12.8 % (ref 11.5–15.5)
WBC: 7 10*3/uL (ref 4.0–10.5)
nRBC: 0 % (ref 0.0–0.2)

## 2023-05-25 LAB — CMP (CANCER CENTER ONLY)
ALT: 11 U/L (ref 0–44)
AST: 13 U/L — ABNORMAL LOW (ref 15–41)
Albumin: 4.4 g/dL (ref 3.5–5.0)
Alkaline Phosphatase: 44 U/L (ref 38–126)
Anion gap: 10 (ref 5–15)
BUN: 12 mg/dL (ref 8–23)
CO2: 25 mmol/L (ref 22–32)
Calcium: 9.6 mg/dL (ref 8.9–10.3)
Chloride: 104 mmol/L (ref 98–111)
Creatinine: 0.78 mg/dL (ref 0.44–1.00)
GFR, Estimated: >60 mL/min (ref >60)
Glucose, Bld: 149 mg/dL — ABNORMAL HIGH (ref 70–99)
Potassium: 4 mmol/L (ref 3.5–5.1)
Sodium: 139 mmol/L (ref 135–145)
Total Bilirubin: 0.9 mg/dL (ref 0.3–1.2)
Total Protein: 7.6 g/dL (ref 6.5–8.1)

## 2023-05-25 NOTE — Progress Notes (Signed)
[facility]  Telephone:([phone]) [phone] Fax:([phone]) [phone]     ID: Christine Mosley DOB: 1955-10-29  MR#: [id number]  UXL#:[id number]  Patient Care Team: Christine Mosley., PA-C as PCP - General (Family Medicine) Christine Proud, RN as Oncology Nurse Navigator Christine Angelica, RN as Oncology Nurse Navigator Christine Loron, MD as Consulting Physician (General Surgery) Mosley, Christine Hue, MD (Inactive) as Consulting Physician (Oncology) Christine Peak, MD as Attending Physician (Radiation Oncology) Christine Finner, MD (Inactive) as Consulting Physician (Obstetrics and Gynecology) Christine Mcalpine, MD (Inactive) as Consulting Physician (Orthopedic Surgery) Christine Moulds, MD  CHIEF COMPLAINT: Estrogen receptor positive invasive breast cancer  CURRENT TREATMENT: tamoxifen.  INTERVAL HISTORY:  Christine Mosley returns today for follow up of her estrogen receptor positive breast cancer.   The patient, with a history of breast cancer, presents for a routine follow-up. She has been adhering to their prescribed tamoxifen regimen without any reported side effects. She was scheduled for a mammogram, but it was postponed due to technical issues at the imaging center. She has rescheduled the mammogram for the following week.  In addition to their breast cancer follow-up, the patient reports persistent shoulder pain, which she suspects is due to arthritis and bursitis, similar to a previous condition in their other shoulder. The pain is severe enough to interfere with daily activities such as washing dishes and causes significant discomfort at night, sometimes to the point of tears. She has been managing the pain with topical ointments, ibuprofen, and Tylenol, but these measures are often ineffective. She has an upcoming appointment to have the shoulder evaluated.  She reports some residual soreness in the breast, which she attributes to lifting heavy objects. She notes that one breast feels  heavier than the other, but she has not noticed any new lumps or changes.   COVID 19 VACCINATION STATUS: Pfizer x2 (second dose 05/05/2020) as of September 2022   HISTORY OF CURRENT ILLNESS: From prior intake note:  Christine Mosley has a history of stage pT2, pN0 right breast cancer, status post right lumpectomy and sentinel lymph node biopsy on 12/08/2003 showing: invasive ductal carcinoma, grade 2, 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 Estrogen and progesterone receptor both positive, and Her2 negative by FISH. Both axillary lymph nodes were negative for metastasis (0/2). She was treated with chemotherapy, radiation therapy, and tamoxifen. I last saw here in 06/2010 when she "graduated" from follow up.  More recently, she had routine screening mammography on 03/22/2020 showing a possible abnormality in the left breast. She underwent left diagnostic mammography with tomography and left breast ultrasonography at [facility] on 04/05/2020 showing: breast density category B; 7 mm mass at 11:30 in left breast; normal-appearing left axillary lymph nodes.  Accordingly on 04/06/2020 she proceeded to biopsy of the left breast area in question. The pathology from this procedure ([id number]) showed: invasive and in situ mammary carcinoma, e-cadherin positive, grade 2; calcifications. Prognostic indicators significant for: estrogen receptor, 95% positive and progesterone receptor, 2% positive, both with moderate staining intensity. Proliferation marker Ki67 at 5%. HER2 equivocal by immunohistochemistry (2+), but negative by fluorescent in situ hybridization with a signals ratio 1.23 and number per cell 1.9.  [name] subsequent history is as detailed below.   PAST MEDICAL HISTORY: Past Medical History:  Diagnosis Date   Breast cancer (HCC) 2005   right breast-lumpectomy, chemo. radiation   Breast cancer [facility]) 2021   left breast IDC with DCIS   Family history of lung cancer  Family history of prostate cancer    Family history of skin cancer    Personal history of chemotherapy    Personal history of radiation therapy     PAST SURGICAL HISTORY: Past Surgical History:  Procedure Laterality Date   ABDOMINAL HYSTERECTOMY     BREAST BIOPSY Left 04/06/2020   BREAST LUMPECTOMY Right 2005   BREAST LUMPECTOMY Left 04/27/2020   BREAST LUMPECTOMY WITH RADIOACTIVE SEED AND SENTINEL LYMPH NODE BIOPSY Left 04/27/2020   Procedure: LEFT BREAST LUMPECTOMY WITH RADIOACTIVE SEED AND LEFT AXILLARY SENTINEL LYMPH NODE BIOPSY;  Surgeon: Christine Loron, MD;  Location: [facility];  Service: General;  Laterality: Left;   SHOULDER SURGERY      FAMILY HISTORY: Family History  Problem Relation Age of Onset   Congestive Heart Failure Mother    Prostate cancer Father 79       metastatic   Lung cancer Sister 31   Liver cancer Maternal Aunt 58   Skin cancer Sister        multiple   Cancer Maternal Aunt        unknown type, dx. in her 59s   Lung cancer Cousin        paternal first cousin   Cancer Cousin        mouth cancer; paternal first cousin   Cancer Cousin        nose cancer; paternal first cousin   Her father died at age 32 from prostate cancer, which was diagnosed at age 27. Her mother died at age 90 from CHF. [name] has 2 sisters (and 0 brothers). She reports lung cancer in her sister, diagnosed at age 57; she succumbed to this disease.   GYNECOLOGIC HISTORY:  No LMP recorded. Patient has had a hysterectomy. Menarche: age unsure Age at first live birth: 67 years old GX P 2 LMP 2005 Contraceptive: never used HRT never used  Hysterectomy? Yes, 01/2008, for uterine prolapse BSO? Yes (see report [facility] is [phone]).   SOCIAL HISTORY: (updated 03/2020)  Christine Mosley works as a Neurosurgeon. Husband Christine "Christine Mosley" is a dye cutter. She lives at home with husband Christine Mosley and son Christine Mosley, who is 68. Son Christine Mosley, age 62, is a Engineer, agricultural in [place]. Christine Mosley  has one granddaughter, age 18. She attends a [employer], counselling church.    ADVANCED DIRECTIVES: In the absence of any documentation to the contrary, the patient's spouse is their HCPOA.    HEALTH MAINTENANCE: Social History   Tobacco Use   Smoking status: Never   Smokeless tobacco: Never  Vaping Use   Vaping status: Never Used  Substance Use Topics   Alcohol use: Never   Drug use: Never     Colonoscopy: never done, Cologuard in 2020  PAP: 03/2020, negative  Bone density: yes, date unknown   Allergies  Allergen Reactions   Codeine Nausea And Vomiting    Current Outpatient Medications  Medication Sig Dispense Refill   acetaminophen (TYLENOL) 325 MG tablet Take 650 mg by mouth every 6 (six) hours as needed.     anastrozole (ARIMIDEX) 1 MG tablet Take 1 tablet (1 mg total) by mouth daily. 90 tablet 4   cholecalciferol (VITAMIN D3) 25 MCG (1000 UNIT) tablet Take 1 tablet (1,000 Units total) by mouth daily. 90 tablet 4   lidocaine (LIDODERM) 5 % Place 1 patch onto the skin daily. Remove & Discard patch within 12 hours or as directed by MD 30 patch 2   tamoxifen (NOLVADEX) 20 MG tablet TAKE 1  TABLET BY MOUTH EVERY DAY 90 tablet 3   No current facility-administered medications for this visit.    OBJECTIVE: White woman who appears stated  Vitals:   05/25/23 1147  BP: 125/73  Pulse: 88  Resp: 18  Temp: 98.4 F (36.9 C)  SpO2: 98%       Body mass index is 28.49 kg/m.   Wt Readings from Last 3 Encounters:  05/25/23 171 lb 3.2 oz (77.7 kg)  05/23/22 166 lb 6.4 oz (75.5 kg)  05/16/21 174 lb 11.2 oz (79.2 kg)      ECOG FS:1 - Symptomatic but completely ambulatory  Physical Exam Constitutional:      Appearance: Normal appearance.  Chest:     Comments: Bilateral breasts inspected.  Right breast smaller than the left breast.  Left breast with postradiation changes.  No palpable masses in either breast.  No regional adenopathy. Musculoskeletal:     Cervical back: Normal range  of motion and neck supple. No rigidity.  Lymphadenopathy:     Cervical: No cervical adenopathy.  Neurological:     Mental Status: She is alert.       LAB RESULTS:  CMP     Component Value Date/Time   NA 141 05/16/2021 0923   K 4.3 05/16/2021 0923   CL 107 05/16/2021 0923   CO2 22 05/16/2021 0923   GLUCOSE 158 (H) 05/16/2021 0923   BUN 11 05/16/2021 0923   CREATININE 0.81 05/16/2021 0923   CALCIUM 9.9 05/16/2021 0923   PROT 7.6 05/16/2021 0923   ALBUMIN 4.2 05/16/2021 0923   AST 19 05/16/2021 0923   ALT 20 05/16/2021 0923   ALKPHOS 65 05/16/2021 0923   BILITOT 1.0 05/16/2021 0923   GFRNONAA >60 05/16/2021 0923   GFRAA >60 04/14/2020 0824    No results found for: "TOTALPROTELP", "ALBUMINELP", "A1GS", "A2GS", "BETS", "BETA2SER", "GAMS", "MSPIKE", "SPEI"  Lab Results  Component Value Date   WBC 6.1 05/16/2021   NEUTROABS 4.0 05/16/2021   HGB 14.0 05/16/2021   HCT 40.6 05/16/2021   MCV 85.1 05/16/2021   PLT 251 05/16/2021    Lab Results  Component Value Date   LABCA2 17 07/04/2010    No components found for: "[id number]"  No results for input(s): "INR" in the last 168 hours.  Lab Results  Component Value Date   LABCA2 17 07/04/2010    No results found for: "CAN199"  No results found for: "CAN125"  No results found for: "CAN153"  No results found for: "CA2729"  No components found for: "HGQUANT"  No results found for: "CEA1", "CEA" / No results found for: "CEA1", "CEA"   No results found for: "AFPTUMOR"  No results found for: "CHROMOGRNA"  No results found for: "KPAFRELGTCHN", "LAMBDASER", "KAPLAMBRATIO" (kappa/lambda light chains)  No results found for: "HGBA", "HGBA2QUANT", "HGBFQUANT", "HGBSQUAN" (Hemoglobinopathy evaluation)   Lab Results  Component Value Date   LDH 148 10/09/2006    No results found for: "IRON", "TIBC", "IRONPCTSAT" (Iron and TIBC)  No results found for: "FERRITIN"  Urinalysis    Component Value Date/Time    COLORURINE YELLOW 01/31/2008 1217   APPEARANCEUR CLEAR 01/31/2008 1217   LABSPEC 1.015 01/31/2008 1217   PHURINE 6.0 01/31/2008 1217   GLUCOSEU NEGATIVE 01/31/2008 1217   HGBUR NEGATIVE 01/31/2008 1217   BILIRUBINUR NEGATIVE 01/31/2008 1217   KETONESUR NEGATIVE 01/31/2008 1217   PROTEINUR NEGATIVE 01/31/2008 1217   UROBILINOGEN 0.2 01/31/2008 1217   NITRITE NEGATIVE 01/31/2008 1217   LEUKOCYTESUR  01/31/2008 1217  NEGATIVE MICROSCOPIC NOT DONE ON URINES WITH NEGATIVE PROTEIN, BLOOD, LEUKOCYTES, NITRITE, OR GLUCOSE <1000 mg/dL.    STUDIES: No results found.   ELIGIBLE FOR AVAILABLE RESEARCH PROTOCOL: AET  ASSESSMENT: 67 y.o. [place] woman  (1) status post right breast upper outer quadrant lumpectomy 04 19 2005 for a pT2 pN0, stage IB invasive breast cancer, estrogen and progesterone receptor positive, HER-2 not amplified  (a) a total of 2 right sentinel lymph nodes were removed  (b) status post adjuvant chemotherapy  (c) status post adjuvant radiation  (d) status post tamoxifen x5+ years  (2) status post left breast upper inner quadrant biopsy 04/06/2020 for a clinical T1b N0, stage IA invasive ductal carcinoma, grade 2, E-cadherin positive, estrogen and progesterone receptor positive, HER-2 not amplified, with an MIB-1 of 5%  (3) status post left lumpectomy and sentinel lymph node sampling 04/27/2020 for a pT1b pN1, stage IB invasive ductal carcinoma, grade 1, with negative margins  (a) a total of 1 axillary lymph node was removed  (4) MammaPrint obtained from the definitive surgical sample showed a luminal a tumor, low risk, with no significant benefit anticipated from chemotherapy  (5) adjuvant radiation 06/03/2020 through 07/14/2020.  (6) genetics testing 04/23/2020 through the Common Hereditary Cancers Panel offered by [facility] found no deleterious mutations in APC, ATM, AXIN2, BARD1, BMPR1A, BRCA1, BRCA2, BRIP1, CDH1, CDK4, CDKN2A (p14ARF), CDKN2A (p16INK4a), CHEK2,  CTNNA1, DICER1, EPCAM (Deletion/duplication testing only), GREM1 (promoter region deletion/duplication testing only), KIT, MEN1, MLH1, MSH2, MSH3, MSH6, MUTYH, NBN, NF1, NTHL1, PALB2, PDGFRA, PMS2, POLD1, POLE, PTEN, RAD50, RAD51C, RAD51D, RNF43, SDHB, SDHC, SDHD, SMAD4, SMARCA4. STK11, TP53, TSC1, TSC2, and VHL.  The following genes were evaluated for sequence changes only: SDHA and HOXB13 c.251G>A variant only.    (a) . A variant of uncertain significance (VUS) was detected in the POLE gene called c.6322G>T. T  (7) anastrozole started 07/26/2020  (a) bone density 07/30/2020 shows a T score of -1.1 (nearly normal).    PLAN:  Breast Cancer  Patient tolerating Tamoxifen well with no reported side effects. Mammogram scheduled but was cancelled due to technical issues, rescheduled for the following week. -Continue Tamoxifen as prescribed. -Complete mammogram as scheduled. -NO AE reported. NO concerns on ROS or PE  Shoulder Pain Patient reports significant pain in right shoulder, similar to previous symptoms in left shoulder which was diagnosed with bursitis and arthritis. Pain is severe enough to interfere with daily activities and sleep. -Encourage patient to follow up with orthopedic evaluation as planned.  General Health Maintenance -Order annual labs today. -Follow up in 1 year or sooner if needed.  Total time spent: 30 min  *Total Encounter Time as defined by the Centers for Medicare and Medicaid Services includes, in addition to the face-to-face time of a patient visit (documented in the note above) non-face-to-face time: obtaining and reviewing outside history, ordering and reviewing medications, tests or procedures, care coordination (communications with other health care professionals or caregivers) and documentation in the medical record.

## 2023-05-30 ENCOUNTER — Ambulatory Visit
Admission: RE | Admit: 2023-05-30 | Discharge: 2023-05-30 | Disposition: A | Discharge status: Home / Self Care | Payer: BC Managed Care – PPO | Source: Ambulatory Visit | Attending: Hematology and Oncology

## 2023-05-30 DIAGNOSIS — C50212 Malignant neoplasm of upper-inner quadrant of left female breast: Secondary | ICD-10-CM

## 2024-02-21 ENCOUNTER — Other Ambulatory Visit: Payer: Self-pay | Admitting: Family Medicine

## 2024-02-21 DIAGNOSIS — C439 Malignant melanoma of skin, unspecified: Secondary | ICD-10-CM

## 2024-05-23 ENCOUNTER — Telehealth: Payer: Self-pay

## 2024-05-23 NOTE — Telephone Encounter (Signed)
 Left message for patient about upcoming appointment 10/6

## 2024-05-26 ENCOUNTER — Other Ambulatory Visit: Payer: Self-pay

## 2024-05-26 ENCOUNTER — Inpatient Hospital Stay: Payer: BC Managed Care – PPO | Admitting: Hematology and Oncology

## 2024-05-26 ENCOUNTER — Inpatient Hospital Stay: Payer: BC Managed Care – PPO | Attending: Hematology and Oncology

## 2024-05-26 VITALS — BP 125/68 | HR 88 | Temp 97.8°F | Resp 16 | Wt 164.7 lb

## 2024-05-26 DIAGNOSIS — C50212 Malignant neoplasm of upper-inner quadrant of left female breast: Secondary | ICD-10-CM | POA: Insufficient documentation

## 2024-05-26 DIAGNOSIS — Z170 Estrogen receptor positive status [ER+]: Secondary | ICD-10-CM | POA: Insufficient documentation

## 2024-05-26 DIAGNOSIS — Z79810 Long term (current) use of selective estrogen receptor modulators (SERMs): Secondary | ICD-10-CM | POA: Insufficient documentation

## 2024-05-26 DIAGNOSIS — R7309 Other abnormal glucose: Secondary | ICD-10-CM | POA: Insufficient documentation

## 2024-05-26 DIAGNOSIS — R3129 Other microscopic hematuria: Secondary | ICD-10-CM | POA: Insufficient documentation

## 2024-05-26 DIAGNOSIS — N390 Urinary tract infection, site not specified: Secondary | ICD-10-CM | POA: Insufficient documentation

## 2024-05-26 LAB — CMP (CANCER CENTER ONLY)
ALT: 13 U/L (ref 0–44)
AST: 13 U/L — ABNORMAL LOW (ref 15–41)
Albumin: 4.7 g/dL (ref 3.5–5.0)
Alkaline Phosphatase: 56 U/L (ref 38–126)
Anion gap: 8 (ref 5–15)
BUN: 11 mg/dL (ref 8–23)
CO2: 26 mmol/L (ref 22–32)
Calcium: 10.2 mg/dL (ref 8.9–10.3)
Chloride: 105 mmol/L (ref 98–111)
Creatinine: 0.74 mg/dL (ref 0.44–1.00)
GFR, Estimated: >60 mL/min (ref >60)
Glucose, Bld: 195 mg/dL — ABNORMAL HIGH (ref 70–99)
Potassium: 4.5 mmol/L (ref 3.5–5.1)
Sodium: 139 mmol/L (ref 135–145)
Total Bilirubin: 0.9 mg/dL (ref 0.0–1.2)
Total Protein: 7.5 g/dL (ref 6.5–8.1)

## 2024-05-26 LAB — HEMOGLOBIN A1C
Hgb A1c MFr Bld: 7.7 % — ABNORMAL HIGH (ref 4.8–5.6)
Mean Plasma Glucose: 174.29 mg/dL

## 2024-05-26 NOTE — Progress Notes (Signed)
 [facility]  [phone]) [phone] [phone]     ID: Christine Mosley DOB: 10-03-1955  MR#: [id number]  RDW#:[id number]  Patient Care Team: Debrah Josette LELON., PA-C as PCP - General (Family Medicine) Tyree Nanetta SAILOR, RN as Oncology Nurse Navigator Ebbie Cough, MD as Consulting Physician (General Surgery) Izell Domino, MD as Attending Physician (Radiation Oncology) Rosalynn LELON Ingle, MD (Inactive) as Consulting Physician (Obstetrics and Gynecology) Gerome Charleston, MD (Inactive) as Consulting Physician (Orthopedic Surgery) Amber Stalls, MD  CHIEF COMPLAINT: Estrogen receptor positive invasive breast cancer  CURRENT TREATMENT: tamoxifen .  INTERVAL HISTORY:  Christine Mosley returns today for follow up of her estrogen receptor positive breast cancer.   Discussed the use of AI scribe software for clinical note transcription with the patient, who gave verbal consent to proceed.  History of Present Illness Christine Mosley is a 68 year old female with breast cancer who presents for follow-up.  Discussed the use of AI scribe software for clinical note transcription with the patient, who gave verbal consent to proceed.  History of Present Illness Christine Mosley is a 68 year old female with breast cancer who presents for follow-up regarding her urinary tract infection and ongoing cancer treatment.  She has been experiencing bladder problems, including microscopic hematuria. She was diagnosed with a urinary tract infection and started on antibiotics yesterday, with a planned course of 14 days.  Her history of breast cancer dates back to 2005, initially treated with tamoxifen . A recurrence occurred in 2021, and she resumed tamoxifen  treatment in December 2021. She tolerates the medication well without new issues.  Her family history is significant for cardiovascular issues, as her brother, who is one year younger, has had several strokes, a heart attack, and now has a  pacemaker. This history motivates her to maintain a healthy lifestyle.  Rest of the pertinent 10 point ROS reviewed and neg.    COVID 19 VACCINATION STATUS: Pfizer x2 (second dose 05/05/2020) as of September 2022   HISTORY OF CURRENT ILLNESS: From prior intake note:  Christine Mosley has a history of stage pT2, pN0 right breast cancer, status post right lumpectomy and sentinel lymph node biopsy on 12/08/2003 showing: invasive ductal carcinoma, grade 2, 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 Estrogen and progesterone receptor both positive, and Her2 negative by FISH. Both axillary lymph nodes were negative for metastasis (0/2). She was treated with chemotherapy, radiation therapy, and tamoxifen . I last saw here in 06/2010 when she graduated from follow up.  More recently, she had routine screening mammography on 03/22/2020 showing a possible abnormality in the left breast. She underwent left diagnostic mammography with tomography and left breast ultrasonography at [facility] on 04/05/2020 showing: breast density category B; 7 mm mass at 11:30 in left breast; normal-appearing left axillary lymph nodes.  Accordingly on 04/06/2020 she proceeded to biopsy of the left breast area in question. The pathology from this procedure ([id number]) showed: invasive and in situ mammary carcinoma, e-cadherin positive, grade 2; calcifications. Prognostic indicators significant for: estrogen receptor, 95% positive and progesterone receptor, 2% positive, both with moderate staining intensity. Proliferation marker Ki67 at 5%. HER2 equivocal by immunohistochemistry (2+), but negative by fluorescent in situ hybridization with a signals ratio 1.23 and number per cell 1.9.  [name] subsequent history is as detailed below.   PAST MEDICAL HISTORY: Past Medical History:  Diagnosis Date   Breast cancer (HCC) 2005   right breast-lumpectomy, chemo. 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   Family history of lung cancer    Family history of prostate cancer    Family history of skin cancer    Personal history of chemotherapy    Personal history of radiation therapy     PAST SURGICAL HISTORY: Past Surgical History:  Procedure Laterality Date   ABDOMINAL HYSTERECTOMY     BREAST BIOPSY Left 04/06/2020   BREAST LUMPECTOMY Right 2005   BREAST LUMPECTOMY Left 04/27/2020   BREAST LUMPECTOMY WITH RADIOACTIVE SEED AND SENTINEL LYMPH NODE BIOPSY Left 04/27/2020   Procedure: LEFT BREAST LUMPECTOMY WITH RADIOACTIVE SEED AND LEFT AXILLARY SENTINEL LYMPH NODE BIOPSY;  Surgeon: Ebbie Cough, MD;  Location: [facility];  Service: General;  Laterality: Left;   SHOULDER SURGERY      FAMILY HISTORY: Family History  Problem Relation Age of Onset   Congestive Heart Failure Mother    Prostate cancer Father 19       metastatic   Lung cancer Sister 55   Liver cancer Maternal Aunt 58   Skin cancer Sister        multiple   Cancer Maternal Aunt        unknown type, dx. in her 36s   Lung cancer Cousin        paternal first cousin   Cancer Cousin        mouth cancer; paternal first cousin   Cancer Cousin        nose cancer; paternal first cousin   Her father died at age 58 from prostate cancer, which was diagnosed at age 89. Her mother died at age 32 from CHF. Christine Mosley has 2 sisters (and 0 brothers). She reports lung cancer in her sister, diagnosed at age 29; she succumbed to this disease.   GYNECOLOGIC HISTORY:  No LMP recorded. Patient has had a hysterectomy. Menarche: age unsure Age at first live birth: 68 years old GX P 2 LMP 2005 Contraceptive: never used HRT never used  Hysterectomy? Yes, 01/2008, for uterine prolapse BSO? Yes (see report [facility] is [id number]).   SOCIAL HISTORY: (updated 03/2020)  Christine Mosley works as a Neurosurgeon. Husband Arvin Sheena is a dye cutter. She lives at home with husband Sheena and son Rankin, who is 60. Son  Evalene, age 38, is a Engineer, agricultural in [place]. Greysen has one granddaughter, age 17. She attends a [employer], counselling church.    ADVANCED DIRECTIVES: In the absence of any documentation to the contrary, the patient's spouse is their HCPOA.    HEALTH MAINTENANCE: Social History   Tobacco Use   Smoking status: Never   Smokeless tobacco: Never  Vaping Use   Vaping status: Never Used  Substance Use Topics   Alcohol use: Never   Drug use: Never     Colonoscopy: never done, Cologuard in 2020  PAP: 03/2020, negative  Bone density: yes, date unknown   Allergies  Allergen Reactions   Codeine Nausea And Vomiting    Current Outpatient Medications  Medication Sig Dispense Refill   acetaminophen  (TYLENOL ) 325 MG tablet Take 650 mg by mouth every 6 (six) hours as needed.     cholecalciferol (VITAMIN D3) 25 MCG (1000 UNIT) tablet Take 1 tablet (1,000 Units total) by mouth daily. 90 tablet 4   lidocaine  (LIDODERM ) 5 % Place 1 patch onto the skin daily. Remove & Discard patch within 12 hours or as directed by MD 30 patch 2   tamoxifen  (NOLVADEX ) 20 MG tablet TAKE 1 TABLET BY MOUTH EVERY  DAY 90 tablet 3   No current facility-administered medications for this visit.    OBJECTIVE: White woman who appears stated  Vitals:   05/26/24 0849  BP: 125/68  Pulse: 88  Resp: 16  Temp: 97.8 F (36.6 C)  SpO2: 99%       Body mass index is 27.41 kg/m.   Wt Readings from Last 3 Encounters:  05/26/24 164 lb 11.2 oz (74.7 kg)  05/25/23 171 lb 3.2 oz (77.7 kg)  05/23/22 166 lb 6.4 oz (75.5 kg)      ECOG FS:1 - Symptomatic but completely ambulatory  Physical Exam Constitutional:      Appearance: Normal appearance.  Chest:     Comments: Bilateral breasts inspected. No palpable masses. No regional adenopathy Musculoskeletal:     Cervical back: Normal range of motion and neck supple. No rigidity.  Lymphadenopathy:     Cervical: No cervical adenopathy.  Neurological:     Mental Status: She is  alert.       LAB RESULTS:  CMP     Component Value Date/Time   NA 139 05/25/2023 1210   K 4.0 05/25/2023 1210   CL 104 05/25/2023 1210   CO2 25 05/25/2023 1210   GLUCOSE 149 (H) 05/25/2023 1210   BUN 12 05/25/2023 1210   CREATININE 0.78 05/25/2023 1210   CALCIUM 9.6 05/25/2023 1210   PROT 7.6 05/25/2023 1210   ALBUMIN 4.4 05/25/2023 1210   AST 13 (L) 05/25/2023 1210   ALT 11 05/25/2023 1210   ALKPHOS 44 05/25/2023 1210   BILITOT 0.9 05/25/2023 1210   GFRNONAA >60 05/25/2023 1210   GFRAA >60 04/14/2020 0824    No results found for: TOTALPROTELP, ALBUMINELP, A1GS, A2GS, BETS, BETA2SER, GAMS, MSPIKE, SPEI  Lab Results  Component Value Date   WBC 7.0 05/25/2023   NEUTROABS 4.4 05/25/2023   HGB 13.3 05/25/2023   HCT 39.9 05/25/2023   MCV 86.4 05/25/2023   PLT 243 05/25/2023    Lab Results  Component Value Date   LABCA2 17 07/04/2010    No components found for: [id number]  No results for input(s): INR in the last 168 hours.  Lab Results  Component Value Date   LABCA2 17 07/04/2010    No results found for: RJW800  No results found for: CAN125  No results found for: CAN153  No results found for: CA2729  No components found for: HGQUANT  No results found for: CEA1, CEA / No results found for: CEA1, CEA   No results found for: AFPTUMOR  No results found for: CHROMOGRNA  No results found for: KPAFRELGTCHN, LAMBDASER, KAPLAMBRATIO (kappa/lambda light chains)  No results found for: HGBA, HGBA2QUANT, HGBFQUANT, HGBSQUAN (Hemoglobinopathy evaluation)   Lab Results  Component Value Date   LDH 148 10/09/2006    No results found for: IRON, TIBC, IRONPCTSAT (Iron and TIBC)  No results found for: FERRITIN  Urinalysis    Component Value Date/Time   COLORURINE YELLOW 01/31/2008 1217   APPEARANCEUR CLEAR 01/31/2008 1217   LABSPEC 1.015 01/31/2008 1217   PHURINE 6.0 01/31/2008 1217    GLUCOSEU NEGATIVE 01/31/2008 1217   HGBUR NEGATIVE 01/31/2008 1217   BILIRUBINUR NEGATIVE 01/31/2008 1217   KETONESUR NEGATIVE 01/31/2008 1217   PROTEINUR NEGATIVE 01/31/2008 1217   UROBILINOGEN 0.2 01/31/2008 1217   NITRITE NEGATIVE 01/31/2008 1217   LEUKOCYTESUR  01/31/2008 1217    NEGATIVE MICROSCOPIC NOT DONE ON URINES WITH NEGATIVE PROTEIN, BLOOD, LEUKOCYTES, NITRITE, OR GLUCOSE <1000 mg/dL.    STUDIES: No results  found.   ELIGIBLE FOR AVAILABLE RESEARCH PROTOCOL: AET  ASSESSMENT: 68 y.o. [place] woman  (1) status post right breast upper outer quadrant lumpectomy 04 19 2005 for a pT2 pN0, stage IB invasive breast cancer, estrogen and progesterone receptor positive, HER-2 not amplified  (a) a total of 2 right sentinel lymph nodes were removed  (b) status post adjuvant chemotherapy  (c) status post adjuvant radiation  (d) status post tamoxifen  x5+ years  (2) status post left breast upper inner quadrant biopsy 04/06/2020 for a clinical T1b N0, stage IA invasive ductal carcinoma, grade 2, E-cadherin positive, estrogen and progesterone receptor positive, HER-2 not amplified, with an MIB-1 of 5%  (3) status post left lumpectomy and sentinel lymph node sampling 04/27/2020 for a pT1b pN1, stage IB invasive ductal carcinoma, grade 1, with negative margins  (a) a total of 1 axillary lymph node was removed  (4) MammaPrint obtained from the definitive surgical sample showed a luminal a tumor, low risk, with no significant benefit anticipated from chemotherapy  (5) adjuvant radiation 06/03/2020 through 07/14/2020.  (6) genetics testing 04/23/2020 through the Common Hereditary Cancers Panel offered by [facility] found no deleterious mutations in APC, ATM, AXIN2, BARD1, BMPR1A, BRCA1, BRCA2, BRIP1, CDH1, CDK4, CDKN2A (p14ARF), CDKN2A (p16INK4a), CHEK2, CTNNA1, DICER1, EPCAM (Deletion/duplication testing only), GREM1 (promoter region deletion/duplication testing only), KIT, MEN1, MLH1,  MSH2, MSH3, MSH6, MUTYH, NBN, NF1, NTHL1, PALB2, PDGFRA, PMS2, POLD1, POLE, PTEN, RAD50, RAD51C, RAD51D, RNF43, SDHB, SDHC, SDHD, SMAD4, SMARCA4. STK11, TP53, TSC1, TSC2, and VHL.  The following genes were evaluated for sequence changes only: SDHA and HOXB13 c.251G>A variant only.    (a) . A variant of uncertain significance (VUS) was detected in the POLE gene called c.6322G>T. T  (7) anastrozole  started 07/26/2020  (a) bone density 07/30/2020 shows a T score of -1.1 (nearly normal).    PLAN:  Assessment and Plan Assessment & Plan Breast cancer Breast cancer recurrence in 2021. On adjuvant tamoxifen  since December 2021, well-tolerated. - Continue tamoxifen  therapy. - Review mammogram results when available. - No concern for recurrence on exam today  Urinary tract infection with microscopic hematuria Recently diagnosed UTI with microscopic hematuria. On antibiotics. - Complete 14-day course of antibiotics. - Review blood work results when available.  Encouraged regular activity, weight bearing exercises as tolerated.  Total time spent: 30 min  *Total Encounter Time as defined by the Centers for Medicare and Medicaid Services includes, in addition to the face-to-face time of a patient visit (documented in the note above) non-face-to-face time: obtaining and reviewing outside history, ordering and reviewing medications, tests or procedures, care coordination (communications with other health care professionals or caregivers) and documentation in the medical record.

## 2024-05-28 ENCOUNTER — Ambulatory Visit: Payer: Self-pay | Admitting: Hematology and Oncology

## 2024-05-28 NOTE — Telephone Encounter (Signed)
-----   Message from [place] sent at 05/28/2024  8:10 AM EDT ----- I believe she wanted a HbA1c added to her labs, it doesn't look as well controlled. Can u share the results with her and her PCP.  Thanks, ----- Message ----- From: Interface, Lab In [place] Sent: 05/26/2024  10:08 AM EDT To: Amber Stalls, MD

## 2024-05-28 NOTE — Telephone Encounter (Signed)
 Attempted to call pt to make her aware of A1C results. LVM detailed since she has patient identifier on her VM. Results and most recent OV note faxed to Josette Aho, PA-C at [facility] - [place] [phone]. Fax confirmation received.

## 2024-06-02 ENCOUNTER — Ambulatory Visit
Admission: RE | Admit: 2024-06-02 | Discharge: 2024-06-02 | Disposition: A | Source: Ambulatory Visit | Attending: Family Medicine | Admitting: Family Medicine

## 2024-06-02 DIAGNOSIS — C439 Malignant melanoma of skin, unspecified: Secondary | ICD-10-CM

## 2024-06-05 ENCOUNTER — Emergency Department

## 2024-06-05 ENCOUNTER — Other Ambulatory Visit: Payer: Self-pay

## 2024-06-05 DIAGNOSIS — S066X0A Traumatic subarachnoid hemorrhage without loss of consciousness, initial encounter: Secondary | ICD-10-CM | POA: Insufficient documentation

## 2024-06-05 DIAGNOSIS — S80212A Abrasion, left knee, initial encounter: Secondary | ICD-10-CM | POA: Insufficient documentation

## 2024-06-05 DIAGNOSIS — Z23 Encounter for immunization: Secondary | ICD-10-CM | POA: Diagnosis not present

## 2024-06-05 DIAGNOSIS — H05231 Hemorrhage of right orbit: Secondary | ICD-10-CM | POA: Diagnosis not present

## 2024-06-05 DIAGNOSIS — S80211A Abrasion, right knee, initial encounter: Secondary | ICD-10-CM | POA: Insufficient documentation

## 2024-06-05 DIAGNOSIS — Z853 Personal history of malignant neoplasm of breast: Secondary | ICD-10-CM | POA: Diagnosis not present

## 2024-06-05 DIAGNOSIS — S0083XA Contusion of other part of head, initial encounter: Secondary | ICD-10-CM | POA: Diagnosis not present

## 2024-06-05 DIAGNOSIS — W19XXXA Unspecified fall, initial encounter: Secondary | ICD-10-CM | POA: Diagnosis not present

## 2024-06-05 DIAGNOSIS — W010XXA Fall on same level from slipping, tripping and stumbling without subsequent striking against object, initial encounter: Secondary | ICD-10-CM | POA: Insufficient documentation

## 2024-06-05 DIAGNOSIS — S0990XA Unspecified injury of head, initial encounter: Secondary | ICD-10-CM | POA: Diagnosis present

## 2024-06-05 NOTE — ED Triage Notes (Signed)
 [place] and fell face forward on the pavement. Denies LOC. Believes it to be a mechanical fall. No blood thinners. No c/o chest pain or dizziness. Noted large hematoma to R eye. Has abrasions to knees and hands. (Currently on ABT/UTI)

## 2024-06-06 ENCOUNTER — Emergency Department

## 2024-06-06 ENCOUNTER — Telehealth: Payer: Self-pay

## 2024-06-06 ENCOUNTER — Emergency Department
Admission: EM | Admit: 2024-06-06 | Discharge: 2024-06-06 | Disposition: A | Discharge status: Home / Self Care | Attending: Emergency Medicine | Admitting: Emergency Medicine

## 2024-06-06 DIAGNOSIS — S066X0A Traumatic subarachnoid hemorrhage without loss of consciousness, initial encounter: Secondary | ICD-10-CM | POA: Diagnosis not present

## 2024-06-06 DIAGNOSIS — H05231 Hemorrhage of right orbit: Secondary | ICD-10-CM | POA: Diagnosis present

## 2024-06-06 DIAGNOSIS — S069XAA Unspecified intracranial injury with loss of consciousness status unknown, initial encounter: Secondary | ICD-10-CM | POA: Diagnosis present

## 2024-06-06 DIAGNOSIS — I609 Nontraumatic subarachnoid hemorrhage, unspecified: Secondary | ICD-10-CM

## 2024-06-06 DIAGNOSIS — W19XXXA Unspecified fall, initial encounter: Secondary | ICD-10-CM

## 2024-06-06 LAB — COMPREHENSIVE METABOLIC PANEL WITH GFR
ALT: 12 U/L (ref 0–44)
AST: 19 U/L (ref 15–41)
Albumin: 3.9 g/dL (ref 3.5–5.0)
Alkaline Phosphatase: 43 U/L (ref 38–126)
Anion gap: 10 (ref 5–15)
BUN: 13 mg/dL (ref 8–23)
CO2: 23 mmol/L (ref 22–32)
Calcium: 9.1 mg/dL (ref 8.9–10.3)
Chloride: 109 mmol/L (ref 98–111)
Creatinine, Ser: 0.57 mg/dL (ref 0.44–1.00)
GFR, Estimated: >60 mL/min (ref >60)
Glucose, Bld: 159 mg/dL — ABNORMAL HIGH (ref 70–99)
Potassium: 3.6 mmol/L (ref 3.5–5.1)
Sodium: 142 mmol/L (ref 135–145)
Total Bilirubin: 1 mg/dL (ref 0.0–1.2)
Total Protein: 7 g/dL (ref 6.5–8.1)

## 2024-06-06 LAB — CBC WITH DIFFERENTIAL/PLATELET
Abs Immature Granulocytes: 0.01 K/uL (ref 0.00–0.07)
Basophils Absolute: 0 K/uL (ref 0.0–0.1)
Basophils Relative: 1 %
Eosinophils Absolute: 0.1 K/uL (ref 0.0–0.5)
Eosinophils Relative: 2 %
HCT: 37.6 % (ref 36.0–46.0)
Hemoglobin: 12.8 g/dL (ref 12.0–15.0)
Immature Granulocytes: 0 %
Lymphocytes Relative: 33 %
Lymphs Abs: 2.3 K/uL (ref 0.7–4.0)
MCH: 29.4 pg (ref 26.0–34.0)
MCHC: 34 g/dL (ref 30.0–36.0)
MCV: 86.2 fL (ref 80.0–100.0)
Monocytes Absolute: 0.7 K/uL (ref 0.1–1.0)
Monocytes Relative: 10 %
Neutro Abs: 3.9 K/uL (ref 1.7–7.7)
Neutrophils Relative %: 54 %
Platelets: 211 K/uL (ref 150–400)
RBC: 4.36 MIL/uL (ref 3.87–5.11)
RDW: 12.5 % (ref 11.5–15.5)
WBC: 7.1 K/uL (ref 4.0–10.5)
nRBC: 0 % (ref 0.0–0.2)

## 2024-06-06 LAB — PROTIME-INR
INR: 1.1 (ref 0.8–1.2)
Prothrombin Time: 14.5 s (ref 11.4–15.2)

## 2024-06-06 LAB — APTT: aPTT: 26 s (ref 24–36)

## 2024-06-06 MED ORDER — LEVETIRACETAM 500 MG PO TABS: 500.0000 mg | ORAL_TABLET | Freq: Two times a day (BID) | ORAL | 0 refills | Status: DC

## 2024-06-06 MED ORDER — TETANUS-DIPHTH-ACELL PERTUSSIS 5-2-15.5 LF-MCG/0.5 IM SUSP
0.5000 mL | Freq: Once | INTRAMUSCULAR | Status: AC
  Administered 2024-06-06: 0.5 mL via INTRAMUSCULAR
  Filled 2024-06-06: qty 0.5

## 2024-06-06 MED ORDER — LEVETIRACETAM 500 MG PO TABS
500.0000 mg | ORAL_TABLET | Freq: Once | ORAL | Status: AC
  Administered 2024-06-06: 500 mg via ORAL
  Filled 2024-06-06: qty 1

## 2024-06-06 NOTE — ED Notes (Signed)
 Pt updated on plan of care. States no other needs at this time.

## 2024-06-06 NOTE — ED Notes (Signed)
 Patient transported to CT

## 2024-06-06 NOTE — Consult Note (Addendum)
 Consulting Department:  Emergency department  Primary Physician:  Pcp, No  Chief Complaint: Fall with intracranial hemorrhage  History of Present Illness: 06/06/2024 Christine Mosley is a 68 y.o. female who presents with the chief complaint of fall with an intracranial hemorrhage.  She was in normal state of health until [place] with facial trauma.  Patient has a mild headache.  No weakness numbness or tingling.  No new seizures.  No evidence of CSF leak.  No fluid running from the eye ear or nose.  She did not lose consciousness.  She is not on any blood thinners at baseline.  Review of Systems:  A 10 point review of systems is negative, except for the pertinent positives and negatives detailed in the HPI.  Past Medical History: Past Medical History:  Diagnosis Date   Breast cancer (HCC) 2005   right breast-lumpectomy, chemo. 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   Family history of lung cancer    Family history of prostate cancer    Family history of skin cancer    Personal history of chemotherapy    Personal history of radiation therapy     Past Surgical History: Past Surgical History:  Procedure Laterality Date   ABDOMINAL HYSTERECTOMY     BREAST BIOPSY Left 04/06/2020   BREAST LUMPECTOMY Right 2005   BREAST LUMPECTOMY Left 04/27/2020   BREAST LUMPECTOMY WITH RADIOACTIVE SEED AND SENTINEL LYMPH NODE BIOPSY Left 04/27/2020   Procedure: LEFT BREAST LUMPECTOMY WITH RADIOACTIVE SEED AND LEFT AXILLARY SENTINEL LYMPH NODE BIOPSY;  Surgeon: Ebbie Cough, MD;  Location: [facility];  Service: General;  Laterality: Left;   SHOULDER SURGERY      Allergies: Allergies as of 06/05/2024 - Review Complete 06/05/2024  Allergen Reaction Noted   Codeine Nausea And Vomiting 04/14/2020    Medications: No current facility-administered medications for this encounter.  Current Outpatient  Medications:    acetaminophen  (TYLENOL ) 325 MG tablet, Take 650 mg by mouth every 6 (six) hours as needed., Disp: , Rfl:    cholecalciferol (VITAMIN D3) 25 MCG (1000 UNIT) tablet, Take 1 tablet (1,000 Units total) by mouth daily., Disp: 90 tablet, Rfl: 4   lidocaine  (LIDODERM ) 5 %, Place 1 patch onto the skin daily. Remove & Discard patch within 12 hours or as directed by MD, Disp: 30 patch, Rfl: 2   tamoxifen  (NOLVADEX ) 20 MG tablet, TAKE 1 TABLET BY MOUTH EVERY DAY, Disp: 90 tablet, Rfl: 3   Social History: Social History   Tobacco Use   Smoking status: Never   Smokeless tobacco: Never  Vaping Use   Vaping status: Never Used  Substance Use Topics   Alcohol use: Never   Drug use: Never    Family Medical History: Family History  Problem Relation Age of Onset   Congestive Heart Failure Mother    Prostate cancer Father 62       metastatic   Lung cancer Sister 25   Liver cancer Maternal Aunt 58   Skin cancer Sister        multiple   Cancer Maternal Aunt        unknown type, dx. in her 81s   Lung cancer Cousin        paternal first cousin   Cancer Cousin        mouth cancer; paternal first cousin   Cancer Cousin  nose cancer; paternal first cousin    Physical Examination: Vitals:   06/05/24 2052 06/06/24 0105  BP: (!) 135/56 138/78  Pulse: 88 80  Resp: 18 15  Temp: 99.6 F (37.6 C)   SpO2: 100% 96%     General: Patient is well developed, well nourished, calm, collected, and in no apparent distress.  NEUROLOGICAL:  General: In no acute distress.   Awake, alert, oriented to person, place, and time.  Pupils equal round and reactive to light.  With the exception of her right periorbital hematoma with significant swelling her face is symmetric at rest and with motion.  Tongue protrusion is midline.  Bilateral upper extremities are full strength proximally and distally.  There is no pronator drift.  Language is conversant.  GCS: 15   Bilateral upper and lower  extremity sensation is intact to light touch.  Imaging: CT HEAD WO CONTRAST ( ) Result Date: 06/06/2024 EXAM: CT HEAD WITHOUT CONTRAST 06/06/2024 05:03:01 AM TECHNIQUE: CT of the head was performed without the administration of intravenous contrast. Automated exposure control, iterative reconstruction, and/or weight based adjustment of the mA/kV was utilized to reduce the radiation dose to as low as reasonably achievable. COMPARISON: 06/05/2024 CLINICAL HISTORY: Headache, new onset (Age >= 51y). F/u SAH. FINDINGS: BRAIN AND VENTRICLES: Similar trace right temporal subarachnoid hemorrhage. No evidence of acute infarct. No hydrocephalus. No extra-axial collection. No mass effect or midline shift. ORBITS: Right periorbital soft tissue swelling and hematoma. SINUSES: No acute abnormality. SOFT TISSUES AND SKULL: Multiple scalp lesions. No skull fracture. IMPRESSION: 1. Similar trace right temporal subarachnoid hemorrhage. 2. Right periorbital soft tissue swelling and hematoma. Multiple scalp lesions. Electronically signed by: Evalene Coho MD 06/06/2024 05:13 AM EDT RP Workstation: [id number]   CT Maxillofacial Wo Contrast Result Date: 06/05/2024 EXAM: CT OF THE FACE WITHOUT CONTRAST 06/05/2024 11:09:34 PM TECHNIQUE: CT of the face was performed without the administration of intravenous contrast. Multiplanar reformatted images are provided for review. Automated exposure control, iterative reconstruction, and/or weight based adjustment of the mA/kV was utilized to reduce the radiation dose to as low as reasonably achievable. COMPARISON: None available. CLINICAL HISTORY: Facial trauma, blunt. [place]pped and fell face forward on the pavement. Denies LOC. Believes it to be a mechanical fall. No blood thinners. No c/o chest pain or dizziness. Noted large hematoma to R eye. Has abrasions to knees and hands. FINDINGS: FACIAL BONES: No acute facial fracture. No mandibular dislocation. No  suspicious bone lesion. ORBITS: Globes are intact. No acute traumatic injury. No inflammatory change. SINUSES AND MASTOIDS: No acute abnormality. SOFT TISSUES: Mild soft tissue swelling/extracranial hematoma overlying the right frontal bone and lateral orbit. IMPRESSION: 1. No acute facial fracture. 2. Right facial soft tissue swelling/hematoma. Electronically signed by: Pinkie Pebbles MD 06/05/2024 11:29 PM EDT RP Workstation: [id number]   CT Head Wo Contrast Result Date: 06/05/2024 EXAM: CT HEAD WITHOUT CONTRAST 06/05/2024 11:09:34 PM TECHNIQUE: CT of the head was performed without the administration of intravenous contrast. Automated exposure control, iterative reconstruction, and/or weight based adjustment of the mA/kV was utilized to reduce the radiation dose to as low as reasonably achievable. COMPARISON: None available. CLINICAL HISTORY: FINDINGS: BRAIN AND VENTRICLES: Trace right temporal lobe subarachnoid hemorrhage. No evidence of acute infarct. No hydrocephalus. No extra-axial collection. No mass effect or midline shift. ORBITS: Mild soft tissue swelling/hematoma overlying the right frontal bone and lateral orbit. SINUSES: No acute abnormality. SOFT TISSUES AND SKULL: Mild soft tissue swelling/hematoma overlying the right frontal bone and  lateral orbit. No skull fracture. IMPRESSION: 1. Trace right temporal lobe subarachnoid hemorrhage. 2. Mild soft tissue swelling/hematoma over the right frontal bone and lateral orbit. No calvarial fracture. 3. Critical Value/emergent results were called by telephone at the time of interpretation on 06/05/2024 at 2325 hrs to provider Dr Viviann. Electronically signed by: Pinkie Pebbles MD 06/05/2024 11:28 PM EDT RP Workstation: [id number]   CT Cervical Spine Wo Contrast Result Date: 06/05/2024 EXAM: CT CERVICAL SPINE WITHOUT CONTRAST 06/05/2024 11:09:34 PM TECHNIQUE: CT of the cervical spine was performed without the administration of intravenous contrast.  Multiplanar reformatted images are provided for review. Automated exposure control, iterative reconstruction, and/or weight based adjustment of the mA/kV was utilized to reduce the radiation dose to as low as reasonably achievable. COMPARISON: None available. CLINICAL HISTORY: Facial trauma, blunt. [place], [place] and fell face forward on the pavement. Denies LOC. Believes it to be a mechanical fall. No blood thinners. No c/o chest pain or dizziness. Noted large hematoma to R eye. Has abrasions to knees and hands. FINDINGS: CERVICAL SPINE: BONES AND ALIGNMENT: No acute fracture or traumatic malalignment. DEGENERATIVE CHANGES: Mild degenerative changes of the mid cervical spine. SOFT TISSUES: No prevertebral soft tissue swelling. IMPRESSION: 1. No acute abnormality of the cervical spine. Electronically signed by: Pinkie Pebbles MD 06/05/2024 11:16 PM EDT RP Workstation: [id number]     I have personally reviewed the images and agree with the above interpretation.  Labs:    Latest Ref Rng & Units 05/25/2023   12:10 PM 05/16/2021    9:23 AM 10/18/2020    8:59 AM  CBC  WBC 4.0 - 10.5 K/uL 7.0  6.1  9.6   Hemoglobin 12.0 - 15.0 g/dL 86.6  85.9  85.9   Hematocrit 36.0 - 46.0 % 39.9  40.6  41.5   Platelets 150 - 400 K/uL 243  251  238       Latest Ref Rng & Units 05/26/2024    8:59 AM 05/25/2023   12:10 PM 05/16/2021    9:23 AM  BMP  Glucose 70 - 99 mg/dL 804  850  841   BUN 8 - 23 mg/dL 11  12  11    Creatinine 0.44 - 1.00 mg/dL 9.25  9.21  9.18   Sodium 135 - 145 mmol/L 139  139  141   Potassium 3.5 - 5.1 mmol/L 4.5  4.0  4.3   Chloride 98 - 111 mmol/L 105  104  107   CO2 22 - 32 mmol/L 26  25  22    Calcium 8.9 - 10.3 mg/dL 89.7  9.6  9.9         Assessment and Plan: Assessment & Plan 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 female with a recent fall without loss of consciousness.  She struck her face has a significant facial trauma.  She did not lose consciousness.  Not having any new neurologic deficits.  No seizures.  No evidence of CSF leak.  On physical examination she shows a right periorbital hematoma with full extraocular motions and reactive pupils bilaterally.  She has no localizing signs on her physical examination.  Her CT scan initially demonstrated a traumatic subarachnoid hemorrhage in addition to the periorbital hematoma without evidence of skull fracture.  The traumatic subarachnoid hemorrhage was in the temporal region.  Repeat head CT did not  demonstrate any worsening.  Cervical spine CT did not demonstrate any acute fracture.  Specifically there was a small amount of subarachnoid hemorrhage without mass effect, no evidence of hydrocephalus.  For treatment plan would do seizure prophylaxis, Keppra 500 twice daily.  This can continue for a single week.  Would like to follow-up with her in clinic in approximately 3 to 4 weeks with a repeat head CT.  We discussed the signs and symptoms of traumatic brain injury for her to watch for.  Her husband was present for this discussion.  They are comfortable with the plan.  From a neurosurgical standpoint is stable for discharge.  Penne MICAEL Sharps, MD/MSCR Dept. of Neurosurgery

## 2024-06-06 NOTE — Discharge Instructions (Addendum)
 Follow-up with neurosurgery for repeat CT imaging of the head take antiseizure medicine for the next 1 week return to the ER if you develop any fevers, confusion or any other concerns try to avoid any type of blood thinners such as aspirin, ibuprofen  BC powder.

## 2024-06-06 NOTE — ED Notes (Signed)
 Neuro surgery at bedside.

## 2024-06-06 NOTE — Telephone Encounter (Signed)
 clinic f/u Received: Today Claudene Penne ORN, MD  P [facility]-Neurosurgery Admin; [facility]-Neurosurgery Rn Clinic: [facility] Timeline: 2-4 weeks w/ repeat noncon head Tests to order: ct head non con

## 2024-06-06 NOTE — ED Provider Notes (Addendum)
 [facility] Provider Note    Event Date/Time   First MD Initiated Contact with Patient 06/06/24 0009     (approximate)   History   Fall   HPI  Christine Mosley is a 68 y.o. female who comes in with concerns for a fall.  Patient had mechanical fall when she tripped and fell.  She has a large hematoma to the right eye.  Patient is currently being treated for UTI.  She denies any fevers, weakness or any other concerns.  Her husband witnessed the strep she denied any LOC, chest pain, shortness of breath or any other concern she does report hitting her head and catching herself with her wrist.  She also reports bruising up her knees but has been ambulatory since then.     Physical Exam   Triage Vital Signs: ED Triage Vitals [06/05/24 2052]  Encounter Vitals Group     BP (!) 135/56     Girls Systolic BP Percentile      Girls Diastolic BP Percentile      Boys Systolic BP Percentile      Boys Diastolic BP Percentile      Pulse Rate 88     Resp 18     Temp 99.6 F (37.6 C)     Temp Source Oral     SpO2 100 %     Weight      Height      Head Circumference      Peak Flow      Pain Score 3     Pain Loc      Pain Education      Exclude from Growth Chart     Most recent vital signs: Vitals:   06/05/24 2052  BP: (!) 135/56  Pulse: 88  Resp: 18  Temp: 99.6 F (37.6 C)  SpO2: 100%     General: Awake, no distress.  CV:  Good peripheral perfusion.  Resp:  Normal effort.  Abd:  No distention.  Other:  Bruising and hematoma noted around the right forehead.  She has swelling noted around the right eye.  Pupil is reactive extraocular movements are intact.  No chest wall tenderness no abdominal tenderness.  She does have some pain in her wrist but no snuffbox tenderness.  Some abrasions on her knees but no significant pain able to flex and extend them.   ED Results / Procedures / Treatments   Labs (all labs ordered are listed, but only abnormal  results are displayed) Labs Reviewed  CBC WITH DIFFERENTIAL/PLATELET  COMPREHENSIVE METABOLIC PANEL WITH GFR  PROTIME-INR  APTT     EKG  My interpretation of EKG:  Sinus rate of 80 without any ST elevation or T wave inversions, PVC  RADIOLOGY I have reviewed the xray personally and interpreted no evidence of any fracture   PROCEDURES:  Critical Care performed: Yes, see critical care procedure note(s)  .Critical Care  Performed by: Ernest Ronal BRAVO, MD Authorized by: Ernest Ronal BRAVO, MD   Critical care provider statement:    Critical care time (minutes):  30   Critical care was necessary to treat or prevent imminent or life-threatening deterioration of the following conditions:  CNS failure or compromise   Critical care was time spent personally by me on the following activities:  Development of treatment plan with patient or surrogate, discussions with consultants, evaluation of patient's response to treatment, examination of patient, ordering and review of laboratory studies, ordering and review  of radiographic studies, ordering and performing treatments and interventions, pulse oximetry, re-evaluation of patient's condition and review of old charts    MEDICATIONS ORDERED IN ED: Medications  levETIRAcetam (KEPPRA) tablet 500 mg (has no administration in time range)  Tdap (ADACEL) injection 0.5 mL (has no administration in time range)     IMPRESSION / MDM / ASSESSMENT AND PLAN / ED COURSE  I reviewed the triage vital signs and the nursing notes.   Patient's presentation is most consistent with acute presentation with potential threat to life or bodily function.   Patient comes in for mechanical fall.  No chest pain, shortness of breath, syncopal issues currently getting treated for UTI but reports no new fevers.  She did have a little low-grade temperature but this was rechecked prior to administering Tylenol  and she had no fever.  IMPRESSION: 1. No acute facial  fracture. 2. Right facial soft tissue swelling/hematoma.  IMPRESSION: 1. Trace right temporal lobe subarachnoid hemorrhage. 2. Mild soft tissue swelling/hematoma over the right frontal bone and lateral orbit. No calvarial fracture. 3. Critical Value/emergent results were called by telephone at the time of interpretation on 06/05/2024 at 2325 hrs to provider Dr Viviann.  IMPRESSION: 1. No acute abnormality of the cervical spine.   Discussed with Dr. Claudene from neurosurgery.  Recommends Keppra 500 twice daily for 7 days.  Looks like patient's labs either hemolyzed or were canceled.  Will order the CBC, CMP, coags just to ensure no coagulopathy that would be contributing to this that would need intervention.  Patient was seen by Dr. Claudene and plan to follow-up for repeat CT head outpatient with him otherwise given repeat CT head is stable patient can be discharged back home.  X-rays are negative.  Blood work is reassuring.  Patient is ambulatory without any issues and feels comfortable with discharge back home.    The patient is on the cardiac monitor to evaluate for evidence of arrhythmia and/or significant heart rate changes.      FINAL CLINICAL IMPRESSION(S) / ED DIAGNOSES   Final diagnoses:  Fall, initial encounter  SAH (subarachnoid hemorrhage) (HCC)     Rx / DC Orders   ED Discharge Orders     None        Note:  This document was prepared using Dragon voice recognition software and may include unintentional dictation errors.   Ernest Ronal BRAVO, MD 06/06/24 [id number]    Ernest Ronal BRAVO, MD 06/06/24 [phone]

## 2024-06-06 NOTE — Telephone Encounter (Signed)
 Dr Claudene saw her as a consult on 06/06/24. She will need a follow up appointment with Lyle or Danielle around 06/20/24-07/04/24, with a repeat head CT prior to that appointment. The CT has been ordered.   Admin team, please contact the patient to schedule the follow up appointment with Lyle or Edsel and notify her of the need to have the CT done prior to this appointment. Thank you!

## 2024-06-09 NOTE — Telephone Encounter (Signed)
 Noted, I will send a message to scheduling.

## 2024-06-09 NOTE — Telephone Encounter (Signed)
 FYI - she is no longer admitted.

## 2024-06-10 NOTE — Telephone Encounter (Signed)
 Patient scheduled for CT on 06/13/2024 and appointment with Lyle Decamp 07/01/2024

## 2024-06-13 ENCOUNTER — Ambulatory Visit
Admission: RE | Admit: 2024-06-13 | Discharge: 2024-06-13 | Disposition: A | Discharge status: Home / Self Care | Source: Ambulatory Visit | Attending: Neurosurgery | Admitting: Neurosurgery

## 2024-06-13 DIAGNOSIS — I609 Nontraumatic subarachnoid hemorrhage, unspecified: Secondary | ICD-10-CM | POA: Diagnosis present

## 2024-06-26 NOTE — Progress Notes (Signed)
 Referring Physician:  No referring provider defined for this encounter.  Primary Physician:  Pcp, No  History of Present Illness: 07/01/2024 [name] is here today for follow-up on traumatic [place]arachnoid hemorrhage [place].  Unfortunately she has had headaches every day since her fall on the right side above her eye.  She has been taking Tylenol  and ibuprofen every day for this we discussed under control and overall it has improved compared to time of incident..  She denies any changes to her vision or dizziness.  She denies any new seizure activity.     Weakness: none Bowel/Bladder Dysfunction: none   Past Surgery: no spinal surgeries   Christine Mosley has no symptoms of cervical myelopathy.  The symptoms are causing a significant impact on the patient's life.   Review of Systems:  A 10 point review of systems is negative, except for the pertinent positives and negatives detailed in the HPI.  Past Medical History: Past Medical History:  Diagnosis Date   Breast cancer (HCC) 2005   right breast-lumpectomy, chemo. 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   Family history of lung cancer    Family history of prostate cancer    Family history of skin cancer    Personal history of chemotherapy    Personal history of radiation therapy     Past Surgical History: Past Surgical History:  Procedure Laterality Date   ABDOMINAL HYSTERECTOMY     BREAST BIOPSY Left 04/06/2020   BREAST LUMPECTOMY Right 2005   BREAST LUMPECTOMY Left 04/27/2020   BREAST LUMPECTOMY WITH RADIOACTIVE SEED AND SENTINEL LYMPH NODE BIOPSY Left 04/27/2020   Procedure: LEFT BREAST LUMPECTOMY WITH RADIOACTIVE SEED AND LEFT AXILLARY SENTINEL LYMPH NODE BIOPSY;  Surgeon: Ebbie Cough, MD;  Location: [facility];  Service: General;  Laterality: Left;   SHOULDER SURGERY      Allergies: Allergies as of 07/01/2024 - Review  Complete 07/01/2024  Allergen Reaction Noted   Codeine Nausea And Vomiting 04/14/2020    Medications: Outpatient Encounter Medications as of 07/01/2024  Medication Sig   acetaminophen  (TYLENOL ) 325 MG tablet Take 650 mg by mouth every 6 (six) hours as needed.   cholecalciferol (VITAMIN D3) 25 MCG (1000 UNIT) tablet Take 1 tablet (1,000 Units total) by mouth daily.   lidocaine  (LIDODERM ) 5 % Place 1 patch onto the skin daily. Remove & Discard patch within 12 hours or as directed by MD   tamoxifen  (NOLVADEX ) 20 MG tablet TAKE 1 TABLET BY MOUTH EVERY DAY   [DISCONTINUED] levETIRAcetam (KEPPRA) 500 MG tablet Take 1 tablet (500 mg total) by mouth 2 (two) times daily for 7 days.   No facility-administered encounter medications on file as of 07/01/2024.    Social History: Social History   Tobacco Use   Smoking status: Never   Smokeless tobacco: Never  Vaping Use   Vaping status: Never Used  Substance Use Topics   Alcohol use: Never   Drug use: Never    Family Medical History: Family History  Problem Relation Age of Onset   Congestive Heart Failure Mother    Prostate cancer Father 66       metastatic   Lung cancer Sister 101   Liver cancer Maternal Aunt 17   Skin cancer Sister        multiple   Cancer Maternal Aunt        unknown type, dx. in  her 63s   Lung cancer Cousin        paternal first cousin   Cancer Cousin        mouth cancer; paternal first cousin   Cancer Cousin        nose cancer; paternal first cousin    Physical Examination: @VITALWITHPAIN @  General: Patient is well developed, well nourished, calm, collected, and in no apparent distress. Attention to examination is appropriate.  Psychiatric: Patient is non-anxious.  Head:  Pupils equal, round, and reactive to light.  ENT:  Oral mucosa appears well hydrated.  Neck:   Supple.  Full range of motion.  Respiratory: Patient is breathing without any difficulty.  Extremities: No  edema.  Vascular: Palpable dorsal pedal pulses.  Skin:   On exposed skin, there are no abnormal skin lesions.  NEUROLOGICAL:     Awake, alert, oriented to person, place, and time.  Speech is clear and fluent. Fund of knowledge is appropriate.   Cranial Nerves: Pupils equal round and reactive to light.  Facial tone is symmetric.  No pronator drift.  Patient still does have small contusion above her right eyebrow.    Strength: Patient has right shoulder weakness at baseline.  Otherwise she is at full strength to baseline.  She is normoreflexic.  Gait is normal.    Medical Decision Making  Imaging: EXAM: CT HEAD WITHOUT CONTRAST 06/13/2024 12:00:07 PM   TECHNIQUE: CT of the head was performed without the administration of intravenous contrast. Automated exposure control, iterative reconstruction, and/or weight based adjustment of the mA/kV was utilized to reduce the radiation dose to as low as reasonably achievable.   COMPARISON: 06/06/2024   CLINICAL HISTORY: Subarachnoid hemorrhage [facility]).   FINDINGS:   BRAIN AND VENTRICLES: Resolution of trace right temporal subarachnoid hemorrhage. No evidence of acute infarct. No hydrocephalus. No extra-axial collection. No mass effect or midline shift.   ORBITS: Right periorbital soft tissue swelling and hematoma.   SINUSES: No acute abnormality.   SOFT TISSUES AND SKULL: No acute soft tissue abnormality. No skull fracture.   IMPRESSION: 1. Resolution of right temporal subarachnoid hemorrhage. 2. Right periorbital soft tissue swelling and hematoma.  I have personally reviewed the images and agree with the above interpretation.  Assessment and Plan: Christine Mosley is a pleasant 68 y.o. female is here today for follow-up on traumatic [place]arachnoid hemorrhage [place].  Unfortunately she has had headaches every day since her fall on the right side above her eye.  She has been taking Tylenol   and ibuprofen every day for this we discussed under control and overall it has improved compared to time of incident..  She denies any changes to her vision or dizziness.  She denies any new seizure activity.  On examination, no pronator drift.  Pupils are equal, round, reactive to light.  Examination is to baseline.  CT scan was reviewed which does show resolution of right temporal subarachnoid hemorrhage.  She continues to have the right periorbital soft tissue swelling and hematoma.  At this point she has stopped Keppra.  Since her CT scan was so well-appearing, no need for further imaging at this time.  Instructed the patient and her husband that if she were to have any new or changing symptoms to go to the nearest emergency department.  Red flag symptoms were reviewed including worsening headache, seizures, changes to vision, changes to gait, lethargy.  Thank you for involving me in the care of this patient.  Lyle Decamp, PA-C Dept. of Neurosurgery

## 2024-07-01 ENCOUNTER — Ambulatory Visit: Admitting: Physician Assistant

## 2024-07-01 ENCOUNTER — Encounter: Payer: Self-pay | Admitting: Physician Assistant

## 2024-07-01 VITALS — BP 112/70 | Ht 64.0 in | Wt 164.0 lb

## 2024-07-01 DIAGNOSIS — W19XXXD Unspecified fall, subsequent encounter: Secondary | ICD-10-CM | POA: Diagnosis not present

## 2024-07-01 DIAGNOSIS — I609 Nontraumatic subarachnoid hemorrhage, unspecified: Secondary | ICD-10-CM

## 2024-07-01 DIAGNOSIS — S066XAD Traumatic subarachnoid hemorrhage with loss of consciousness status unknown, subsequent encounter: Secondary | ICD-10-CM

## 2024-07-02 ENCOUNTER — Telehealth: Payer: Self-pay | Admitting: Physician Assistant

## 2024-07-02 NOTE — Telephone Encounter (Signed)
 Patient is calling to see if Lyle has had a chance to review with Dr. Claudene on if the patient is cleared to be scheduled for her shoulder surgery. Please advise.

## 2024-07-30 NOTE — Patient Instructions (Signed)
 SURGICAL WAITING ROOM VISITATION Patients having surgery or a procedure may have no more than 2 support people in the waiting area - these visitors may rotate in the visitor waiting room.   Due to an increase in RSV and influenza rates and associated hospitalizations, children ages 67 and under may not visit patients in [facility] hospitals. If the patient needs to stay at the hospital during part of their recovery, the visitor guidelines for inpatient rooms apply.  PRE-OP VISITATION  Pre-op nurse will coordinate an appropriate time for 1 support person to accompany the patient in pre-op.  This support person may not rotate.  This visitor will be contacted when the time is appropriate for the visitor to come back in the pre-op area.  Please refer to the [facility] website for the visitor guidelines for Inpatients (after your surgery is over and you are in a regular room).  You are not required to quarantine at this time prior to your surgery. However, you must do this: Hand Hygiene often Do NOT share personal items Notify your provider if you are in close contact with someone who has COVID or you develop fever 100.4 or greater, new onset of sneezing, cough, sore throat, shortness of breath or body aches.  If you test positive for Covid or have been in contact with anyone that has tested positive in the last 10 days please notify you surgeon.    Your procedure is scheduled on: 08/08/24   Report to [facility] Main Entrance: [place] entrance where the [employer] is available.   Report to admitting at: 2:00 PM  Call this number if you have any questions or problems the morning of surgery [phone]  FOLLOW ANY ADDITIONAL PRE OP INSTRUCTIONS YOU RECEIVED FROM YOUR SURGEON'S OFFICE!!!  Do not eat food after Midnight the night prior to your surgery/procedure.  After Midnight you may have the following liquids until: 1:00 PM DAY OF SURGERY  Clear Liquid Diet Water Black  Coffee (sugar ok, NO MILK/CREAM OR CREAMERS)  Tea (sugar ok, NO MILK/CREAM OR CREAMERS) regular and decaf                             Plain Jell-O  with no fruit (NO RED)                                           Fruit ices (not with fruit pulp, NO RED)                                     Popsicles (NO RED)                                                                  Juice: NO CITRUS JUICES: only apple, WHITE grape, WHITE cranberry Sports drinks like Gatorade or Powerade (NO RED)   The day of surgery:  Drink ONE (1) Pre-Surgery Clear Ensure at : 1:00 PM the morning of surgery. Drink in one sitting. Do not sip.  This drink was given  to you during your hospital pre-op appointment visit. Nothing else to drink after completing the Pre-Surgery Clear Ensure or G2 : No candy, chewing gum or throat lozenges.    Oral Hygiene is also important to reduce your risk of infection.        Remember - BRUSH YOUR TEETH THE MORNING OF SURGERY WITH YOUR REGULAR TOOTHPASTE  Do NOT smoke after Midnight the night before surgery.  STOP TAKING all Vitamins, Herbs and supplements 1 week before your surgery.   Take ONLY these medicines the morning of surgery with A SIP OF WATER: tamoxifen                    You may not have any metal on your body including hair pins, jewelry, and body piercing  Do not wear make-up, lotions, powders, perfumes / cologne, or deodorant  Do not wear nail polish including gel and S&S, artificial / acrylic nails, or any other type of covering on natural nails including finger and toenails. If you have artificial nails, gel coating, etc., that needs to be removed by a nail salon, Please have this removed prior to surgery. Not doing so may mean that your surgery could be cancelled or delayed if the Surgeon or anesthesia staff feels like they are unable to monitor you safely.   Do not shave 48 hours prior to surgery to avoid nicks in your skin which may contribute to postoperative  infections.   Contacts, Hearing Aids, dentures or bridgework may not be worn into surgery. DENTURES WILL BE REMOVED PRIOR TO SURGERY PLEASE DO NOT APPLY Poly grip OR ADHESIVES!!!  You may bring a small overnight bag with you on the day of surgery, only pack items that are not valuable. [facility] IS NOT RESPONSIBLE   FOR VALUABLES THAT ARE LOST OR STOLEN.   Patients discharged on the day of surgery will not be allowed to drive home.  Someone NEEDS to stay with you for the first 24 hours after anesthesia.  Do not bring your home medications to the hospital. The Pharmacy will dispense medications listed on your medication list to you during your admission in the Hospital.  Special Instructions: Bring a copy of your healthcare power of attorney and living will documents the day of surgery, if you wish to have them scanned into your [facility]- EPIC  Please read over the following fact sheets you were given: IF YOU HAVE QUESTIONS ABOUT YOUR PRE-OP INSTRUCTIONS, PLEASE CALL [phone]   [facility] - Preparing for Surgery      Before surgery, you can play an important role.  Because skin is not sterile, your skin needs to be as free of germs as possible.  You can reduce the number of germs on your skin by washing with CHG (chlorahexidine gluconate) soap before surgery.  CHG is an antiseptic cleaner which kills germs and bonds with the skin to continue killing germs even after washing. Please DO NOT use if you have an allergy to CHG or antibacterial soaps.  If your skin becomes reddened/irritated stop using the CHG and inform your nurse when you arrive at Short Stay. Do not shave (including legs and underarms) for at least 48 hours prior to the first CHG shower.  You may shave your face/neck.  Please follow these instructions carefully:  1.  Shower with CHG Soap the night before surgery ONLY (DO NOT USE THE SOAP THE MORNING OF SURGERY).  2.  If you choose to wash your  hair, wash  your hair first as usual with your normal  shampoo.  3.  After you shampoo, rinse your hair and body thoroughly to remove the shampoo.                             4.  Use CHG as you would any other liquid soap.  You can apply chg directly to the skin and wash.  Gently with a scrungie or clean washcloth.  5.  Apply the CHG Soap to your body ONLY FROM THE NECK DOWN.   Do not use on face/ open                           Wound or open sores. Avoid contact with eyes, ears mouth and genitals (private parts).                       Wash face,  Genitals (private parts) with your normal soap.             6.  Wash thoroughly, paying special attention to the area where your  surgery  will be performed.  7.  Thoroughly rinse your body with warm water from the neck down.  8.  DO NOT shower/wash with your normal soap after using and rinsing off the CHG Soap.                9.  Pat yourself dry with a clean towel.            10.  Wear clean pajamas.            11.  Place clean sheets on your bed the night of your first shower and do not  sleep with pets.  Day of Surgery : Do not apply any CHG, lotions/deodorants the morning of surgery.  Please wear clean clothes to the hospital/surgery center.   FAILURE TO FOLLOW THESE INSTRUCTIONS MAY RESULT IN THE CANCELLATION OF YOUR SURGERY  PATIENT SIGNATURE_________________________________  NURSE SIGNATURE__________________________________  ________________________________________________________________________  Nasario Exon  An incentive spirometer is a tool that can help keep your lungs clear and active. This tool measures how well you are filling your lungs with each breath. Taking long deep breaths may help reverse or decrease the chance of developing breathing (pulmonary) problems (especially infection) following: A long period of time when you are unable to move or be active. BEFORE THE PROCEDURE  If the spirometer includes an indicator to show your  best effort, your nurse or respiratory therapist will set it to a desired goal. If possible, sit up straight or lean slightly forward. Try not to slouch. Hold the incentive spirometer in an upright position. INSTRUCTIONS FOR USE  Sit on the edge of your bed if possible, or sit up as far as you can in bed or on a chair. Hold the incentive spirometer in an upright position. Breathe out normally. Place the mouthpiece in your mouth and seal your lips tightly around it. Breathe in slowly and as deeply as possible, raising the piston or the ball toward the top of the column. Hold your breath for 3-5 seconds or for as long as possible. Allow the piston or ball to fall to the bottom of the column. Remove the mouthpiece from your mouth and breathe out normally. Rest for a few seconds and repeat Steps 1 through 7 at least 10 times every 1-2  hours when you are awake. Take your time and take a few normal breaths between deep breaths. The spirometer may include an indicator to show your best effort. Use the indicator as a goal to work toward during each repetition. After each set of 10 deep breaths, practice coughing to be sure your lungs are clear. If you have an incision (the cut made at the time of surgery), support your incision when coughing by placing a pillow or rolled up towels firmly against it. Once you are able to get out of bed, walk around indoors and cough well. You may stop using the incentive spirometer when instructed by your caregiver.  RISKS AND COMPLICATIONS Take your time so you do not get dizzy or light-headed. If you are in pain, you may need to take or ask for pain medication before doing incentive spirometry. It is harder to take a deep breath if you are having pain. AFTER USE Rest and breathe slowly and easily. It can be helpful to keep track of a log of your progress. Your caregiver can provide you with a simple table to help with this. If you are using the spirometer at home,  follow these instructions: SEEK MEDICAL CARE IF:  You are having difficultly using the spirometer. You have trouble using the spirometer as often as instructed. Your pain medication is not giving enough relief while using the spirometer. You develop fever of 100.5 F (38.1 C) or higher. SEEK IMMEDIATE MEDICAL CARE IF:  You cough up bloody sputum that had not been present before. You develop fever of 102 F (38.9 C) or greater. You develop worsening pain at or near the incision site. MAKE SURE YOU:  Understand these instructions. Will watch your condition. Will get help right away if you are not doing well or get worse. Document Released: 12/18/2006 Document Revised: 10/30/2011 Document Reviewed: 02/18/2007 [facility] Patient Information 2014 [place], [place].   ________________________________________________________________________

## 2024-07-31 ENCOUNTER — Encounter (HOSPITAL_COMMUNITY): Admission: RE | Admit: 2024-07-31 | Discharge: 2024-07-31 | Attending: Orthopedic Surgery

## 2024-07-31 ENCOUNTER — Encounter (HOSPITAL_COMMUNITY): Payer: Self-pay

## 2024-07-31 ENCOUNTER — Other Ambulatory Visit: Payer: Self-pay

## 2024-07-31 VITALS — BP 125/73 | HR 93 | Temp 98.7°F | Ht 64.0 in | Wt 165.0 lb

## 2024-07-31 DIAGNOSIS — Z01818 Encounter for other preprocedural examination: Secondary | ICD-10-CM

## 2024-07-31 LAB — BASIC METABOLIC PANEL WITH GFR
Anion gap: 13 (ref 5–15)
BUN: 9 mg/dL (ref 8–23)
CO2: 22 mmol/L (ref 22–32)
Calcium: 10 mg/dL (ref 8.9–10.3)
Chloride: 105 mmol/L (ref 98–111)
Creatinine, Ser: 0.76 mg/dL (ref 0.44–1.00)
GFR, Estimated: >60 mL/min (ref >60)
Glucose, Bld: 165 mg/dL — ABNORMAL HIGH (ref 70–99)
Potassium: 4 mmol/L (ref 3.5–5.1)
Sodium: 140 mmol/L (ref 135–145)

## 2024-07-31 LAB — CBC
HCT: 41.3 % (ref 36.0–46.0)
Hemoglobin: 13.9 g/dL (ref 12.0–15.0)
MCH: 29.3 pg (ref 26.0–34.0)
MCHC: 33.7 g/dL (ref 30.0–36.0)
MCV: 86.9 fL (ref 80.0–100.0)
Platelets: 269 K/uL (ref 150–400)
RBC: 4.75 MIL/uL (ref 3.87–5.11)
RDW: 12.7 % (ref 11.5–15.5)
WBC: 8 K/uL (ref 4.0–10.5)
nRBC: 0 % (ref 0.0–0.2)

## 2024-07-31 NOTE — Progress Notes (Addendum)
 For Anesthesia: PCP - NO PCP Cardiologist - N/A  Bowel Prep reminder:  Chest x-ray -  EKG - 06/05/24 Stress Test -  ECHO -  Cardiac Cath -  Pacemaker/ICD device last checked: Pacemaker orders received: Device Rep notified:  Spinal Cord Stimulator:N/A Sleep Study -  CPAP -   Fasting Blood Sugar - N/A Checks Blood Sugar _____ times a day Date and result of last Hgb A1c-7.7: 05/26/24  Last dose of GLP1 agonist- N/A GLP1 instructions: Hold 7 days prior to schedule (Hold 24 hours-daily)   Last dose of SGLT-2 inhibitors- N/A SGLT-2 instructions: Hold 72 hours prior to surgery  Blood Thinner Instructions:N/A Last Dose: Time last taken:  Aspirin Instructions:N/A Last Dose: Time last taken:  Activity level: Can go up a flight of stairs and activities of daily living without stopping and without chest pain and/or shortness of breath   Able to exercise without chest pain and/or shortness of breath  Anesthesia review: Subarachnoid hemorrhage on 06/06/24 after a fall.  Patient denies shortness of breath, fever, cough and chest pain at PAT appointment   Patient verbalized understanding of instructions that were reviewed over the telephone.

## 2024-08-01 NOTE — Progress Notes (Signed)
 Case: [id number] Date/Time: 08/08/24 1600   Procedures:      ARTHROSCOPY, SHOULDER WITH DEBRIDEMENT (Right) - Right shoulder arthroscopy with debridement, subacromial decompression and capsule release     DECOMPRESSION, SUBACROMIAL SPACE (Right)   Anesthesia type: Choice   Pre-op diagnosis: Right shoulder osteoarthritis, impingement   Location: [facility] ROOM 07 / [facility]   Surgeons: Sharl Selinda Dover, MD       DISCUSSION: Christine Mosley is a 68 yo female with PMH of breast cancer s/p lumpectomy, chemo, XRT, arthritis, traumatic SAH, DM (A1c 7.7)  Patient had a mechanical fall  on 10/17 and CT head showed trace SAH in the right temporal lobe. Neurosurgery consulted and patient was started on Keppra  and advised to f/u for repeat CT head. She was seen on 11/11 for follow up. Repeat CT head showed hemorrhage had resolved. Keppra  was discontinued. NSU clearance scanned in media on 11/11 that patient is ok to proceed.  Patient's glucose is elevated at pre op. Her A1c was 7.7 on 10/6. It does not appear she has a diagnosis of diabetes and she is not on meds. Patient does not currently have PCP listed. A1c results routed to Dr. Sharl.  VS: BP 125/73   Pulse 93   Temp 37.1 C (Oral)   Ht 5' 4 (1.626 m)   Wt 74.8 kg   SpO2 97%   BMI 28.32 kg/m   PROVIDERS: Pcp, No   LABS: Labs reviewed: Acceptable for surgery. (all labs ordered are listed, but only abnormal results are displayed)  Labs Reviewed  BASIC METABOLIC PANEL WITH GFR - Abnormal; Notable for the following components:      Result Value   Glucose, Bld 165 (*)    All other components within normal limits  CBC     CT head 06/13/24  IMPRESSION: 1. Resolution of right temporal subarachnoid hemorrhage. 2. Right periorbital soft tissue swelling and hematoma.    EKG 06/05/24:  Sinus rhythm with occasional PVCs Cannot rule out anterior infarct, age undetermined  CV:  Past Medical History:  Diagnosis Date   Arthritis     Breast cancer (HCC) 2005   right breast-lumpectomy, chemo. 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   Dyspnea    Family history of lung cancer    Family history of prostate cancer    Family history of skin cancer    Personal history of chemotherapy    Personal history of radiation therapy     Past Surgical History:  Procedure Laterality Date   ABDOMINAL HYSTERECTOMY     BLADDER SURGERY     BREAST BIOPSY Left 04/06/2020   BREAST LUMPECTOMY Right 2005   BREAST LUMPECTOMY Left 04/27/2020   BREAST LUMPECTOMY WITH RADIOACTIVE SEED AND SENTINEL LYMPH NODE BIOPSY Left 04/27/2020   Procedure: LEFT BREAST LUMPECTOMY WITH RADIOACTIVE SEED AND LEFT AXILLARY SENTINEL LYMPH NODE BIOPSY;  Surgeon: Ebbie Cough, MD;  Location: [facility];  Service: General;  Laterality: Left;   CESAREAN SECTION     x 1   SHOULDER SURGERY Left     MEDICATIONS:  acetaminophen  (TYLENOL ) 325 MG tablet   cholecalciferol (VITAMIN D3) 25 MCG (1000 UNIT) tablet   lidocaine  (LIDODERM ) 5 %   tamoxifen  (NOLVADEX ) 20 MG tablet   No current facility-administered medications for this encounter.   Burnard CHRISTELLA Odis DEVONNA [facility] Short Stay/Anesthesiology [facility] Phone [phone] 08/01/2024 7:47 PM

## 2024-08-04 NOTE — Anesthesia Preprocedure Evaluation (Signed)
 Anesthesia Evaluation    Airway        Dental   Pulmonary           Cardiovascular      Neuro/Psych    GI/Hepatic   Endo/Other    Renal/GU      Musculoskeletal   Abdominal   Peds  Hematology   Anesthesia Other Findings   Reproductive/Obstetrics                              Anesthesia Physical Anesthesia Plan  ASA:   Anesthesia Plan:    Post-op Pain Management:    Induction:   PONV Risk Score and Plan:   Airway Management Planned:   Additional Equipment:   Intra-op Plan:   Post-operative Plan:   Informed Consent:   Plan Discussed with:   Anesthesia Plan Comments: (See PAT note from 12/11)         Anesthesia Quick Evaluation

## 2024-08-07 ENCOUNTER — Encounter (HOSPITAL_BASED_OUTPATIENT_CLINIC_OR_DEPARTMENT_OTHER): Payer: Self-pay | Admitting: Orthopedic Surgery

## 2024-08-07 ENCOUNTER — Other Ambulatory Visit: Payer: Self-pay

## 2024-08-08 ENCOUNTER — Other Ambulatory Visit: Payer: Self-pay

## 2024-08-08 ENCOUNTER — Encounter (HOSPITAL_BASED_OUTPATIENT_CLINIC_OR_DEPARTMENT_OTHER): Payer: Self-pay | Admitting: Orthopedic Surgery

## 2024-08-08 ENCOUNTER — Ambulatory Visit (HOSPITAL_BASED_OUTPATIENT_CLINIC_OR_DEPARTMENT_OTHER)
Admission: RE | Admit: 2024-08-08 | Discharge: 2024-08-08 | Disposition: A | Discharge status: Home / Self Care | Attending: Orthopedic Surgery | Admitting: Orthopedic Surgery

## 2024-08-08 ENCOUNTER — Encounter: Admission: RE | Disposition: A | Payer: Self-pay | Attending: Orthopedic Surgery

## 2024-08-08 ENCOUNTER — Ambulatory Visit (HOSPITAL_COMMUNITY): Payer: Self-pay | Admitting: Medical

## 2024-08-08 ENCOUNTER — Encounter (HOSPITAL_COMMUNITY): Payer: Self-pay | Admitting: Medical

## 2024-08-08 DIAGNOSIS — Z853 Personal history of malignant neoplasm of breast: Secondary | ICD-10-CM | POA: Diagnosis not present

## 2024-08-08 DIAGNOSIS — M19011 Primary osteoarthritis, right shoulder: Secondary | ICD-10-CM | POA: Diagnosis not present

## 2024-08-08 DIAGNOSIS — M25811 Other specified joint disorders, right shoulder: Secondary | ICD-10-CM | POA: Insufficient documentation

## 2024-08-08 DIAGNOSIS — M7541 Impingement syndrome of right shoulder: Secondary | ICD-10-CM | POA: Diagnosis not present

## 2024-08-08 DIAGNOSIS — E119 Type 2 diabetes mellitus without complications: Secondary | ICD-10-CM | POA: Insufficient documentation

## 2024-08-08 DIAGNOSIS — M7501 Adhesive capsulitis of right shoulder: Secondary | ICD-10-CM | POA: Diagnosis not present

## 2024-08-08 DIAGNOSIS — Z01818 Encounter for other preprocedural examination: Secondary | ICD-10-CM

## 2024-08-08 DIAGNOSIS — M7521 Bicipital tendinitis, right shoulder: Secondary | ICD-10-CM | POA: Insufficient documentation

## 2024-08-08 HISTORY — PX: SUBACROMIAL DECOMPRESSION: SHX5174

## 2024-08-08 HISTORY — PX: POSTERIOR LUMBAR FUSION 2 WITH HARDWARE REMOVAL: SHX7297

## 2024-08-08 SURGERY — ARTHROSCOPY, SHOULDER WITH DEBRIDEMENT
Anesthesia: General | Site: Shoulder | Laterality: Right

## 2024-08-08 MED ORDER — SUGAMMADEX SODIUM 200 MG/2ML IV SOLN
INTRAVENOUS | Status: DC | PRN
  Administered 2024-08-08: 200 mg via INTRAVENOUS

## 2024-08-08 MED ORDER — LIDOCAINE 2% (20 MG/ML) 5 ML SYRINGE
INTRAMUSCULAR | Status: AC
  Filled 2024-08-08: qty 5

## 2024-08-08 MED ORDER — HYDROCODONE-ACETAMINOPHEN 7.5-325 MG PO TABS: 1.0000 | ORAL_TABLET | Freq: Four times a day (QID) | ORAL | 0 refills | Status: AC | PRN

## 2024-08-08 MED ORDER — CEFAZOLIN SODIUM-DEXTROSE 2-4 GM/100ML-% IV SOLN
2.0000 g | INTRAVENOUS | Status: AC
  Administered 2024-08-08: 2 g via INTRAVENOUS

## 2024-08-08 MED ORDER — ROCURONIUM BROMIDE 100 MG/10ML IV SOLN
INTRAVENOUS | Status: DC | PRN
  Administered 2024-08-08: 50 mg via INTRAVENOUS

## 2024-08-08 MED ORDER — DEXMEDETOMIDINE HCL IN NACL 80 MCG/20ML IV SOLN
INTRAVENOUS | Status: AC
  Filled 2024-08-08: qty 20

## 2024-08-08 MED ORDER — PROPOFOL 10 MG/ML IV BOLUS
INTRAVENOUS | Status: DC | PRN
  Administered 2024-08-08: 130 mg via INTRAVENOUS

## 2024-08-08 MED ORDER — KETOROLAC TROMETHAMINE 30 MG/ML IJ SOLN
INTRAMUSCULAR | Status: AC
  Filled 2024-08-08: qty 1

## 2024-08-08 MED ORDER — BUPIVACAINE LIPOSOME 1.3 % IJ SUSP
INTRAMUSCULAR | Status: DC | PRN
  Administered 2024-08-08: 10 mL via PERINEURAL

## 2024-08-08 MED ORDER — FENTANYL CITRATE (PF) 100 MCG/2ML IJ SOLN: 25.0000 ug | INTRAMUSCULAR | Status: DC | PRN

## 2024-08-08 MED ORDER — CEFAZOLIN SODIUM-DEXTROSE 2-4 GM/100ML-% IV SOLN
INTRAVENOUS | Status: AC
  Filled 2024-08-08: qty 100

## 2024-08-08 MED ORDER — MIDAZOLAM HCL 2 MG/2ML IJ SOLN
INTRAMUSCULAR | Status: AC
  Filled 2024-08-08: qty 2

## 2024-08-08 MED ORDER — LIDOCAINE HCL (CARDIAC) PF 100 MG/5ML IV SOSY
PREFILLED_SYRINGE | INTRAVENOUS | Status: DC | PRN
  Administered 2024-08-08: 60 mg via INTRAVENOUS

## 2024-08-08 MED ORDER — LACTATED RINGERS IV SOLN: INTRAVENOUS | Status: DC

## 2024-08-08 MED ORDER — DEXAMETHASONE SODIUM PHOSPHATE 4 MG/ML IJ SOLN
INTRAMUSCULAR | Status: DC | PRN
  Administered 2024-08-08: 5 mg via INTRAVENOUS

## 2024-08-08 MED ORDER — FENTANYL CITRATE (PF) 100 MCG/2ML IJ SOLN
100.0000 ug | Freq: Once | INTRAMUSCULAR | Status: AC
  Administered 2024-08-08: 50 ug via INTRAVENOUS

## 2024-08-08 MED ORDER — PHENYLEPHRINE 80 MCG/ML (10ML) SYRINGE FOR IV PUSH (FOR BLOOD PRESSURE SUPPORT)
PREFILLED_SYRINGE | INTRAVENOUS | Status: AC
  Filled 2024-08-08: qty 20

## 2024-08-08 MED ORDER — ONDANSETRON HCL 4 MG/2ML IJ SOLN
INTRAMUSCULAR | Status: DC | PRN
  Administered 2024-08-08: 4 mg via INTRAVENOUS

## 2024-08-08 MED ORDER — EPHEDRINE 5 MG/ML INJ
INTRAVENOUS | Status: AC
  Filled 2024-08-08: qty 5

## 2024-08-08 MED ORDER — ONDANSETRON HCL 4 MG PO TABS: 4.0000 mg | ORAL_TABLET | Freq: Three times a day (TID) | ORAL | 0 refills | Status: AC | PRN

## 2024-08-08 MED ORDER — ROCURONIUM BROMIDE 10 MG/ML (PF) SYRINGE
PREFILLED_SYRINGE | INTRAVENOUS | Status: AC
  Filled 2024-08-08: qty 10

## 2024-08-08 MED ORDER — SODIUM CHLORIDE 0.9 % IR SOLN
Status: DC | PRN
  Administered 2024-08-08: 3000 mL

## 2024-08-08 MED ORDER — BUPIVACAINE HCL (PF) 0.5 % IJ SOLN
INTRAMUSCULAR | Status: DC | PRN
  Administered 2024-08-08: 10 mL via PERINEURAL

## 2024-08-08 MED ORDER — BUPIVACAINE LIPOSOME 1.3 % IJ SUSP
INTRAMUSCULAR | Status: AC
  Filled 2024-08-08: qty 10

## 2024-08-08 MED ORDER — FENTANYL CITRATE (PF) 100 MCG/2ML IJ SOLN
INTRAMUSCULAR | Status: AC
  Filled 2024-08-08: qty 2

## 2024-08-08 MED ORDER — ONDANSETRON HCL 4 MG/2ML IJ SOLN: 4.0000 mg | Freq: Once | INTRAMUSCULAR | Status: DC | PRN

## 2024-08-08 MED ORDER — MIDAZOLAM HCL (PF) 2 MG/2ML IJ SOLN
2.0000 mg | Freq: Once | INTRAMUSCULAR | Status: AC
  Administered 2024-08-08: 2 mg via INTRAVENOUS

## 2024-08-08 MED ORDER — ACETAMINOPHEN 500 MG PO TABS
1000.0000 mg | ORAL_TABLET | Freq: Once | ORAL | Status: AC
  Administered 2024-08-08: 1000 mg via ORAL

## 2024-08-08 MED ORDER — FENTANYL CITRATE (PF) 100 MCG/2ML IJ SOLN
INTRAMUSCULAR | Status: DC | PRN
  Administered 2024-08-08: 25 ug via INTRAVENOUS

## 2024-08-08 SURGICAL SUPPLY — 46 items
BNDG COHESIVE 4X5 TAN STRL LF (GAUZE/BANDAGES/DRESSINGS) IMPLANT
BURR OVAL 8 FLU 4.0X13 (MISCELLANEOUS) IMPLANT
CANNULA 5.75X71 LONG (CANNULA) IMPLANT
CANNULA TWIST IN 8.25X7CM (CANNULA) IMPLANT
COOLER ICEMAN CLASSIC (MISCELLANEOUS) ×1 IMPLANT
CUTTER BONE 4.0MM X 13CM (MISCELLANEOUS) ×1 IMPLANT
DRAPE INCISE IOBAN 66X45 STRL (DRAPES) ×1 IMPLANT
DRAPE STERI 35X30 U-POUCH (DRAPES) ×1 IMPLANT
DRAPE SURG 17X23 STRL (DRAPES) ×1 IMPLANT
DRAPE U-SHAPE 47X51 STRL (DRAPES) ×1 IMPLANT
DRAPE U-SHAPE 76X120 STRL (DRAPES) ×2 IMPLANT
DURAPREP 26ML APPLICATOR (WOUND CARE) ×1 IMPLANT
GAUZE PAD ABD 8X10 STRL (GAUZE/BANDAGES/DRESSINGS) ×2 IMPLANT
GAUZE SPONGE 4X4 12PLY STRL (GAUZE/BANDAGES/DRESSINGS) ×1 IMPLANT
GAUZE XEROFORM 1X8 LF (GAUZE/BANDAGES/DRESSINGS) IMPLANT
GLOVE BIO SURGEON STRL SZ7.5 (GLOVE) ×2 IMPLANT
GLOVE BIOGEL PI IND STRL 8 (GLOVE) ×2 IMPLANT
GOWN STRL REUS W/ TWL LRG LVL3 (GOWN DISPOSABLE) ×1 IMPLANT
GOWN STRL REUS W/TWL XL LVL3 (GOWN DISPOSABLE) ×2 IMPLANT
LOOP 2 FIBERLINK CLOSED (SUTURE) IMPLANT
MANIFOLD NEPTUNE II (INSTRUMENTS) ×1 IMPLANT
NDL HD SCORPION MEGA LOADER (NEEDLE) IMPLANT
NDL SAFETY ECLIPSE 18X1.5 (NEEDLE) ×1 IMPLANT
PACK ARTHROSCOPY DSU (CUSTOM PROCEDURE TRAY) ×1 IMPLANT
PACK BASIN DAY SURGERY FS (CUSTOM PROCEDURE TRAY) ×1 IMPLANT
PAD COLD SHLDR WRAP-ON (PAD) ×1 IMPLANT
SLEEVE ARM SUSPENSION SYSTEM (MISCELLANEOUS) ×1 IMPLANT
SLEEVE SCD COMPRESS KNEE MED (STOCKING) ×1 IMPLANT
SLING ARM FOAM STRAP LRG (SOFTGOODS) IMPLANT
SLING ARM FOAM STRAP MED (SOFTGOODS) IMPLANT
SLING S3 LATERAL DISP (MISCELLANEOUS) IMPLANT
SLING SHLDR PAD UNIV <17 (SOFTGOODS) IMPLANT
SPIKE FLUID TRANSFER (MISCELLANEOUS) IMPLANT
STRIP CLOSURE SKIN 1/2X4 (GAUZE/BANDAGES/DRESSINGS) ×1 IMPLANT
SUT ETHILON 3 0 PS 1 (SUTURE) IMPLANT
SUT MNCRL AB 3-0 PS2 27 (SUTURE) ×1 IMPLANT
SUT PDS AB 0 CT 36 (SUTURE) IMPLANT
SUT VIC AB 0 CT1 36 (SUTURE) IMPLANT
SUT VIC AB 2-0 CT1 TAPERPNT 27 (SUTURE) IMPLANT
SUTURE TAPE TIGERLINK 1.3MM BL (SUTURE) IMPLANT
SYR 5ML LL (SYRINGE) ×1 IMPLANT
TAPE FIBER 2MM 7IN #2 BLUE (SUTURE) IMPLANT
TOWEL GREEN STERILE FF (TOWEL DISPOSABLE) ×1 IMPLANT
TUBE CONNECTING 20X1/4 (TUBING) ×1 IMPLANT
TUBING ARTHROSCOPY IRRIG 16FT (MISCELLANEOUS) ×1 IMPLANT
WAND ABLATOR APOLLO I90 (BUR) ×1 IMPLANT

## 2024-08-08 NOTE — Progress Notes (Signed)
Assisted Dr. Turk with right, interscalene , ultrasound guided block. Side rails up, monitors on throughout procedure. See vital signs in flow sheet. Tolerated Procedure well. 

## 2024-08-08 NOTE — Anesthesia Postprocedure Evaluation (Signed)
"   Anesthesia Post Note  Patient: Christine Mosley  Procedure(s) Performed: ARTHROSCOPY, SHOULDER WITH DEBRIDEMENT (Right: Shoulder) DECOMPRESSION, SUBACROMIAL SPACE (Right: Shoulder)     Patient location during evaluation: PACU Anesthesia Type: General Level of consciousness: awake and alert Pain management: pain level controlled Vital Signs Assessment: post-procedure vital signs reviewed and stable Respiratory status: spontaneous breathing, nonlabored ventilation and respiratory function stable Cardiovascular status: stable and blood pressure returned to baseline Anesthetic complications: no   No notable events documented.  Last Vitals:  Vitals:   08/08/24 1524 08/08/24 1530  BP:  (!) 142/90  Pulse: 77 95  Resp: (!) 25 19  Temp:    SpO2: 100% 100%    Last Pain:  Vitals:   08/08/24 1505  TempSrc:   PainSc: 0-No pain                 Debby FORBES Like      "

## 2024-08-08 NOTE — H&P (Signed)
 "  ORTHOPAEDIC H and P  REQUESTING PHYSICIAN: Sharl Christine Dover, MD  PCP:  Pcp, No  Chief Complaint: Right shoulder pain and stiffness  HPI: Christine Mosley is a 68 y.o. female who complains of progressive right shoulder stiffness and pain.  She has had recalcitrant symptoms to aggressive conservative treatment with brisement procedures, cortisone injections, and outpatient therapies.  Here today for arthroscopic lysis of adhesion with debridement as well as manipulation of the joint.  No new complaints.  Past Medical History:  Diagnosis Date   Arthritis    Breast cancer (HCC) 2005   right breast-lumpectomy, chemo. 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   Dyspnea    Family history of lung cancer    Family history of prostate cancer    Family history of skin cancer    Personal history of chemotherapy    Personal history of radiation therapy    Past Surgical History:  Procedure Laterality Date   ABDOMINAL HYSTERECTOMY     BLADDER SURGERY     BREAST BIOPSY Left 04/06/2020   BREAST LUMPECTOMY Right 2005   BREAST LUMPECTOMY Left 04/27/2020   BREAST LUMPECTOMY WITH RADIOACTIVE SEED AND SENTINEL LYMPH NODE BIOPSY Left 04/27/2020   Procedure: LEFT BREAST LUMPECTOMY WITH RADIOACTIVE SEED AND LEFT AXILLARY SENTINEL LYMPH NODE BIOPSY;  Surgeon: Ebbie Cough, MD;  Location: [facility];  Service: General;  Laterality: Left;   CESAREAN SECTION     x 1   SHOULDER SURGERY Left    Social History   Socioeconomic History   Marital status: Married    Spouse name: Not on file   Number of children: Not on file   Years of education: Not on file   Highest education level: Not on file  Occupational History   Not on file  Tobacco Use   Smoking status: Never   Smokeless tobacco: Never  Vaping Use   Vaping status: Never Used  Substance and Sexual Activity   Alcohol use: Never   Drug use: Never   Sexual activity: Not Currently    Birth  control/protection: Surgical  Other Topics Concern   Not on file  Social History Narrative   Not on file   Social Drivers of Health   Tobacco Use: Low Risk (08/08/2024)   Patient History    Smoking Tobacco Use: Never    Smokeless Tobacco Use: Never    Passive Exposure: Not on file  Financial Resource Strain: Not on file  Food Insecurity: Not on file  Transportation Needs: Not on file  Physical Activity: Not on file  Stress: Not on file  Social Connections: Not on file  Depression (EYV7-0): Not on file  Alcohol Screen: Not on file  Housing: Not on file  Utilities: Not on file  Health Literacy: Not on file   Family History  Problem Relation Age of Onset   Congestive Heart Failure Mother    Prostate cancer Father 73       metastatic   Lung cancer Sister 62   Liver cancer Maternal Aunt 58   Skin cancer Sister        multiple   Cancer Maternal Aunt        unknown type, dx. in her 54s   Lung cancer Cousin        paternal first cousin   Cancer Cousin        mouth cancer; paternal first cousin   Cancer Cousin  nose cancer; paternal first cousin   Allergies[1] Prior to Admission medications  Medication Sig Start Date End Date Taking? Authorizing Provider  acetaminophen  (TYLENOL ) 325 MG tablet Take 650 mg by mouth every 6 (six) hours as needed for moderate pain (pain score 4-6).   Yes [provider]  cholecalciferol (VITAMIN D3) 25 MCG (1000 UNIT) tablet Take 1 tablet (1,000 Units total) by mouth daily. 07/26/20  Yes Magrinat, Sandria BROCKS, MD  lidocaine  (LIDODERM ) 5 % Place 1 patch onto the skin daily. Remove & Discard patch within 12 hours or as directed by MD Patient taking differently: Place 1 patch onto the skin daily as needed (pain). Remove & Discard patch within 12 hours or as directed by MD 07/26/20  Yes Magrinat, Sandria BROCKS, MD  tamoxifen  (NOLVADEX ) 20 MG tablet TAKE 1 TABLET BY MOUTH EVERY DAY 05/21/23  Yes Iruku, Praveena, MD   No results  found.  Positive ROS: All other systems have been reviewed and were otherwise negative with the exception of those mentioned in the HPI and as above.  Physical Exam: General: Alert, no acute distress Cardiovascular: No pedal edema Respiratory: No cyanosis, no use of accessory musculature GI: No organomegaly, abdomen is soft and non-tender Skin: No lesions in the area of chief complaint Neurologic: Sensation intact distally Psychiatric: Patient is competent for consent with normal mood and affect Lymphatic: No axillary or cervical lymphadenopathy  MUSCULOSKELETAL: Right upper extremity is warm and well-perfused.  No open wounds or lesions.  Assessment: 1.  Right shoulder adhesive capsulitis  2.  Right shoulder subacromial impingement  3.  Right shoulder biceps tendinitis  Plan: Plan to proceed today with arthroscopic intervention of the right shoulder.  We discussed moving forward with arthroscopic lysis of adhesion, biceps tenotomy as well as subacromial decompression and bursectomy.  We discussed the risk and benefits of the procedure which include but are not limited to bleeding, infection, damage to surrounding nerves and vessels as well as the risk of anesthesia.  She has provided informed consent.  DC home postop from PACU.    Christine Belvie Gosling, MD Cell [phone]    08/08/2024 11:45 AM     [1]  Allergies Allergen Reactions   Codeine Nausea And Vomiting   "

## 2024-08-08 NOTE — Op Note (Signed)
 Date of Surgery: 08/08/2024  INDICATIONS: Christine Mosley is a 68 y.o.-year-old female with a right symptomatic adhesive capsulitis as well as impingement and biceps tendinitis symptoms.  Today for arthroscopic assisted surgery with manipulation.;  The patient did consent to the procedure after discussion of the risks and benefits.  PREOPERATIVE DIAGNOSIS:  Right shoulder adhesive capsulitis Right shoulder subacromial impingement Right shoulder biceps tendinitis  POSTOPERATIVE DIAGNOSIS: Same.  PROCEDURE:  Right shoulder arthroscopic extensive debridement with capsular release, biceps tenotomy, subacromial bursectomy Right shoulder subacromial decompression Right shoulder manipulation under anesthesia  SURGEON: Selinda SHAUNNA Gosling, M.D.  ASSIST: Dayle Moores, PA-C  Assistant attestation:  PA Mcclung scrubbed and present for the entire procedure..  ANESTHESIA:  general, interscalene with Exparel   IV FLUIDS AND URINE: See anesthesia.  ESTIMATED BLOOD LOSS: 10 mL.  IMPLANTS: None  DRAINS: None  COMPLICATIONS: None.  DESCRIPTION OF PROCEDURE: The patient was brought to the operating room and placed supine on the operating table.  The patient had been signed prior to the procedure and this was documented. The patient had the anesthesia placed by the anesthesiologist.  A time-out was performed to confirm that this was the correct patient, site, side and location. The patient did receive antibiotics prior to the incision and was re-dosed during the procedure as needed at indicated intervals.  A tourniquet was not placed.  The patient had the operative extremity prepped and draped in the standard surgical fashion.      After obtaining informed consent the patient was brought to the operating table and underwent satisfactory anesthesia. An exam under anesthesia revealed decreased range of motion with abduction and internal rotation.  Abduction only 40 degrees and internal rotation to her side.   Elevation 120.  She was placed in the left lateral decubitus position with an axillary roll and all bony prominences properly padded. A standard surgical timeout was performed. He was placed in 10 pounds of gentle in-line suspension.  Standard posterior and anterior superior portals were established. A diagnostic evaluation of the glenohumeral joint was performed. The biceps tendon was markedly synovitic and partially torn. It was tenotomized. An extensive debridement was performed of the superior anterior and posterior labrum.  Next, we introduced the radiofrequency wand into the mid glenoid portal and performed a 180 degree capsular release from the 3 o'clock position to the 9 o'clock position.  This created nice distraction of the capsule from a previously fairly contracted position with thickening noted as well.  Diagnostic arthroscopy was then completed which identified intact rotator cuff musculature x 4.  No loose bodies and no chondromalacia.   The arthroscope was inserted in the subacromial space and an additional lateral portal was established. An acromioplasty performed nicely decompressing the subacromial space with a motorized burr. Bursitis in the subacromial space was removed.  Next, the arm was ranged and found to have elevation of 140, external rotation of 80 and internal rotation of L1.  Abduction also improved to 90 degrees.  The arthroscope was then removed and portals closed with 3-0 Monocryl in standard fashion followed by a sterile occlusive dressing Polar Care ice sleeve and a slingshot sling. The patient was sent to recovery in stable condition and tolerated the procedure well  POSTOPERATIVE PLAN:  Christine Mosley to be discharged home today from PACU.  She will be in her sling for just comfort only.  She can remove and begin early aggressive range of motion with no restrictions on range of motion or strengthening.

## 2024-08-08 NOTE — Brief Op Note (Signed)
 08/08/2024  2:50 PM  PATIENT:  Christine Mosley  68 y.o. female  PRE-OPERATIVE DIAGNOSIS:  Right shoulder osteoarthritis, impingement  POST-OPERATIVE DIAGNOSIS:  Right shoulder osteoarthritis, impingement  PROCEDURE:  Procedures with comments: ARTHROSCOPY, SHOULDER WITH DEBRIDEMENT (Right) - Right shoulder arthroscopy with debridement, subacromial decompression and capsule release DECOMPRESSION, SUBACROMIAL SPACE (Right)  SURGEON:  Surgeons and Role:    * Sharl Selinda Dover, MD - Primary  PHYSICIAN ASSISTANT: Dayle Moores, PA-C   ANESTHESIA:   regional and general  EBL:  10 mL   BLOOD ADMINISTERED:none  DRAINS: none   LOCAL MEDICATIONS USED:  NONE  SPECIMEN:  No Specimen  DISPOSITION OF SPECIMEN:  N/A  COUNTS:  YES  TOURNIQUET:  * No tourniquets in log *  DICTATION: .Note written in EPIC  PLAN OF CARE: Discharge to home after PACU  PATIENT DISPOSITION:  PACU - hemodynamically stable.   Delay start of Pharmacological VTE agent (>24hrs) due to surgical blood loss or risk of bleeding: not applicable

## 2024-08-08 NOTE — Anesthesia Procedure Notes (Signed)
 Procedure Name: Intubation Date/Time: 08/08/2024 2:18 PM  Performed by: Donnell Berwyn SQUIBB, CRNAPre-anesthesia Checklist: Patient identified, Emergency Drugs available, Suction available, Patient being monitored and Timeout performed Patient Re-evaluated:Patient Re-evaluated prior to induction Oxygen Delivery Method: Circle system utilized Preoxygenation: Pre-oxygenation with 100% oxygen Induction Type: IV induction Ventilation: Mask ventilation without difficulty Laryngoscope Size: Mac and 3 Grade View: Grade I Tube type: Oral Tube size: 7.0 mm Number of attempts: 1 Airway Equipment and Method: Stylet Placement Confirmation: ETT inserted through vocal cords under direct vision, positive ETCO2 and breath sounds checked- equal and bilateral Secured at: 20 cm Tube secured with: Tape Dental Injury: Teeth and Oropharynx as per pre-operative assessment

## 2024-08-08 NOTE — Transfer of Care (Signed)
 Immediate Anesthesia Transfer of Care Note  Patient: Christine Mosley  Procedure(s) Performed: ARTHROSCOPY, SHOULDER WITH DEBRIDEMENT (Right: Shoulder) DECOMPRESSION, SUBACROMIAL SPACE (Right: Shoulder)  Patient Location: PACU  Anesthesia Type:General and Regional  Level of Consciousness: awake, alert , and patient cooperative  Airway & Oxygen Therapy: Patient Spontanous Breathing and Patient connected to nasal cannula oxygen  Post-op Assessment: Report given to RN and Post -op Vital signs reviewed and stable  Post vital signs: Reviewed and stable  Last Vitals:  Vitals Value Taken Time  BP 128/76 08/08/24 15:03  Temp    Pulse 78 08/08/24 15:06  Resp 23 08/08/24 15:06  SpO2 93 % 08/08/24 15:06  Vitals shown include unfiled device data.  Last Pain:  Vitals:   08/08/24 1138  TempSrc: Temporal  PainSc: 0-No pain      Patients Stated Pain Goal: 4 (08/08/24 1138)  Complications: No notable events documented.

## 2024-08-08 NOTE — Anesthesia Procedure Notes (Signed)
 Anesthesia Regional Block: Interscalene brachial plexus block   Pre-Anesthetic Checklist: , timeout performed,  Correct Patient, Correct Site, Correct Laterality,  Correct Procedure, Correct Position, site marked,  Risks and benefits discussed,  Surgical consent,  Pre-op evaluation,  At surgeon's request and post-op pain management  Laterality: Right  Prep: chloraprep       Needles:  Injection technique: Single-shot  Needle Type: Echogenic Stimulator Needle     Needle Length: 9cm  Needle Gauge: 22     Additional Needles:   Procedures:,,,, ultrasound used (permanent image in chart),,    Narrative:  Start time: 08/08/2024 12:01 PM End time: 08/08/2024 12:08 PM Injection made incrementally with aspirations every 5 mL.  Performed by: Personally  Anesthesiologist: Corinne Garnette BRAVO, MD  Additional Notes: Functioning IV was confirmed and monitors were applied.  A 90mm 22ga echogenic stimulator needle was used. Sterile prep and drape, hand hygiene, and sterile gloves were used.  Negative aspiration and negative test dose prior to incremental administration of local anesthetic. The patient tolerated the procedure well.  Ultrasound guidance: relevent anatomy identified, needle position confirmed, local anesthetic spread visualized around nerve(s), vascular puncture avoided.  Image printed for medical record.

## 2024-08-08 NOTE — Discharge Instructions (Addendum)
 Orthopedic surgery discharge instructions:  -Maintain postoperative bandage in place for 3 days.  Thursday night removed.  Please do not Steri-Strips.  This is waterproof, and you may begin showering on postoperative day #3.  Do not submerge underwater.  Maintain that bandage until your follow-up appointment in 2 weeks.  -No lifting over 2 pounds with operateive arm.  You may use the arm immediately for activities of daily living such as bathing, washing your face and brushing your teeth, eating, and getting dressed.  Otherwise maintain your sling when you are out of the house and sleeping.  -Apply ice liberally to the shoulder throughout the day.  For mild to moderate pain use Tylenol  and Advil as needed around-the-clock.  For breakthrough pain use oxycodone  as necessary.  -You will return to see Dr. Sharl in the office in 2 weeks for routine postoperative check with x-rays.    Post Anesthesia Home Care Instructions  Activity: Get plenty of rest for the remainder of the day. A responsible individual must stay with you for 24 hours following the procedure.  For the next 24 hours, DO NOT: -Drive a car -Advertising copywriter -Drink alcoholic beverages -Take any medication unless instructed by your physician -Make any legal decisions or sign important papers.  Meals: Start with liquid foods such as gelatin or soup. Progress to regular foods as tolerated. Avoid greasy, spicy, heavy foods. If nausea and/or vomiting occur, drink only clear liquids until the nausea and/or vomiting subsides. Call your physician if vomiting continues.  Special Instructions/Symptoms: Your throat may feel dry or sore from the anesthesia or the breathing tube placed in your throat during surgery. If this causes discomfort, gargle with warm salt water. The discomfort should disappear within 24 hours.  If you had a scopolamine patch placed behind your ear for the management of post- operative nausea and/or vomiting:  1.  The medication in the patch is effective for 72 hours, after which it should be removed.  Wrap patch in a tissue and discard in the trash. Wash hands thoroughly with soap and water. 2. You may remove the patch earlier than 72 hours if you experience unpleasant side effects which may include dry mouth, dizziness or visual disturbances. 3. Avoid touching the patch. Wash your hands with soap and water after contact with the patch.    Regional Anesthesia Blocks  1. You may not be able to move or feel the blocked extremity after a regional anesthetic block. This may last may last from 3-48 hours after placement, but it will go away. The length of time depends on the medication injected and your individual response to the medication. As the nerves start to wake up, you may experience tingling as the movement and feeling returns to your extremity. If the numbness and inability to move your extremity has not gone away after 48 hours, please call your surgeon.   2. The extremity that is blocked will need to be protected until the numbness is gone and the strength has returned. Because you cannot feel it, you will need to take extra care to avoid injury. Because it may be weak, you may have difficulty moving it or using it. You may not know what position it is in without looking at it while the block is in effect.  3. For blocks in the legs and feet, returning to weight bearing and walking needs to be done carefully. You will need to wait until the numbness is entirely gone and the strength has returned.  You should be able to move your leg and foot normally before you try and bear weight or walk. You will need someone to be with you when you first try to ensure you do not fall and possibly risk injury.  4. Bruising and tenderness at the needle site are common side effects and will resolve in a few days.  5. Persistent numbness or new problems with movement should be communicated to the surgeon or the [facility] [phone] [facility] [phone]).  Information for Discharge Teaching: EXPAREL  (bupivacaine  liposome injectable suspension)   Pain relief is important to your recovery. The goal is to control your pain so you can move easier and return to your normal activities as soon as possible after your procedure. Your physician may use several types of medicines to manage pain, swelling, and more.  Your surgeon or anesthesiologist gave you EXPAREL (bupivacaine ) to help control your pain after surgery.  EXPAREL  is a local anesthetic designed to release slowly over an extended period of time to provide pain relief by numbing the tissue around the surgical site. EXPAREL  is designed to release pain medication over time and can control pain for up to 72 hours. Depending on how you respond to EXPAREL , you may require less pain medication during your recovery. EXPAREL  can help reduce or eliminate the need for opioids during the first few days after surgery when pain relief is needed the most. EXPAREL  is not an opioid and is not addictive. It does not cause sleepiness or sedation.   Important! A teal colored band has been placed on your arm with the date, time and amount of EXPAREL  you have received. Please leave this armband in place for the full 96 hours following administration, and then you may remove the band. If you return to the hospital for any reason within 96 hours following the administration of EXPAREL , the armband provides important information that your health care providers to know, and alerts them that you have received this anesthetic.    Possible side effects of EXPAREL : Temporary loss of sensation or ability to move in the area where medication was injected. Nausea, vomiting, constipation Rarely, numbness and tingling in your mouth or lips, lightheadedness, or anxiety may occur. Call your doctor right away if you think you may be experiencing any of these  sensations, or if you have other questions regarding possible side effects.  Follow all other discharge instructions given to you by your surgeon or nurse. Eat a healthy diet and drink plenty of water or other fluids.  Next dose of tylenol  will be 5:40pm tonight

## 2024-08-09 ENCOUNTER — Encounter (HOSPITAL_BASED_OUTPATIENT_CLINIC_OR_DEPARTMENT_OTHER): Payer: Self-pay | Admitting: Orthopedic Surgery

## 2024-08-25 ENCOUNTER — Ambulatory Visit: Attending: Physician Assistant

## 2024-08-25 DIAGNOSIS — M25611 Stiffness of right shoulder, not elsewhere classified: Secondary | ICD-10-CM | POA: Diagnosis present

## 2024-08-25 DIAGNOSIS — R29898 Other symptoms and signs involving the musculoskeletal system: Secondary | ICD-10-CM | POA: Diagnosis present

## 2024-08-25 DIAGNOSIS — M25511 Pain in right shoulder: Secondary | ICD-10-CM | POA: Insufficient documentation

## 2024-08-25 NOTE — Therapy (Signed)
 " OUTPATIENT PHYSICAL THERAPY SHOULDER EVALUATION   Patient Name: Christine Mosley MRN: [id number] DOB:[birth year], 69 y.o., female Today's Date: 08/25/2024  END OF SESSION:  PT End of Session - 08/25/24 1449     Visit Number 1    Number of Visits 13    Date for Recertification  10/06/24    Authorization Type 2x/week x 6 weeks, PN at visit #10 for Medicare    PT Start Time 1340    PT Stop Time 1430    PT Time Calculation (min) 50 min    Activity Tolerance Patient tolerated treatment well    Behavior During Therapy [facility] for tasks assessed/performed          Past Medical History:  Diagnosis Date   Arthritis    Breast cancer (HCC) 2005   right breast-lumpectomy, chemo. 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   Dyspnea    Family history of lung cancer    Family history of prostate cancer    Family history of skin cancer    Personal history of chemotherapy    Personal history of radiation therapy    Past Surgical History:  Procedure Laterality Date   ABDOMINAL HYSTERECTOMY     BLADDER SURGERY     BREAST BIOPSY Left 04/06/2020   BREAST LUMPECTOMY Right 2005   BREAST LUMPECTOMY Left 04/27/2020   BREAST LUMPECTOMY WITH RADIOACTIVE SEED AND SENTINEL LYMPH NODE BIOPSY Left 04/27/2020   Procedure: LEFT BREAST LUMPECTOMY WITH RADIOACTIVE SEED AND LEFT AXILLARY SENTINEL LYMPH NODE BIOPSY;  Surgeon: Ebbie Cough, MD;  Location: [facility];  Service: General;  Laterality: Left;   CESAREAN SECTION     x 1   POSTERIOR LUMBAR FUSION 2 WITH HARDWARE REMOVAL Right 08/08/2024   Procedure: ARTHROSCOPY, SHOULDER WITH DEBRIDEMENT;  Surgeon: Sharl Selinda Dover, MD;  Location: [facility];  Service: Orthopedics;  Laterality: Right;  Right shoulder arthroscopy with debridement, subacromial decompression and capsule release   SHOULDER SURGERY Left    SUBACROMIAL DECOMPRESSION Right 08/08/2024   Procedure: DECOMPRESSION, SUBACROMIAL  SPACE;  Surgeon: Sharl Selinda Dover, MD;  Location: [facility];  Service: Orthopedics;  Laterality: Right;   Patient Active Problem List   Diagnosis Date Noted   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   Traumatic subarachnoid hemorrhage without loss of consciousness, initial encounter (HCC) 06/06/2024   Periorbital hematoma of right eye 06/06/2024   Fibrosis, breast, left 08/03/2020   Genetic testing 04/29/2020   Malignant neoplasm of upper-outer quadrant of right breast in female, estrogen receptor positive (HCC) 04/14/2020   Family history of prostate cancer    Family history of lung cancer    Family history of skin cancer    Malignant neoplasm of upper-inner quadrant of left breast in female, estrogen receptor positive (HCC) 04/12/2020    PCP: no PCP  REFERRING PROVIDER: Dayle Moores, PA  REFERRING DIAG: M19.011 (ICD-10-CM) - Primary osteoarthritis, right shoulder   THERAPY DIAG:  Acute pain of right shoulder  Stiffness of right shoulder, not elsewhere classified  Weakness of right shoulder  Rationale for Evaluation and Treatment: Rehabilitation  ONSET DATE: 08/08/24  SUBJECTIVE:  SUBJECTIVE STATEMENT: Pt states she had R shoulder surgery on 08/08/24; states she was having difficulty using her R shoulder and was having a lot of pain associated with moving her shoulder- this why she had surgery.  Had difficulty finding a PT location to take her insurance so today is her first outpatient PT visit after surgery.  Pt c/o: R shoulder pain, tightness, difficulty moving her R shoulder  Stopped wearing the sling last week    Agg: sleeping, lifting, reaching overhead  Allev: trying to do some movement, using heat/cold pack  Hand dominance: Right  PERTINENT HISTORY: 2021- had L  shoulder surgery (same procedure)  Has not returned to driving yet after surgery; her husband brought her today to PT.  PAIN:  Are you having pain? Yes 0-5-7/10; sx reduce quickly when she comes out of irritating positions  PRECAUTIONS: None  RED FLAGS: None   WEIGHT BEARING RESTRICTIONS: No  FALLS:  Has patient fallen in last 6 months? Yes. Number of falls 1- big fall face down; was hospitalized and had TBI ; had to put off her shoulder surgery for awhile  LIVING ENVIRONMENT: Lives with: lives with their family Lives in: House/apartment Stairs: 3 to enter; not having difficulty  Has following equipment at home: None  OCCUPATION: Not working- she enjoys cross stitch, cooking, sewing (machine and by hand); retired   PLOF: Independent   PATIENT GOALS:Pt's PT goal is to get her motion back in her shoulder    NEXT MD VISIT: 09/16/24  OBJECTIVE:  Note: Objective measures were completed at Evaluation unless otherwise noted.  DIAGNOSTIC FINDINGS:  Pt is currently post-op  PATIENT SURVEYS:  Quick Dash- 68% today  COGNITION: Overall cognitive status: Within functional limits for tasks assessed     SENSATION: WFL  POSTURE: Forward head, rounded forward shoulders   UPPER EXTREMITY ROM:   Passive ROM Right Eval * Left eval  Shoulder flexion 140 155  Shoulder extension    Shoulder abduction NT 172  Shoulder adduction    Shoulder internal rotation 58  85  Shoulder external rotation 60 (at 45 deg abd) 60 (at 45 abd)  Elbow flexion normal normal  Elbow extension normal normal  Wrist flexion    Wrist extension    Wrist ulnar deviation    Wrist radial deviation    Wrist pronation    Wrist supination    (Blank rows = not tested)  HBB: 14 cm difference between L and R HBH: L able to do; R compensated with cervical flexion  UPPER EXTREMITY MMT: deferred to visit #2, will look at this next time  MMT Right eval Left eval  Shoulder flexion    Shoulder extension     Shoulder abduction    Shoulder adduction    Shoulder internal rotation    Shoulder external rotation    Middle trapezius    Lower trapezius    Elbow flexion    Elbow extension    Wrist flexion    Wrist extension    Wrist ulnar deviation    Wrist radial deviation    Wrist pronation    Wrist supination    Grip strength (lbs)    (Blank rows = not tested)  JOINT MOBILITY TESTING:  PAM: GH jt- Inferior glide, posterior, lateral distraction all hypomobile on R; normal on L   PALPATION:  TTP along posterior incision site  (all 3 incision sites are healing well)  TREATMENT DATE: 08/25/24 Initial evaluation  Therapeutic Exercises: Supine R shoulder flexion AAROM (with wand): 5x10 seconds Supine R flexion AROM x 5 Pulleys: flexion, 5-10 second holds x 10 Towel IR behind back: 5-10 second holds x10  HEP instruction   PATIENT EDUCATION: Education details: HEP, exercise technique, cold/hot back for pain control Person educated: Patient Education method: Explanation, Demonstration, Tactile cues, Verbal cues, and Handouts Education comprehension: verbalized understanding and returned demonstration  HOME EXERCISE PROGRAM:  Access Code: [email or web address] URL: [email or web address] Date: 08/25/2024 Prepared by: Vernell Reges  Exercises - Seated Shoulder Flexion AAROM with Pulley Behind  - 2 x daily - 7 x weekly - 10 reps - 10 hold - Supine Shoulder External Rotation with Dowel at 20 Degrees of Abduction  - 2 x daily - 7 x weekly - 10 reps - 10 hold - Supine Shoulder Flexion with Dowel  - 2 x daily - 7 x weekly - 10 reps - 1- hold - Standing Shoulder Internal Rotation Stretch with Towel  - 2 x daily - 7 x weekly - 10 reps - 10 hold  ASSESSMENT:  CLINICAL IMPRESSION: Patient is a 69 y.o. F who was seen today for physical therapy evaluation and  treatment for a course of outpatient PT s/p R shoulder arthroscopic debridement.  She is motivated to participate in tx and should do well with this course of PT.  During initial evaluation her shoulder IR HBB improved after towel exercise- gained 2 cm of mobility and actively able flex shoulder to 150 deg without pain at end of session.   OBJECTIVE IMPAIRMENTS: decreased activity tolerance, decreased mobility, decreased ROM, decreased strength, impaired perceived functional ability, impaired UE functional use, and pain.   ACTIVITY LIMITATIONS: carrying, lifting, sleeping, dressing, self feeding, reach over head, and hygiene/grooming  PARTICIPATION LIMITATIONS: meal prep, cleaning, laundry, interpersonal relationship, driving, shopping, and community activity  PERSONAL FACTORS: Past/current experiences and Time since onset of injury/illness/exacerbation are also affecting patient's functional outcome.   REHAB POTENTIAL: Excellent  CLINICAL DECISION MAKING: Stable/uncomplicated  EVALUATION COMPLEXITY: Low   GOALS: Goals reviewed with patient? Yes  SHORT TERM GOALS: Target date: 09/08/24  Improve R shoulder IR 10 degrees to facilitate improved ability to reach behind back for dressing Baseline: 58 deg, HBB thumb to L1 Goal status: INITIAL    LONG TERM GOALS: Target date: 10/06/24  Improve quick DASH >20 points to facilitate improved ability to perform her house chores and cooking without being limited by R shoulder Baseline: 68% at initial evaluation Goal status: INITIAL  2.  Improve R shoulder flexion AROM/PROM to 155 to facilitate improved ability to reach overhead for items on top of her fridge/tall shelf Baseline: 140 PROM Goal status: INITIAL  3.  Pt will be able to perform 30 min housework with <2/10 R shoulder pain Baseline: 5-7/10 and not doing housework Goal status: INITIAL   Strength goal will be set at visit #2  PLAN:  PT FREQUENCY: 2x/week  PT DURATION: 6  weeks  PLANNED INTERVENTIONS: 97110-Therapeutic exercises, 97530- Therapeutic activity, 97112- Neuromuscular re-education, 97535- Self Care, [id number]- Manual therapy, Patient/Family education, Joint mobilization, Scar mobilization, Cryotherapy, and Moist heat  PLAN FOR NEXT SESSION: assess MMT, ask more about sensation in R UE; continue with ROM- AROM and PROM  Vernell Reges, PT, DPT, OCS  #[id number]   Vernell FORBES Reges, PT 08/25/2024, 3:02 PM  "

## 2024-08-26 ENCOUNTER — Encounter (HOSPITAL_BASED_OUTPATIENT_CLINIC_OR_DEPARTMENT_OTHER): Payer: Self-pay | Admitting: Orthopedic Surgery

## 2024-08-27 ENCOUNTER — Ambulatory Visit

## 2024-08-27 DIAGNOSIS — M25511 Pain in right shoulder: Secondary | ICD-10-CM

## 2024-08-27 DIAGNOSIS — M25611 Stiffness of right shoulder, not elsewhere classified: Secondary | ICD-10-CM

## 2024-08-27 DIAGNOSIS — R29898 Other symptoms and signs involving the musculoskeletal system: Secondary | ICD-10-CM

## 2024-08-27 NOTE — Therapy (Signed)
 " OUTPATIENT PHYSICAL THERAPY SHOULDER TREATMENT   Patient Name: Christine Mosley MRN: [id number] DOB:01-Oct-1955, 69 y.o., female Today's Date: 08/27/2024  END OF SESSION:  PT End of Session - 08/27/24 1554     Visit Number 2    Number of Visits 13    Date for Recertification  10/06/24    Authorization Type 2x/week x 6 weeks, PN at visit #10 for Medicare    PT Start Time 1545    PT Stop Time 1630    PT Time Calculation (min) 45 min    Activity Tolerance Patient tolerated treatment well    Behavior During Therapy [facility] for tasks assessed/performed          Past Medical History:  Diagnosis Date   Arthritis    Breast cancer (HCC) 2005   right breast-lumpectomy, chemo. 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   Dyspnea    Family history of lung cancer    Family history of prostate cancer    Family history of skin cancer    Personal history of chemotherapy    Personal history of radiation therapy    Past Surgical History:  Procedure Laterality Date   ABDOMINAL HYSTERECTOMY     BLADDER SURGERY     BREAST BIOPSY Left 04/06/2020   BREAST LUMPECTOMY Right 2005   BREAST LUMPECTOMY Left 04/27/2020   BREAST LUMPECTOMY WITH RADIOACTIVE SEED AND SENTINEL LYMPH NODE BIOPSY Left 04/27/2020   Procedure: LEFT BREAST LUMPECTOMY WITH RADIOACTIVE SEED AND LEFT AXILLARY SENTINEL LYMPH NODE BIOPSY;  Surgeon: Ebbie Cough, MD;  Location: [facility];  Service: General;  Laterality: Left;   CESAREAN SECTION     x 1   SHOULDER SURGERY Left    SUBACROMIAL DECOMPRESSION Right 08/08/2024   Procedure: DECOMPRESSION, SUBACROMIAL SPACE;  Surgeon: Sharl Selinda Dover, MD;  Location: [facility];  Service: Orthopedics;  Laterality: Right;   Patient Active Problem List   Diagnosis Date Noted   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   Traumatic subarachnoid hemorrhage without loss of consciousness, initial encounter (HCC) 06/06/2024    Periorbital hematoma of right eye 06/06/2024   Fibrosis, breast, left 08/03/2020   Genetic testing 04/29/2020   Malignant neoplasm of upper-outer quadrant of right breast in female, estrogen receptor positive (HCC) 04/14/2020   Family history of prostate cancer    Family history of lung cancer    Family history of skin cancer    Malignant neoplasm of upper-inner quadrant of left breast in female, estrogen receptor positive (HCC) 04/12/2020    PCP: no PCP  REFERRING PROVIDER: Dayle Moores, PA  REFERRING DIAG: M19.011 (ICD-10-CM) - Primary osteoarthritis, right shoulder   THERAPY DIAG:  Acute pain of right shoulder  Stiffness of right shoulder, not elsewhere classified  Weakness of right shoulder  Rationale for Evaluation and Treatment: Rehabilitation  ONSET DATE: 08/08/24  SUBJECTIVE:  SUBJECTIVE STATEMENT: Pt states she had R shoulder surgery on 08/08/24; states she was having difficulty using her R shoulder and was having a lot of pain associated with moving her shoulder- this why she had surgery.  Had difficulty finding a PT location to take her insurance so today is her first outpatient PT visit after surgery.  Pt c/o: R shoulder pain, tightness, difficulty moving her R shoulder  Stopped wearing the sling last week    Agg: sleeping, lifting, reaching overhead  Allev: trying to do some movement, using heat/cold pack  Hand dominance: Right  PERTINENT HISTORY: 2021- had L shoulder surgery (same procedure)  Has not returned to driving yet after surgery; her husband brought her today to PT.  PAIN:  Are you having pain? Yes 0-5-7/10; sx reduce quickly when she comes out of irritating positions  PRECAUTIONS: None  RED FLAGS: None   WEIGHT BEARING RESTRICTIONS: No  FALLS:  Has patient  fallen in last 6 months? Yes. Number of falls 1- big fall face down; was hospitalized and had TBI ; had to put off her shoulder surgery for awhile  LIVING ENVIRONMENT: Lives with: lives with their family Lives in: House/apartment Stairs: 3 to enter; not having difficulty  Has following equipment at home: None  OCCUPATION: Not working- she enjoys cross stitch, cooking, sewing (machine and by hand); retired   PLOF: Independent   PATIENT GOALS:Pt's PT goal is to get her motion back in her shoulder    NEXT MD VISIT: 09/16/24  OBJECTIVE:  Note: Objective measures were completed at Evaluation unless otherwise noted.  DIAGNOSTIC FINDINGS:  Pt is currently post-op  PATIENT SURVEYS:  Quick Dash- 68% today  COGNITION: Overall cognitive status: Within functional limits for tasks assessed     SENSATION: WFL  POSTURE: Forward head, rounded forward shoulders   UPPER EXTREMITY ROM:   Passive ROM Right Eval * Left eval  Shoulder flexion 140 155  Shoulder extension    Shoulder abduction NT 172  Shoulder adduction    Shoulder internal rotation 58  85  Shoulder external rotation 60 (at 45 deg abd) 60 (at 45 abd)  Elbow flexion normal normal  Elbow extension normal normal  Wrist flexion    Wrist extension    Wrist ulnar deviation    Wrist radial deviation    Wrist pronation    Wrist supination    (Blank rows = not tested)  HBB: 14 cm difference between L and R HBH: L able to do; R compensated with cervical flexion  UPPER EXTREMITY MMT: deferred to visit #2, will look at this next time   (Assessed on 08/27/24) MMT Right eval Left eval  Shoulder flexion 3+ pain 4  Scapular elevation 5 5  Shoulder abduction 3+ pain 4+  Shoulder adduction    Shoulder internal rotation 3+ pain 4  Shoulder external rotation 4- no pain 4  Middle trapezius 3 4  Lower trapezius    Elbow flexion 4 no pain 5  Elbow extension 5 5  Wrist flexion    Wrist extension    Wrist ulnar deviation     Wrist radial deviation    Wrist pronation    Wrist supination    Grip strength (lbs)    (Blank rows = not tested)  JOINT MOBILITY TESTING:  PAM: GH jt- Inferior glide, posterior, lateral distraction all hypomobile on R; normal on L   PALPATION:  TTP along posterior incision site  (all 3 incision sites are healing well)  TREATMENT DATE: 08/27/24 Subjective: pt reports she tried the HEP; prefers pulleys over the wand exercise.  She was able to put her hair up on her own for the first time today.  Pain: 3/10  Objective: MMT performed (see above) R Shoulder AROM: IR 62, ER 76, abd 150, flex 165 (goniometer used)  Therapeutic Exercises: PROM shoulder flexion- flexion, ER, IR, abd today- 5 minutes Supine R shoulder flexion AAROM (with wand): 5x10 seconds- not today, reviewed for HEP Supine R flexion AROM x 5 Pulleys: flexion, 5-10 second holds x 10- reviewed for HEP Towel IR behind back: 5-10 second holds x10 Scapular retraction + ER: 5 second holds x10 (red TB)  HEP instruction: see below, updated medbridge  PATIENT EDUCATION: Education details: HEP, exercise technique, cold/hot back for pain control Person educated: Patient Education method: Explanation, Demonstration, Tactile cues, Verbal cues, and Handouts Education comprehension: verbalized understanding and returned demonstration  HOME EXERCISE PROGRAM: Access Code: [email or web address] URL: [email or web address] Date: 08/27/2024 Prepared by: Vernell Reges  Exercises - Seated Shoulder Flexion AAROM with Pulley Behind  - 2 x daily - 7 x weekly - 10 reps - 10 hold - Supine Shoulder External Rotation with Dowel at 20 Degrees of Abduction  - 2 x daily - 7 x weekly - 10 reps - 10 hold - Supine Shoulder Flexion with Dowel  - 2 x daily - 7 x weekly - 10 reps - 1- hold - Standing Shoulder Internal  Rotation Stretch with Towel  - 2 x daily - 7 x weekly - 10 reps - 10 hold - Shoulder External Rotation and Scapular Retraction with Resistance  - 1 x daily - 7 x weekly - 2 sets - 10 reps - Sidelying Shoulder Abduction Palm Forward  - 1 x daily - 7 x weekly - 2 sets - 10 reps - Shoulder Flexion Wall Slide with Towel  - 1 x daily - 7 x weekly - 32 sets - 10 reps  ASSESSMENT:  CLINICAL IMPRESSION: Patient is a 69 y.o. F who was seen today for physical therapy treatment for a course of outpatient PT s/p R shoulder arthroscopic debridement.  She is motivated to participate in tx and should do well with this course of PT.  MMT performed today reveals pt would benefit from incorporating muscle retraining/strengthening into her POC for optimal long term recovery.  Pt's HEP was progressed to included RTC and scapulothoracic mm retraining.  Overall, she tolerated today's session well, no increase in pain above 3/10 during or after tx.  Should continue to benefit from skilled PT to address impairments listed below and facilitate return to full use of her R UE for ADLs, housework, driving, shopping, and community activities.  OBJECTIVE IMPAIRMENTS: decreased activity tolerance, decreased mobility, decreased ROM, decreased strength, impaired perceived functional ability, impaired UE functional use, and pain.   ACTIVITY LIMITATIONS: carrying, lifting, sleeping, dressing, self feeding, reach over head, and hygiene/grooming  PARTICIPATION LIMITATIONS: meal prep, cleaning, laundry, interpersonal relationship, driving, shopping, and community activity  PERSONAL FACTORS: Past/current experiences and Time since onset of injury/illness/exacerbation are also affecting patient's functional outcome.   REHAB POTENTIAL: Excellent  CLINICAL DECISION MAKING: Stable/uncomplicated  EVALUATION COMPLEXITY: Low   GOALS: Goals reviewed with patient? Yes  SHORT TERM GOALS: Target date: 09/08/24  Improve R shoulder IR 10  degrees to facilitate improved ability to reach behind back for dressing Baseline: 58 deg, HBB thumb to L1 Goal status: INITIAL    LONG TERM GOALS: Target date: 10/06/24  Improve quick DASH >  20 points to facilitate improved ability to perform her house chores and cooking without being limited by R shoulder Baseline: 68% at initial evaluation Goal status: INITIAL  2.  Improve R shoulder flexion AROM/PROM to 155 to facilitate improved ability to reach overhead for items on top of her fridge/tall shelf Baseline: 140 PROM Goal status: INITIAL  3.  Pt will be able to perform 30 min housework with <2/10 R shoulder pain Baseline: 5-7/10 and not doing housework Goal status: INITIAL   Strength goal will be set at visit #2  PLAN:  PT FREQUENCY: 2x/week  PT DURATION: 6 weeks  PLANNED INTERVENTIONS: 97110-Therapeutic exercises, 97530- Therapeutic activity, 97112- Neuromuscular re-education, 97535- Self Care, 02859- Manual therapy, Patient/Family education, Joint mobilization, Scar mobilization, Cryotherapy, and Moist heat  PLAN FOR NEXT SESSION: ; continue with ROM- AROM and PROM, strengthening  Vernell Reges, PT, DPT, OCS  #[id number]   Vernell FORBES Reges, PT 08/27/2024, 3:55 PM  "

## 2024-09-01 ENCOUNTER — Ambulatory Visit

## 2024-09-01 DIAGNOSIS — R29898 Other symptoms and signs involving the musculoskeletal system: Secondary | ICD-10-CM

## 2024-09-01 DIAGNOSIS — M25511 Pain in right shoulder: Secondary | ICD-10-CM

## 2024-09-01 DIAGNOSIS — M25611 Stiffness of right shoulder, not elsewhere classified: Secondary | ICD-10-CM

## 2024-09-01 NOTE — Therapy (Signed)
 " OUTPATIENT PHYSICAL THERAPY SHOULDER TREATMENT   Patient Name: Christine Mosley MRN: [id number] DOB:1956/04/19, 69 y.o., female Today's Date: 09/01/2024  END OF SESSION:  PT End of Session - 09/01/24 1028     Visit Number 3    Number of Visits 13    Date for Recertification  10/06/24    Authorization Type 2x/week x 6 weeks, PN at visit #10 for Medicare    PT Start Time 1028    Activity Tolerance Patient tolerated treatment well    Behavior During Therapy [facility] for tasks assessed/performed          Past Medical History:  Diagnosis Date   Arthritis    Breast cancer (HCC) 2005   right breast-lumpectomy, chemo. 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   Dyspnea    Family history of lung cancer    Family history of prostate cancer    Family history of skin cancer    Personal history of chemotherapy    Personal history of radiation therapy    Past Surgical History:  Procedure Laterality Date   ABDOMINAL HYSTERECTOMY     BLADDER SURGERY     BREAST BIOPSY Left 04/06/2020   BREAST LUMPECTOMY Right 2005   BREAST LUMPECTOMY Left 04/27/2020   BREAST LUMPECTOMY WITH RADIOACTIVE SEED AND SENTINEL LYMPH NODE BIOPSY Left 04/27/2020   Procedure: LEFT BREAST LUMPECTOMY WITH RADIOACTIVE SEED AND LEFT AXILLARY SENTINEL LYMPH NODE BIOPSY;  Surgeon: Ebbie Cough, MD;  Location: [facility];  Service: General;  Laterality: Left;   CESAREAN SECTION     x 1   SHOULDER SURGERY Left    SUBACROMIAL DECOMPRESSION Right 08/08/2024   Procedure: DECOMPRESSION, SUBACROMIAL SPACE;  Surgeon: Sharl Selinda Dover, MD;  Location: [facility];  Service: Orthopedics;  Laterality: Right;   Patient Active Problem List   Diagnosis Date Noted   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   Traumatic subarachnoid hemorrhage without loss of consciousness, initial encounter (HCC) 06/06/2024   Periorbital hematoma of right eye 06/06/2024   Fibrosis, breast,  left 08/03/2020   Genetic testing 04/29/2020   Malignant neoplasm of upper-outer quadrant of right breast in female, estrogen receptor positive (HCC) 04/14/2020   Family history of prostate cancer    Family history of lung cancer    Family history of skin cancer    Malignant neoplasm of upper-inner quadrant of left breast in female, estrogen receptor positive (HCC) 04/12/2020    PCP: no PCP  REFERRING PROVIDER: Dayle Moores, PA  REFERRING DIAG: M19.011 (ICD-10-CM) - Primary osteoarthritis, right shoulder   THERAPY DIAG:  No diagnosis found.  Rationale for Evaluation and Treatment: Rehabilitation  ONSET DATE: 08/08/24  SUBJECTIVE:  SUBJECTIVE STATEMENT: Pt states she had R shoulder surgery on 08/08/24; states she was having difficulty using her R shoulder and was having a lot of pain associated with moving her shoulder- this why she had surgery.  Had difficulty finding a PT location to take her insurance so today is her first outpatient PT visit after surgery.  Pt c/o: R shoulder pain, tightness, difficulty moving her R shoulder  Stopped wearing the sling last week    Agg: sleeping, lifting, reaching overhead  Allev: trying to do some movement, using heat/cold pack  Hand dominance: Right  PERTINENT HISTORY: 2021- had L shoulder surgery (same procedure)  Has not returned to driving yet after surgery; her husband brought her today to PT.  PAIN:  Are you having pain? Yes 0-5-7/10; sx reduce quickly when she comes out of irritating positions  PRECAUTIONS: None  RED FLAGS: None   WEIGHT BEARING RESTRICTIONS: No  FALLS:  Has patient fallen in last 6 months? Yes. Number of falls 1- big fall face down; was hospitalized and had TBI ; had to put off her shoulder surgery for awhile  LIVING  ENVIRONMENT: Lives with: lives with their family Lives in: House/apartment Stairs: 3 to enter; not having difficulty  Has following equipment at home: None  OCCUPATION: Not working- she enjoys cross stitch, cooking, sewing (machine and by hand); retired   PLOF: Independent   PATIENT GOALS:Pt's PT goal is to get her motion back in her shoulder    NEXT MD VISIT: 09/16/24  OBJECTIVE:  Note: Objective measures were completed at Evaluation unless otherwise noted.  DIAGNOSTIC FINDINGS:  Pt is currently post-op  PATIENT SURVEYS:  Quick Dash- 68% today  COGNITION: Overall cognitive status: Within functional limits for tasks assessed     SENSATION: WFL  POSTURE: Forward head, rounded forward shoulders   UPPER EXTREMITY ROM:   Passive ROM Right Eval * Left eval  Shoulder flexion 140 155  Shoulder extension    Shoulder abduction NT 172  Shoulder adduction    Shoulder internal rotation 58  85  Shoulder external rotation 60 (at 45 deg abd) 60 (at 45 abd)  Elbow flexion normal normal  Elbow extension normal normal  Wrist flexion    Wrist extension    Wrist ulnar deviation    Wrist radial deviation    Wrist pronation    Wrist supination    (Blank rows = not tested)  HBB: 14 cm difference between L and R HBH: L able to do; R compensated with cervical flexion  UPPER EXTREMITY MMT: deferred to visit #2, will look at this next time   (Assessed on 08/27/24) MMT Right eval Left eval  Shoulder flexion 3+ pain 4  Scapular elevation 5 5  Shoulder abduction 3+ pain 4+  Shoulder adduction    Shoulder internal rotation 3+ pain 4  Shoulder external rotation 4- no pain 4  Middle trapezius 3 4  Lower trapezius    Elbow flexion 4 no pain 5  Elbow extension 5 5  Wrist flexion    Wrist extension    Wrist ulnar deviation    Wrist radial deviation    Wrist pronation    Wrist supination    Grip strength (lbs)    (Blank rows = not tested)  JOINT MOBILITY TESTING:  PAM: GH  jt- Inferior glide, posterior, lateral distraction all hypomobile on R; normal on L   PALPATION:  TTP along posterior incision site  (all 3 incision sites are healing well)  TREATMENT DATE: 09/01/2024  Subjective: Pt has been improving with activities such as brushing hair or hand behind back, but is still limited by motion and pain. Has been compliant with HEP and is progressing appropriately.   Pain: Overhead movement cause pain to spike to 6-7/10 on NPS   Objective: MMT performed (see above) R Shoulder AROM: IR 62, ER 76, abd 150, flex 165 (goniometer used)  Therapeutic Exercises: PROM shoulder flexion- flexion, ER, IR, abd today- 5 minutes  Supine R flexion AROM x 5 Pulleys: flexion and abduction, 5-10 second holds x 10- reviewed for HEP Towel IR behind back: 5-10 second holds x10 - not today  Scapular retraction + ER: 5 second holds x10 (upgraded to GTB today) Standing IR with GTB 2x10   Manual Therapy 3x30s Inferior Shoulder Glides for Flexion   Neuromuscular Re-education  Standing shoulder flexion and abduction to 30 degrees with 2lb weight (2x10 each)   HEP instruction: see below, updated medbridge  PATIENT EDUCATION: Education details: HEP, exercise technique, cold/hot back for pain control Person educated: Patient Education method: Explanation, Demonstration, Tactile cues, Verbal cues, and Handouts Education comprehension: verbalized understanding and returned demonstration  HOME EXERCISE PROGRAM: Access Code: [email or web address] URL: [email or web address] Date: 09/01/2024 Prepared by: Vernell Reges  Exercises - Seated Shoulder Flexion AAROM with Pulley Behind  - 2 x daily - 7 x weekly - 10 reps - 10 hold - Standing Shoulder Internal Rotation Stretch with Towel  - 2 x daily - 7 x weekly - 10 reps - 10 hold - Shoulder External Rotation  and Scapular Retraction with Resistance  - 1 x daily - 7 x weekly - 2 sets - 10 reps - Shoulder Flexion Wall Slide with Towel  - 1 x daily - 7 x weekly - 2 sets - 10 reps - Shoulder Internal Rotation with Resistance  - 1 x daily - 7 x weekly - 2 sets - 10 reps - Standing Shoulder Flexion to 90 Degrees with Dumbbells  - 1 x daily - 7 x weekly - 2 sets - 10 reps  ASSESSMENT:  CLINICAL IMPRESSION: Patient is a 69 y.o. F who was seen today for physical therapy treatment for a course of outpatient PT s/p R shoulder arthroscopic debridement.  She is motivated to participate in tx and should do well with this course of PT.  Pt's HEP was progressed to included RTC and scapulothoracic mm retraining.  Incorporated inferior shoulder glides to promote better movement quality with shoulder flexion. Overall, she tolerated today's session well, and demonstrates continued improvement. Begin incorporating more strengthening and stability exercise as pain decreases.  Should continue to benefit from skilled PT to address impairments listed below and facilitate return to full use of her R UE for ADLs, housework, driving, shopping, and community activities.  OBJECTIVE IMPAIRMENTS: decreased activity tolerance, decreased mobility, decreased ROM, decreased strength, impaired perceived functional ability, impaired UE functional use, and pain.   ACTIVITY LIMITATIONS: carrying, lifting, sleeping, dressing, self feeding, reach over head, and hygiene/grooming  PARTICIPATION LIMITATIONS: meal prep, cleaning, laundry, interpersonal relationship, driving, shopping, and community activity  PERSONAL FACTORS: Past/current experiences and Time since onset of injury/illness/exacerbation are also affecting patient's functional outcome.   REHAB POTENTIAL: Excellent  CLINICAL DECISION MAKING: Stable/uncomplicated  EVALUATION COMPLEXITY: Low   GOALS: Goals reviewed with patient? Yes  SHORT TERM GOALS: Target date:  09/08/24  Improve R shoulder IR 10 degrees to facilitate improved ability to reach behind back for dressing Baseline: 58 deg, HBB thumb to L1 Goal  status: INITIAL    LONG TERM GOALS: Target date: 10/06/24  Improve quick DASH >20 points to facilitate improved ability to perform her house chores and cooking without being limited by R shoulder Baseline: 68% at initial evaluation Goal status: INITIAL  2.  Improve R shoulder flexion AROM/PROM to 155 to facilitate improved ability to reach overhead for items on top of her fridge/tall shelf Baseline: 140 PROM Goal status: INITIAL  3.  Pt will be able to perform 30 min housework with <2/10 R shoulder pain Baseline: 5-7/10 and not doing housework Goal status: INITIAL   Strength goal will be set at visit #2  PLAN:  PT FREQUENCY: 2x/week  PT DURATION: 6 weeks  PLANNED INTERVENTIONS: 97110-Therapeutic exercises, 97530- Therapeutic activity, 97112- Neuromuscular re-education, 97535- Self Care, [id number]- Manual therapy, Patient/Family education, Joint mobilization, Scar mobilization, Cryotherapy, and Moist heat  PLAN FOR NEXT SESSION: ; continue with ROM- AROM and PROM, strengthening  Vernell Reges, PT, DPT, OCS  [phone]   Juliene Levine, Student-PT 09/01/2024, 10:31 AM  Student physical therapist under direct supervision of licensed physical therapist during the entirety of the session.  I have personally read, edited and approve of the note as written.  Vernell Reges, PT, DPT, OCS  "

## 2024-09-03 ENCOUNTER — Ambulatory Visit

## 2024-09-03 DIAGNOSIS — M25511 Pain in right shoulder: Secondary | ICD-10-CM | POA: Diagnosis not present

## 2024-09-03 DIAGNOSIS — R29898 Other symptoms and signs involving the musculoskeletal system: Secondary | ICD-10-CM

## 2024-09-03 DIAGNOSIS — M25611 Stiffness of right shoulder, not elsewhere classified: Secondary | ICD-10-CM

## 2024-09-03 NOTE — Therapy (Signed)
 " OUTPATIENT PHYSICAL THERAPY SHOULDER TREATMENT   Patient Name: Christine Mosley MRN: [id number] DOB:[birth year], 69 y.o., female Today's Date: 09/03/2024  END OF SESSION:  PT End of Session - 09/03/24 1156     Visit Number 4    Number of Visits 13    Date for Recertification  10/06/24    Authorization Type 2x/week x 6 weeks, PN at visit #10 for Medicare    PT Start Time 1156    PT Stop Time 1242    PT Time Calculation (min) 46 min    Activity Tolerance Patient tolerated treatment well    Behavior During Therapy [facility] for tasks assessed/performed          Past Medical History:  Diagnosis Date   Arthritis    Breast cancer (HCC) 2005   right breast-lumpectomy, chemo. 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   Dyspnea    Family history of lung cancer    Family history of prostate cancer    Family history of skin cancer    Personal history of chemotherapy    Personal history of radiation therapy    Past Surgical History:  Procedure Laterality Date   ABDOMINAL HYSTERECTOMY     BLADDER SURGERY     BREAST BIOPSY Left 04/06/2020   BREAST LUMPECTOMY Right 2005   BREAST LUMPECTOMY Left 04/27/2020   BREAST LUMPECTOMY WITH RADIOACTIVE SEED AND SENTINEL LYMPH NODE BIOPSY Left 04/27/2020   Procedure: LEFT BREAST LUMPECTOMY WITH RADIOACTIVE SEED AND LEFT AXILLARY SENTINEL LYMPH NODE BIOPSY;  Surgeon: Ebbie Cough, MD;  Location: [facility];  Service: General;  Laterality: Left;   CESAREAN SECTION     x 1   SHOULDER SURGERY Left    SUBACROMIAL DECOMPRESSION Right 08/08/2024   Procedure: DECOMPRESSION, SUBACROMIAL SPACE;  Surgeon: Sharl Selinda Dover, MD;  Location: [facility];  Service: Orthopedics;  Laterality: Right;   Patient Active Problem List   Diagnosis Date Noted   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   Traumatic subarachnoid hemorrhage without loss of consciousness, initial encounter (HCC) 06/06/2024    Periorbital hematoma of right eye 06/06/2024   Fibrosis, breast, left 08/03/2020   Genetic testing 04/29/2020   Malignant neoplasm of upper-outer quadrant of right breast in female, estrogen receptor positive (HCC) 04/14/2020   Family history of prostate cancer    Family history of lung cancer    Family history of skin cancer    Malignant neoplasm of upper-inner quadrant of left breast in female, estrogen receptor positive (HCC) 04/12/2020    PCP: no PCP  REFERRING PROVIDER: Dayle Moores, PA  REFERRING DIAG: M19.011 (ICD-10-CM) - Primary osteoarthritis, right shoulder   THERAPY DIAG:  Acute pain of right shoulder  Stiffness of right shoulder, not elsewhere classified  Weakness of right shoulder  Rationale for Evaluation and Treatment: Rehabilitation  ONSET DATE: 08/08/24  SUBJECTIVE:  SUBJECTIVE STATEMENT: Pt states she had R shoulder surgery on 08/08/24; states she was having difficulty using her R shoulder and was having a lot of pain associated with moving her shoulder- this why she had surgery.  Had difficulty finding a PT location to take her insurance so today is her first outpatient PT visit after surgery.  Pt c/o: R shoulder pain, tightness, difficulty moving her R shoulder  Stopped wearing the sling last week    Agg: sleeping, lifting, reaching overhead  Allev: trying to do some movement, using heat/cold pack  Hand dominance: Right  PERTINENT HISTORY: 2021- had L shoulder surgery (same procedure)  Has not returned to driving yet after surgery; her husband brought her today to PT.  PAIN:  Are you having pain? Yes 0-5-7/10; sx reduce quickly when she comes out of irritating positions  PRECAUTIONS: None  RED FLAGS: None   WEIGHT BEARING RESTRICTIONS: No  FALLS:  Has patient  fallen in last 6 months? Yes. Number of falls 1- big fall face down; was hospitalized and had TBI ; had to put off her shoulder surgery for awhile  LIVING ENVIRONMENT: Lives with: lives with their family Lives in: House/apartment Stairs: 3 to enter; not having difficulty  Has following equipment at home: None  OCCUPATION: Not working- she enjoys cross stitch, cooking, sewing (machine and by hand); retired   PLOF: Independent   PATIENT GOALS:Pt's PT goal is to get her motion back in her shoulder    NEXT MD VISIT: 09/16/24  OBJECTIVE:  Note: Objective measures were completed at Evaluation unless otherwise noted.  DIAGNOSTIC FINDINGS:  Pt is currently post-op  PATIENT SURVEYS:  Quick Dash- 68% today  COGNITION: Overall cognitive status: Within functional limits for tasks assessed     SENSATION: WFL  POSTURE: Forward head, rounded forward shoulders   UPPER EXTREMITY ROM:   Passive ROM Right Eval * Left eval  Shoulder flexion 140 155  Shoulder extension    Shoulder abduction NT 172  Shoulder adduction    Shoulder internal rotation 58  85  Shoulder external rotation 60 (at 45 deg abd) 60 (at 45 abd)  Elbow flexion normal normal  Elbow extension normal normal  Wrist flexion    Wrist extension    Wrist ulnar deviation    Wrist radial deviation    Wrist pronation    Wrist supination    (Blank rows = not tested)  HBB: 14 cm difference between L and R HBH: L able to do; R compensated with cervical flexion  UPPER EXTREMITY MMT: deferred to visit #2, will look at this next time   (Assessed on 08/27/24) MMT Right eval Left eval  Shoulder flexion 3+ pain 4  Scapular elevation 5 5  Shoulder abduction 3+ pain 4+  Shoulder adduction    Shoulder internal rotation 3+ pain 4  Shoulder external rotation 4- no pain 4  Middle trapezius 3 4  Lower trapezius    Elbow flexion 4 no pain 5  Elbow extension 5 5  Wrist flexion    Wrist extension    Wrist ulnar deviation     Wrist radial deviation    Wrist pronation    Wrist supination    Grip strength (lbs)    (Blank rows = not tested)  JOINT MOBILITY TESTING:  PAM: GH jt- Inferior glide, posterior, lateral distraction all hypomobile on R; normal on L   PALPATION:  TTP along posterior incision site  (all 3 incision sites are healing well)  TREATMENT DATE: 09/03/2024  Subjective: Pt has been improving with activities such as brushing hair or hand behind back, but is still limited by motion and pain. Has been compliant with HEP and is progressing appropriately. Begin focusing on more postural control and shoulder stability in the next week. Pain has overall been lower on the NPS (3-4) and the patient has been able to cook meals and fix her hair in the morning.    Pain: Overhead movement cause pain to spike to 6-7/10 on NPS  Average pain today at top of shoulder 3-4/10 NPS    Objective: MMT performed (see above) R Shoulder AROM: IR 62, ER 76, abd 150, flex 165 (goniometer used)  Therapeutic Exercises: PROM shoulder flexion- flexion, ER, IR, abd today 5 minutes Arm Bike 3 mins forward, 3 mins backward  Supine R flexion AROM x 5 Pulleys: flexion and abduction, 5-10 second holds x 10- reviewed for HEP, attempted wall slides which was too painful today  Towel IR behind back: 5-10 second holds x10 - not today  Scapular retraction + ER: 5 second holds x10 (upgraded to GTB today) - continued with GTB, may upgrade band next visit  Standing IR with GTB 2x10 - not today   Manual Therapy 3x30s Inferior Shoulder Glides for Flexion  (grade 3) 3x30 Distraction into upper ROM for abduction   Neuromuscular Re-education  Standing shoulder flexion and abduction to 50 degrees with 2lb weight (3x8 each)  HEP instruction: see below, updated medbridge  PATIENT EDUCATION: Education details: HEP,  exercise technique, cold/hot back for pain control Person educated: Patient Education method: Explanation, Demonstration, Tactile cues, Verbal cues, and Handouts Education comprehension: verbalized understanding and returned demonstration  HOME EXERCISE PROGRAM: Access Code: [email or web address] URL: [email or web address] Date: 09/01/2024 Prepared by: Vernell Reges  Exercises - Seated Shoulder Flexion AAROM with Pulley Behind  - 2 x daily - 7 x weekly - 10 reps - 10 hold - Standing Shoulder Internal Rotation Stretch with Towel  - 2 x daily - 7 x weekly - 10 reps - 10 hold - Shoulder External Rotation and Scapular Retraction with Resistance  - 1 x daily - 7 x weekly - 2 sets - 10 reps - Shoulder Flexion Wall Slide with Towel  - 1 x daily - 7 x weekly - 2 sets - 10 reps - Shoulder Internal Rotation with Resistance  - 1 x daily - 7 x weekly - 2 sets - 10 reps - Standing Shoulder Flexion to 90 Degrees with Dumbbells  - 1 x daily - 7 x weekly - 2 sets - 10 reps  ASSESSMENT:  CLINICAL IMPRESSION: Patient is a 69 y.o. F who was seen today for physical therapy treatment for a course of outpatient PT s/p R shoulder arthroscopic debridement.  She is motivated to participate in tx and should do well with this course of PT.  Pt's HEP was progressed to included RTC and scapulothoracic mm retraining.  Incorporated inferior shoulder glides to promote better movement quality with shoulder flexion. Overall, she tolerated today's session well, and demonstrates continued improvement with strength and ROM, and muscular re-education. Begin incorporating more strengthening and stability exercise as pain decreases. Pt has been progressing well in between each visit, continue building HEP to include more strengthening and combined functional movements as tx progresses. Should continue to benefit from skilled PT to address impairments listed below and facilitate return to full use of her R UE for ADLs, housework,  driving, shopping, and community activities.  OBJECTIVE IMPAIRMENTS: decreased  activity tolerance, decreased mobility, decreased ROM, decreased strength, impaired perceived functional ability, impaired UE functional use, and pain.   ACTIVITY LIMITATIONS: carrying, lifting, sleeping, dressing, self feeding, reach over head, and hygiene/grooming  PARTICIPATION LIMITATIONS: meal prep, cleaning, laundry, interpersonal relationship, driving, shopping, and community activity  PERSONAL FACTORS: Past/current experiences and Time since onset of injury/illness/exacerbation are also affecting patient's functional outcome.   REHAB POTENTIAL: Excellent  CLINICAL DECISION MAKING: Stable/uncomplicated  EVALUATION COMPLEXITY: Low   GOALS: Goals reviewed with patient? Yes  SHORT TERM GOALS: Target date: 09/08/24  Improve R shoulder IR 10 degrees to facilitate improved ability to reach behind back for dressing Baseline: 58 deg, HBB thumb to L1 Goal status: INITIAL    LONG TERM GOALS: Target date: 10/06/24  Improve quick DASH >20 points to facilitate improved ability to perform her house chores and cooking without being limited by R shoulder Baseline: 68% at initial evaluation Goal status: INITIAL  2.  Improve R shoulder flexion AROM/PROM to 155 to facilitate improved ability to reach overhead for items on top of her fridge/tall shelf Baseline: 140 PROM Goal status: INITIAL  3.  Pt will be able to perform 30 min housework with <2/10 R shoulder pain Baseline: 5-7/10 and not doing housework Goal status: INITIAL   Strength goal will be set at visit #2  PLAN:  PT FREQUENCY: 2x/week  PT DURATION: 6 weeks  PLANNED INTERVENTIONS: 97110-Therapeutic exercises, 97530- Therapeutic activity, 97112- Neuromuscular re-education, 97535- Self Care, [id number]- Manual therapy, Patient/Family education, Joint mobilization, Scar mobilization, Cryotherapy, and Moist heat  PLAN FOR NEXT SESSION: ; continue with  ROM- AROM and PROM, strengthening  Vernell Reges, PT, DPT, OCS    Juliene Levine, Student-PT 09/03/2024, 12:52 PM  Student physical therapist under direct supervision of licensed physical therapist during the entirety of the session.  I have personally read, edited and approve of the note as written.  Vernell Reges, PT, DPT, OCS  "

## 2024-09-08 ENCOUNTER — Ambulatory Visit

## 2024-09-08 DIAGNOSIS — M25511 Pain in right shoulder: Secondary | ICD-10-CM

## 2024-09-08 DIAGNOSIS — M25611 Stiffness of right shoulder, not elsewhere classified: Secondary | ICD-10-CM

## 2024-09-08 DIAGNOSIS — R29898 Other symptoms and signs involving the musculoskeletal system: Secondary | ICD-10-CM

## 2024-09-08 NOTE — Therapy (Signed)
 " OUTPATIENT PHYSICAL THERAPY SHOULDER TREATMENT   Patient Name: Christine Mosley MRN: [id number] DOB:12-13-1955, 69 y.o., female Today's Date: 09/08/2024  END OF SESSION:  PT End of Session - 09/08/24 1049     Visit Number 5    Number of Visits 13    Date for Recertification  10/06/24    Authorization Type 2x/week x 6 weeks, PN at visit #10 for Medicare    PT Start Time 1045    PT Stop Time 1130    PT Time Calculation (min) 45 min    Activity Tolerance Patient tolerated treatment well    Behavior During Therapy [facility] for tasks assessed/performed          Past Medical History:  Diagnosis Date   Arthritis    Breast cancer (HCC) 2005   right breast-lumpectomy, chemo. 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   Dyspnea    Family history of lung cancer    Family history of prostate cancer    Family history of skin cancer    Personal history of chemotherapy    Personal history of radiation therapy    Past Surgical History:  Procedure Laterality Date   ABDOMINAL HYSTERECTOMY     BLADDER SURGERY     BREAST BIOPSY Left 04/06/2020   BREAST LUMPECTOMY Right 2005   BREAST LUMPECTOMY Left 04/27/2020   BREAST LUMPECTOMY WITH RADIOACTIVE SEED AND SENTINEL LYMPH NODE BIOPSY Left 04/27/2020   Procedure: LEFT BREAST LUMPECTOMY WITH RADIOACTIVE SEED AND LEFT AXILLARY SENTINEL LYMPH NODE BIOPSY;  Surgeon: Ebbie Cough, MD;  Location: [facility];  Service: General;  Laterality: Left;   CESAREAN SECTION     x 1   SHOULDER SURGERY Left    SUBACROMIAL DECOMPRESSION Right 08/08/2024   Procedure: DECOMPRESSION, SUBACROMIAL SPACE;  Surgeon: Sharl Selinda Dover, MD;  Location: [facility];  Service: Orthopedics;  Laterality: Right;   Patient Active Problem List   Diagnosis Date Noted   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   Traumatic subarachnoid hemorrhage without loss of consciousness, initial encounter (HCC) 06/06/2024    Periorbital hematoma of right eye 06/06/2024   Fibrosis, breast, left 08/03/2020   Genetic testing 04/29/2020   Malignant neoplasm of upper-outer quadrant of right breast in female, estrogen receptor positive (HCC) 04/14/2020   Family history of prostate cancer    Family history of lung cancer    Family history of skin cancer    Malignant neoplasm of upper-inner quadrant of left breast in female, estrogen receptor positive (HCC) 04/12/2020    PCP: no PCP  REFERRING PROVIDER: Dayle Moores, PA  REFERRING DIAG: M19.011 (ICD-10-CM) - Primary osteoarthritis, right shoulder   THERAPY DIAG:  Acute pain of right shoulder  Stiffness of right shoulder, not elsewhere classified  Weakness of right shoulder  Rationale for Evaluation and Treatment: Rehabilitation  ONSET DATE: 08/08/24  SUBJECTIVE:  SUBJECTIVE STATEMENT: Pt states she had R shoulder surgery on 08/08/24; states she was having difficulty using her R shoulder and was having a lot of pain associated with moving her shoulder- this why she had surgery.  Had difficulty finding a PT location to take her insurance so today is her first outpatient PT visit after surgery.  Pt c/o: R shoulder pain, tightness, difficulty moving her R shoulder  Stopped wearing the sling last week    Agg: sleeping, lifting, reaching overhead  Allev: trying to do some movement, using heat/cold pack  Hand dominance: Right  PERTINENT HISTORY: 2021- had L shoulder surgery (same procedure)  Has not returned to driving yet after surgery; her husband brought her today to PT.  PAIN:  Are you having pain? Yes 0-5-7/10; sx reduce quickly when she comes out of irritating positions  PRECAUTIONS: None  RED FLAGS: None   WEIGHT BEARING RESTRICTIONS: No  FALLS:  Has patient  fallen in last 6 months? Yes. Number of falls 1- big fall face down; was hospitalized and had TBI ; had to put off her shoulder surgery for awhile  LIVING ENVIRONMENT: Lives with: lives with their family Lives in: House/apartment Stairs: 3 to enter; not having difficulty  Has following equipment at home: None  OCCUPATION: Not working- she enjoys cross stitch, cooking, sewing (machine and by hand); retired   PLOF: Independent   PATIENT GOALS:Pt's PT goal is to get her motion back in her shoulder    NEXT MD VISIT: 09/16/24  OBJECTIVE:  Note: Objective measures were completed at Evaluation unless otherwise noted.  DIAGNOSTIC FINDINGS:  Pt is currently post-op  PATIENT SURVEYS:  Quick Dash- 68% today  COGNITION: Overall cognitive status: Within functional limits for tasks assessed     SENSATION: WFL  POSTURE: Forward head, rounded forward shoulders   UPPER EXTREMITY ROM:   Passive ROM Right Eval * Left eval  Shoulder flexion 140 155  Shoulder extension    Shoulder abduction NT 172  Shoulder adduction    Shoulder internal rotation 58  85  Shoulder external rotation 60 (at 45 deg abd) 60 (at 45 abd)  Elbow flexion normal normal  Elbow extension normal normal  Wrist flexion    Wrist extension    Wrist ulnar deviation    Wrist radial deviation    Wrist pronation    Wrist supination    (Blank rows = not tested)  HBB: 14 cm difference between L and R HBH: L able to do; R compensated with cervical flexion  UPPER EXTREMITY MMT: deferred to visit #2, will look at this next time   (Assessed on 08/27/24) MMT Right eval Left eval  Shoulder flexion 3+ pain 4  Scapular elevation 5 5  Shoulder abduction 3+ pain 4+  Shoulder adduction    Shoulder internal rotation 3+ pain 4  Shoulder external rotation 4- no pain 4  Middle trapezius 3 4  Lower trapezius    Elbow flexion 4 no pain 5  Elbow extension 5 5  Wrist flexion    Wrist extension    Wrist ulnar deviation     Wrist radial deviation    Wrist pronation    Wrist supination    Grip strength (lbs)    (Blank rows = not tested)  JOINT MOBILITY TESTING:  PAM: GH jt- Inferior glide, posterior, lateral distraction all hypomobile on R; normal on L   PALPATION:  TTP along posterior incision site  (all 3 incision sites are healing well)  TREATMENT DATE: 09/08/2024  Subjective: Pt reports she has driven herself to church this past Sunday and reported the car ride (especially turning the steering wheel) was much easier. Pt has been cooking and continuing cross-stitching projects at home. Pt has reported completing her HEP regularly and obtaining some dumbbells at home for more strength progression.   Pain: Overhead movement cause pain to spike to 6-7/10 on NPS  Average pain today at top of shoulder 2-3/10 NPS (spikes to 6-7 when performing shoulder extension from a flexed position)   Objective: MMT performed (see above) R Shoulder AROM: IR: 68, flex 152  Therapeutic Exercises: PROM shoulder flexion- flexion, ER, IR, abd today 5 minutes Arm Bike 3 mins forward, 3 mins backward Finger walking up wall to max flexion 8x today  Towel IR behind back: 5-10 second holds x10 - not today  Scapular retraction + ER: 5 second holds x10 (upgraded to GTB today) - continued with GTB, may upgrade band next visit  Standing IR with GTB 2x10   Manual Therapy 3x30s Inferior Shoulder Glides for Flexion  (grade 3) 3x30 Distraction into upper ROM for abduction  5 reps PROM in all planes supine position   Neuromuscular Re-education  Standing shoulder flexion and abduction to 50 degrees with 3lb weight (3x8 each) Body blade for ER/IR motor control, arm at side with elbow bent to 90 3x41min   HEP instruction: see below, updated medbridge  PATIENT EDUCATION: Education details: HEP, exercise  technique, cold/hot back for pain control Person educated: Patient Education method: Explanation, Demonstration, Tactile cues, Verbal cues, and Handouts Education comprehension: verbalized understanding and returned demonstration  HOME EXERCISE PROGRAM: Access Code: [email or web address] URL: [email or web address] Date: 09/01/2024 Prepared by: Vernell Reges  Exercises - Seated Shoulder Flexion AAROM with Pulley Behind  - 2 x daily - 7 x weekly - 10 reps - 10 hold - Standing Shoulder Internal Rotation Stretch with Towel  - 2 x daily - 7 x weekly - 10 reps - 10 hold - Shoulder External Rotation and Scapular Retraction with Resistance  - 1 x daily - 7 x weekly - 2 sets - 10 reps - Shoulder Flexion Wall Slide with Towel  - 1 x daily - 7 x weekly - 2 sets - 10 reps - Shoulder Internal Rotation with Resistance  - 1 x daily - 7 x weekly - 2 sets - 10 reps - Standing Shoulder Flexion to 90 Degrees with Dumbbells  - 1 x daily - 7 x weekly - 2 sets - 10 reps  ASSESSMENT:  CLINICAL IMPRESSION: Patient is a 69 y.o. F who was seen today for physical therapy treatment for a course of outpatient PT s/p R shoulder arthroscopic debridement.  She is motivated to participate in tx and should do well with this course of PT.  Pt's HEP was progressed to included RTC and scapulothoracic mm retraining.  Incorporated inferior shoulder glides to promote better movement quality with shoulder flexion. Overall, she tolerated today's session well, and demonstrates continued improvement with strength and ROM, and muscular re-education. Her shoulder ROM has improved 10 degrees for IR, met STG.  Begin incorporating more strengthening and stability exercise as pain decreases. Pt has been progressing well in between each visit, continue building HEP to include more strengthening and combined functional movements as tx progresses. Should continue to benefit from skilled PT to address impairments listed below and facilitate  return to full use of her R UE for ADLs, housework, driving, shopping, and community activities.  OBJECTIVE IMPAIRMENTS:  decreased activity tolerance, decreased mobility, decreased ROM, decreased strength, impaired perceived functional ability, impaired UE functional use, and pain.   ACTIVITY LIMITATIONS: carrying, lifting, sleeping, dressing, self feeding, reach over head, and hygiene/grooming  PARTICIPATION LIMITATIONS: meal prep, cleaning, laundry, interpersonal relationship, driving, shopping, and community activity  PERSONAL FACTORS: Past/current experiences and Time since onset of injury/illness/exacerbation are also affecting patient's functional outcome.   REHAB POTENTIAL: Excellent  CLINICAL DECISION MAKING: Stable/uncomplicated  EVALUATION COMPLEXITY: Low   GOALS: Goals reviewed with patient? Yes  SHORT TERM GOALS: Target date: 09/08/24  Improve R shoulder IR 10 degrees to facilitate improved ability to reach behind back for dressing Baseline: 58 deg, HBB thumb to L1; 09/08/24: 68 Goal status: Met   LONG TERM GOALS: Target date: 10/06/24  Improve quick DASH >20 points to facilitate improved ability to perform her house chores and cooking without being limited by R shoulder Baseline: 68% at initial evaluation Goal status: INITIAL  2.  Improve R shoulder flexion AROM/PROM to 155 to facilitate improved ability to reach overhead for items on top of her fridge/tall shelf Baseline: 140 PROM; 09/08/24 AROM 152 Goal status: INITIAL  3.  Pt will be able to perform 30 min housework with <2/10 R shoulder pain Baseline: 5-7/10 and not doing housework Goal status: INITIAL   Strength goal will be set at visit #2  PLAN:  PT FREQUENCY: 2x/week  PT DURATION: 6 weeks  PLANNED INTERVENTIONS: 97110-Therapeutic exercises, 97530- Therapeutic activity, 97112- Neuromuscular re-education, 97535- Self Care, [id number]- Manual therapy, Patient/Family education, Joint mobilization, Scar  mobilization, Cryotherapy, and Moist heat  PLAN FOR NEXT SESSION: ; continue with ROM- AROM and PROM, strengthening  Vernell Reges, PT, DPT, OCS    Juliene Levine, Student-PT 09/08/2024, 11:41 AM  Student physical therapist under direct supervision of licensed physical therapist during the entirety of the session.  I have personally read, edited and approve of the note as written.  Vernell Reges, PT, DPT, OCS  "

## 2024-09-10 ENCOUNTER — Ambulatory Visit

## 2024-09-10 DIAGNOSIS — R29898 Other symptoms and signs involving the musculoskeletal system: Secondary | ICD-10-CM

## 2024-09-10 DIAGNOSIS — M25511 Pain in right shoulder: Secondary | ICD-10-CM | POA: Diagnosis not present

## 2024-09-10 DIAGNOSIS — M25611 Stiffness of right shoulder, not elsewhere classified: Secondary | ICD-10-CM

## 2024-09-10 NOTE — Therapy (Signed)
 " OUTPATIENT PHYSICAL THERAPY SHOULDER TREATMENT   Patient Name: Christine Mosley MRN: [id number] DOB:03-06-1956, 69 y.o., female Today's Date: 09/10/2024  END OF SESSION:  PT End of Session - 09/10/24 1034     Visit Number 6    Number of Visits 13    Date for Recertification  10/06/24    Authorization Type 2x/week x 6 weeks, PN at visit #10 for Medicare    PT Start Time 1034    PT Stop Time 1122    PT Time Calculation (min) 48 min    Activity Tolerance Patient tolerated treatment well    Behavior During Therapy [facility] for tasks assessed/performed          Past Medical History:  Diagnosis Date   Arthritis    Breast cancer (HCC) 2005   right breast-lumpectomy, chemo. 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   Dyspnea    Family history of lung cancer    Family history of prostate cancer    Family history of skin cancer    Personal history of chemotherapy    Personal history of radiation therapy    Past Surgical History:  Procedure Laterality Date   ABDOMINAL HYSTERECTOMY     BLADDER SURGERY     BREAST BIOPSY Left 04/06/2020   BREAST LUMPECTOMY Right 2005   BREAST LUMPECTOMY Left 04/27/2020   BREAST LUMPECTOMY WITH RADIOACTIVE SEED AND SENTINEL LYMPH NODE BIOPSY Left 04/27/2020   Procedure: LEFT BREAST LUMPECTOMY WITH RADIOACTIVE SEED AND LEFT AXILLARY SENTINEL LYMPH NODE BIOPSY;  Surgeon: Ebbie Cough, MD;  Location: [facility];  Service: General;  Laterality: Left;   CESAREAN SECTION     x 1   SHOULDER SURGERY Left    SUBACROMIAL DECOMPRESSION Right 08/08/2024   Procedure: DECOMPRESSION, SUBACROMIAL SPACE;  Surgeon: Sharl Selinda Dover, MD;  Location: [facility];  Service: Orthopedics;  Laterality: Right;   Patient Active Problem List   Diagnosis Date Noted   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   Traumatic subarachnoid hemorrhage without loss of consciousness, initial encounter (HCC) 06/06/2024    Periorbital hematoma of right eye 06/06/2024   Fibrosis, breast, left 08/03/2020   Genetic testing 04/29/2020   Malignant neoplasm of upper-outer quadrant of right breast in female, estrogen receptor positive (HCC) 04/14/2020   Family history of prostate cancer    Family history of lung cancer    Family history of skin cancer    Malignant neoplasm of upper-inner quadrant of left breast in female, estrogen receptor positive (HCC) 04/12/2020    PCP: no PCP  REFERRING PROVIDER: Dayle Moores, PA  REFERRING DIAG: M19.011 (ICD-10-CM) - Primary osteoarthritis, right shoulder   THERAPY DIAG:  Acute pain of right shoulder  Stiffness of right shoulder, not elsewhere classified  Weakness of right shoulder  Rationale for Evaluation and Treatment: Rehabilitation  ONSET DATE: 08/08/24  SUBJECTIVE:  SUBJECTIVE STATEMENT: Pt states she had R shoulder surgery on 08/08/24; states she was having difficulty using her R shoulder and was having a lot of pain associated with moving her shoulder- this why she had surgery.  Had difficulty finding a PT location to take her insurance so today is her first outpatient PT visit after surgery.  Pt c/o: R shoulder pain, tightness, difficulty moving her R shoulder  Stopped wearing the sling last week    Agg: sleeping, lifting, reaching overhead  Allev: trying to do some movement, using heat/cold pack  Hand dominance: Right  PERTINENT HISTORY: 2021- had L shoulder surgery (same procedure)  Has not returned to driving yet after surgery; her husband brought her today to PT.  PAIN:  Are you having pain? Yes 0-5-7/10; sx reduce quickly when she comes out of irritating positions  PRECAUTIONS: None  RED FLAGS: None   WEIGHT BEARING RESTRICTIONS: No  FALLS:  Has patient  fallen in last 6 months? Yes. Number of falls 1- big fall face down; was hospitalized and had TBI ; had to put off her shoulder surgery for awhile  LIVING ENVIRONMENT: Lives with: lives with their family Lives in: House/apartment Stairs: 3 to enter; not having difficulty  Has following equipment at home: None  OCCUPATION: Not working- she enjoys cross stitch, cooking, sewing (machine and by hand); retired   PLOF: Independent   PATIENT GOALS:Pt's PT goal is to get her motion back in her shoulder    NEXT MD VISIT: 09/16/24  OBJECTIVE:  Note: Objective measures were completed at Evaluation unless otherwise noted.  DIAGNOSTIC FINDINGS:  Pt is currently post-op  PATIENT SURVEYS:  Quick Dash- 68% today  COGNITION: Overall cognitive status: Within functional limits for tasks assessed     SENSATION: WFL  POSTURE: Forward head, rounded forward shoulders   UPPER EXTREMITY ROM:   Passive ROM Right Eval * Left eval  Shoulder flexion 140 155  Shoulder extension    Shoulder abduction NT 172  Shoulder adduction    Shoulder internal rotation 58  85  Shoulder external rotation 60 (at 45 deg abd) 60 (at 45 abd)  Elbow flexion normal normal  Elbow extension normal normal  Wrist flexion    Wrist extension    Wrist ulnar deviation    Wrist radial deviation    Wrist pronation    Wrist supination    (Blank rows = not tested)  HBB: 14 cm difference between L and R HBH: L able to do; R compensated with cervical flexion  UPPER EXTREMITY MMT: deferred to visit #2, will look at this next time   (Assessed on 08/27/24) MMT Right eval Left eval  Shoulder flexion 3+ pain 4  Scapular elevation 5 5  Shoulder abduction 3+ pain 4+  Shoulder adduction    Shoulder internal rotation 3+ pain 4  Shoulder external rotation 4- no pain 4  Middle trapezius 3 4  Lower trapezius    Elbow flexion 4 no pain 5  Elbow extension 5 5  Wrist flexion    Wrist extension    Wrist ulnar deviation     Wrist radial deviation    Wrist pronation    Wrist supination    Grip strength (lbs)    (Blank rows = not tested)  JOINT MOBILITY TESTING:  PAM: GH jt- Inferior glide, posterior, lateral distraction all hypomobile on R; normal on L   PALPATION:  TTP along posterior incision site  (all 3 incision sites are healing well)  TREATMENT DATE: 09/10/2024  Subjective: Pt states she has been having regular sharp pain around 6am in the morning at the top of her right shoulder and down the back of her arm. Has been completing HEP regularly and is feeling stronger in general, and with more range of motion. Pt is looking forward to seeing her referring provider next week to go over progress and POC.   Pain: Overhead movement cause pain to spike to 6-7/10 on NPS  Average pain today at top of shoulder 2-3/10 NPS (spikes to 6-7 when performing shoulder extension from a flexed position)   Objective: MMT performed (see above) R Shoulder AROM: IR: 68, flex 152  Therapeutic Exercises: PROM shoulder flexion- flexion, ER, IR, abd today 6 minutes Arm Bike 3 mins forward, 3 mins backward Finger walking up wall to max flexion 8x today - achieved one more step(marked on wall) Towel IR behind back: 5-10 second holds x10 - not today  Standing rows with GTB 2x10, focused on scapular retraction and uright posture Standing straight-arm shoulder extension, 2x10 GTB - focused on upright posture  Standing IR/ER with GTB 2x10 each side with HEP instruction   Manual Therapy 3x30s Inferior Shoulder Glides for Flexion  (grade 3) 3x30 Distraction into upper ROM for abduction  5 reps PROM in all planes supine position   Neuromuscular Re-education  Standing shoulder flexion and abduction to 50 degrees with 4lb weight (3x8 each) Magnet fishing for 5 minutes to facilitate motor control and  functional reach with UE  HEP instruction: see below, updated medbridge  PATIENT EDUCATION: Education details: HEP, exercise technique, cold/hot back for pain control Person educated: Patient Education method: Explanation, Demonstration, Tactile cues, Verbal cues, and Handouts Education comprehension: verbalized understanding and returned demonstration  HOME EXERCISE PROGRAM: Access Code: [email or web address] URL: [email or web address] Date: 09/10/2024 Prepared by: Vernell Reges  Exercises - Seated Shoulder Flexion AAROM with Pulley Behind  - 2 x daily - 7 x weekly - 10 reps - 10 hold - Standing Shoulder Internal Rotation Stretch with Towel  - 2 x daily - 7 x weekly - 10 reps - 10 hold - Shoulder External Rotation and Scapular Retraction with Resistance  - 1 x daily - 7 x weekly - 2 sets - 10 reps - Shoulder Flexion Wall Slide with Towel  - 1 x daily - 7 x weekly - 2 sets - 10 reps - Shoulder Internal Rotation with Resistance  - 1 x daily - 7 x weekly - 2 sets - 10 reps - Standing Shoulder Flexion to 90 Degrees with Dumbbells  - 1 x daily - 7 x weekly - 2 sets - 10 reps - Standing Shoulder Row with Anchored Resistance  - 1 x daily - 7 x weekly - 3 sets - 10 reps - Shoulder extension with resistance - Neutral  - 1 x daily - 7 x weekly - 3 sets - 10 reps  ASSESSMENT:  CLINICAL IMPRESSION: Patient is a 69 y.o. F who was seen today for physical therapy treatment for a course of outpatient PT s/p R shoulder arthroscopic debridement.  She is motivated to participate in tx and should do well with this course of PT.  Pt's HEP was progressed to included RTC and scapulothoracic mm retraining.  Incorporated inferior shoulder glides to promote better movement quality with shoulder flexion. Her shoulder ROM has improved 10 degrees for IR, met STG.  Begin incorporating more strengthening and stability exercise as pain decreases. Pt has been progressing well in between each  visit, continue building  HEP to include more strengthening and combined functional movements as tx progresses. Should continue to benefit from skilled PT to address impairments listed below and facilitate return to full use of her R UE for ADLs, housework, driving, shopping, and community activities.  OBJECTIVE IMPAIRMENTS: decreased activity tolerance, decreased mobility, decreased ROM, decreased strength, impaired perceived functional ability, impaired UE functional use, and pain.   ACTIVITY LIMITATIONS: carrying, lifting, sleeping, dressing, self feeding, reach over head, and hygiene/grooming  PARTICIPATION LIMITATIONS: meal prep, cleaning, laundry, interpersonal relationship, driving, shopping, and community activity  PERSONAL FACTORS: Past/current experiences and Time since onset of injury/illness/exacerbation are also affecting patient's functional outcome.   REHAB POTENTIAL: Excellent  CLINICAL DECISION MAKING: Stable/uncomplicated  EVALUATION COMPLEXITY: Low   GOALS: Goals reviewed with patient? Yes  SHORT TERM GOALS: Target date: 09/08/24  Improve R shoulder IR 10 degrees to facilitate improved ability to reach behind back for dressing Baseline: 58 deg, HBB thumb to L1; 09/08/24: 68 Goal status: Met   LONG TERM GOALS: Target date: 10/06/24  Improve quick DASH >20 points to facilitate improved ability to perform her house chores and cooking without being limited by R shoulder Baseline: 68% at initial evaluation Goal status: INITIAL  2.  Improve R shoulder flexion AROM/PROM to 155 to facilitate improved ability to reach overhead for items on top of her fridge/tall shelf Baseline: 140 PROM; 09/08/24 AROM 152 Goal status: INITIAL  3.  Pt will be able to perform 30 min housework with <2/10 R shoulder pain Baseline: 5-7/10 and not doing housework Goal status: INITIAL   Strength goal will be set at visit #2  PLAN:  PT FREQUENCY: 2x/week  PT DURATION: 6 weeks  PLANNED INTERVENTIONS:  97110-Therapeutic exercises, 97530- Therapeutic activity, 97112- Neuromuscular re-education, 97535- Self Care, [id number]- Manual therapy, Patient/Family education, Joint mobilization, Scar mobilization, Cryotherapy, and Moist heat  PLAN FOR NEXT SESSION: ; continue with ROM- AROM and PROM, strengthening. Ask about HEP progression from over the weekend when pt returns Monday   Vernell Reges, PT, DPT, OCS    Juliene Levine, Student-PT 09/10/2024, 11:51 AM  Student physical therapist under direct supervision of licensed physical therapist during the entirety of the session.  I have personally read, edited and approve of the note as written.  Vernell Reges, PT, DPT, OCS  "

## 2024-09-15 ENCOUNTER — Ambulatory Visit

## 2024-09-17 ENCOUNTER — Ambulatory Visit

## 2024-09-17 DIAGNOSIS — M25511 Pain in right shoulder: Secondary | ICD-10-CM | POA: Diagnosis not present

## 2024-09-17 DIAGNOSIS — M25611 Stiffness of right shoulder, not elsewhere classified: Secondary | ICD-10-CM

## 2024-09-17 DIAGNOSIS — R29898 Other symptoms and signs involving the musculoskeletal system: Secondary | ICD-10-CM

## 2024-09-17 NOTE — Therapy (Addendum)
 " OUTPATIENT PHYSICAL THERAPY SHOULDER TREATMENT   Patient Name: Christine Mosley MRN: [id number] DOB:[birth year], 69 y.o., female Today's Date: 09/17/2024  END OF SESSION:  PT End of Session - 09/17/24 1117     Visit Number 7    Number of Visits 13    Date for Recertification  10/06/24    Authorization Type 2x/week x 6 weeks, PN at visit #10 for Medicare    PT Start Time 1117    PT Stop Time 1209    PT Time Calculation (min) 52 min    Activity Tolerance Patient tolerated treatment well    Behavior During Therapy [facility] for tasks assessed/performed          Past Medical History:  Diagnosis Date   Arthritis    Breast cancer (HCC) 2005   right breast-lumpectomy, chemo. 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   Dyspnea    Family history of lung cancer    Family history of prostate cancer    Family history of skin cancer    Personal history of chemotherapy    Personal history of radiation therapy    Past Surgical History:  Procedure Laterality Date   ABDOMINAL HYSTERECTOMY     BLADDER SURGERY     BREAST BIOPSY Left 04/06/2020   BREAST LUMPECTOMY Right 2005   BREAST LUMPECTOMY Left 04/27/2020   BREAST LUMPECTOMY WITH RADIOACTIVE SEED AND SENTINEL LYMPH NODE BIOPSY Left 04/27/2020   Procedure: LEFT BREAST LUMPECTOMY WITH RADIOACTIVE SEED AND LEFT AXILLARY SENTINEL LYMPH NODE BIOPSY;  Surgeon: Ebbie Cough, MD;  Location: [facility];  Service: General;  Laterality: Left;   CESAREAN SECTION     x 1   SHOULDER SURGERY Left    SUBACROMIAL DECOMPRESSION Right 08/08/2024   Procedure: DECOMPRESSION, SUBACROMIAL SPACE;  Surgeon: Sharl Selinda Dover, MD;  Location: [facility];  Service: Orthopedics;  Laterality: Right;   Patient Active Problem List   Diagnosis Date Noted   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   Traumatic subarachnoid hemorrhage without loss of consciousness, initial encounter (HCC) 06/06/2024    Periorbital hematoma of right eye 06/06/2024   Fibrosis, breast, left 08/03/2020   Genetic testing 04/29/2020   Malignant neoplasm of upper-outer quadrant of right breast in female, estrogen receptor positive (HCC) 04/14/2020   Family history of prostate cancer    Family history of lung cancer    Family history of skin cancer    Malignant neoplasm of upper-inner quadrant of left breast in female, estrogen receptor positive (HCC) 04/12/2020    PCP: no PCP  REFERRING PROVIDER: Dayle Moores, PA  REFERRING DIAG: M19.011 (ICD-10-CM) - Primary osteoarthritis, right shoulder   THERAPY DIAG:  Acute pain of right shoulder  Stiffness of right shoulder, not elsewhere classified  Weakness of right shoulder  Rationale for Evaluation and Treatment: Rehabilitation  ONSET DATE: 08/08/24  SUBJECTIVE:  SUBJECTIVE STATEMENT: Pt states she had R shoulder surgery on 08/08/24; states she was having difficulty using her R shoulder and was having a lot of pain associated with moving her shoulder- this why she had surgery.  Had difficulty finding a PT location to take her insurance so today is her first outpatient PT visit after surgery.  Pt c/o: R shoulder pain, tightness, difficulty moving her R shoulder  Stopped wearing the sling last week    Agg: sleeping, lifting, reaching overhead  Allev: trying to do some movement, using heat/cold pack  Hand dominance: Right  PERTINENT HISTORY: 2021- had L shoulder surgery (same procedure)  Has not returned to driving yet after surgery; her husband brought her today to PT.  PAIN:  Are you having pain? Yes 0-5-7/10; sx reduce quickly when she comes out of irritating positions  PRECAUTIONS: None  RED FLAGS: None   WEIGHT BEARING RESTRICTIONS: No  FALLS:  Has patient  fallen in last 6 months? Yes. Number of falls 1- big fall face down; was hospitalized and had TBI ; had to put off her shoulder surgery for awhile  LIVING ENVIRONMENT: Lives with: lives with their family Lives in: House/apartment Stairs: 3 to enter; not having difficulty  Has following equipment at home: None  OCCUPATION: Not working- she enjoys cross stitch, cooking, sewing (machine and by hand); retired   PLOF: Independent   PATIENT GOALS:Pt's PT goal is to get her motion back in her shoulder    NEXT MD VISIT: 09/16/24  OBJECTIVE:  Note: Objective measures were completed at Evaluation unless otherwise noted.  DIAGNOSTIC FINDINGS:  Pt is currently post-op  PATIENT SURVEYS:  Quick Dash- 68% today  COGNITION: Overall cognitive status: Within functional limits for tasks assessed     SENSATION: WFL  POSTURE: Forward head, rounded forward shoulders   UPPER EXTREMITY ROM:   Passive ROM Right Eval * Left eval  Shoulder flexion 140 155  Shoulder extension    Shoulder abduction NT 172  Shoulder adduction    Shoulder internal rotation 58  85  Shoulder external rotation 60 (at 45 deg abd) 60 (at 45 abd)  Elbow flexion normal normal  Elbow extension normal normal  Wrist flexion    Wrist extension    Wrist ulnar deviation    Wrist radial deviation    Wrist pronation    Wrist supination    (Blank rows = not tested)  HBB: 14 cm difference between L and R HBH: L able to do; R compensated with cervical flexion  UPPER EXTREMITY MMT: deferred to visit #2, will look at this next time   (Assessed on 08/27/24) MMT Right eval Left eval  Shoulder flexion 3+ pain 4  Scapular elevation 5 5  Shoulder abduction 3+ pain 4+  Shoulder adduction    Shoulder internal rotation 3+ pain 4  Shoulder external rotation 4- no pain 4  Middle trapezius 3 4  Lower trapezius    Elbow flexion 4 no pain 5  Elbow extension 5 5  Wrist flexion    Wrist extension    Wrist ulnar deviation     Wrist radial deviation    Wrist pronation    Wrist supination    Grip strength (lbs)    (Blank rows = not tested)  JOINT MOBILITY TESTING:  PAM: GH jt- Inferior glide, posterior, lateral distraction all hypomobile on R; normal on L   PALPATION:  TTP along posterior incision site  (all 3 incision sites are healing well)  TREATMENT DATE: 09/17/2024  Subjective: Pt states she is feeling weaker today and the front of her right arm was hurting throughout the night, with occasional pain in her left arm - perhaps from mainly using her left arm in her ADLs. Patient has experienced more pain in the morning than at night and has been keeping up with her HEP daily.   Pain: Overhead movement cause pain to spike to 6-7/10 on NPS  Average pain today at top of shoulder 2-3/10 NPS (spikes to 6-7 when performing shoulder extension from a flexed position)   Objective: MMT performed (see above) R Shoulder AROM: IR: 68, flex 152  Therapeutic Exercises: PROM shoulder flexion- flexion, ER, IR, abd today 6 minutes Arm Bike 3 mins forward, 3 mins backward Finger walking up wall to max flexion 4x today  Towel IR behind back: 5-10 second holds x10 - not today  Standing rows with GTB 2x10, focused on scapular retraction and uright posture Standing straight-arm shoulder extension, 2x10 GTB - focused on upright posture  Standing IR/ER with GTB 2x10 each side with HEP instruction - not today   Manual Therapy 3x30s Inferior Shoulder Glides for Flexion  (grade 3) 3x30 Distraction into upper ROM for abduction  5 reps PROM in all planes supine position   Neuromuscular Re-education  Standing shoulder flexion and abduction to 50 degrees with 4lb weight (3x8 each) ( could only tolerate 2lbs today)   HEP instruction: see below, updated medbridge  PATIENT EDUCATION: Education details:  HEP, exercise technique, cold/hot back for pain control Person educated: Patient Education method: Explanation, Demonstration, Tactile cues, Verbal cues, and Handouts Education comprehension: verbalized understanding and returned demonstration  HOME EXERCISE PROGRAM: Access Code: [email or web address] URL: [email or web address] Date: 09/10/2024 Prepared by: Vernell Reges  Exercises - Seated Shoulder Flexion AAROM with Pulley Behind  - 2 x daily - 7 x weekly - 10 reps - 10 hold - Standing Shoulder Internal Rotation Stretch with Towel  - 2 x daily - 7 x weekly - 10 reps - 10 hold - Shoulder External Rotation and Scapular Retraction with Resistance  - 1 x daily - 7 x weekly - 2 sets - 10 reps - Shoulder Flexion Wall Slide with Towel  - 1 x daily - 7 x weekly - 2 sets - 10 reps - Shoulder Internal Rotation with Resistance  - 1 x daily - 7 x weekly - 2 sets - 10 reps - Standing Shoulder Flexion to 90 Degrees with Dumbbells  - 1 x daily - 7 x weekly - 2 sets - 10 reps - Standing Shoulder Row with Anchored Resistance  - 1 x daily - 7 x weekly - 3 sets - 10 reps - Shoulder extension with resistance - Neutral  - 1 x daily - 7 x weekly - 3 sets - 10 reps  ASSESSMENT:  CLINICAL IMPRESSION: Patient is a 69 y.o. F who was seen today for physical therapy treatment for a course of outpatient PT s/p R shoulder arthroscopic debridement.  She is motivated to participate in tx and should do well with this course of PT.  Pt's HEP was progressed to included RTC and scapulothoracic mm retraining.  Incorporated inferior shoulder glides to promote better movement quality with shoulder flexion. Her shoulder ROM has improved 10 degrees for IR, met STG.  Begin incorporating more strengthening and stability exercise as pain decreases. Pt has been progressing well in between each visit, continue building HEP to include more strengthening and combined functional movements as tx progresses.  Should continue to benefit  from skilled PT to address impairments listed below and facilitate return to full use of her R UE for ADLs, housework, driving, shopping, and community activities.  OBJECTIVE IMPAIRMENTS: decreased activity tolerance, decreased mobility, decreased ROM, decreased strength, impaired perceived functional ability, impaired UE functional use, and pain.   ACTIVITY LIMITATIONS: carrying, lifting, sleeping, dressing, self feeding, reach over head, and hygiene/grooming  PARTICIPATION LIMITATIONS: meal prep, cleaning, laundry, interpersonal relationship, driving, shopping, and community activity  PERSONAL FACTORS: Past/current experiences and Time since onset of injury/illness/exacerbation are also affecting patient's functional outcome.   REHAB POTENTIAL: Excellent  CLINICAL DECISION MAKING: Stable/uncomplicated  EVALUATION COMPLEXITY: Low   GOALS: Goals reviewed with patient? Yes  SHORT TERM GOALS: Target date: 09/08/24  Improve R shoulder IR 10 degrees to facilitate improved ability to reach behind back for dressing Baseline: 58 deg, HBB thumb to L1; 09/08/24: 68 Goal status: Met   LONG TERM GOALS: Target date: 10/06/24  Improve quick DASH >20 points to facilitate improved ability to perform her house chores and cooking without being limited by R shoulder Baseline: 68% at initial evaluation Goal status: INITIAL  2.  Improve R shoulder flexion AROM/PROM to 155 to facilitate improved ability to reach overhead for items on top of her fridge/tall shelf Baseline: 140 PROM; 09/08/24 AROM 152 Goal status: INITIAL  3.  Pt will be able to perform 30 min housework with <2/10 R shoulder pain Baseline: 5-7/10 and not doing housework Goal status: INITIAL   Strength goal will be set at visit #2  PLAN:  PT FREQUENCY: 2x/week  PT DURATION: 6 weeks  PLANNED INTERVENTIONS: 97110-Therapeutic exercises, 97530- Therapeutic activity, 97112- Neuromuscular re-education, 97535- Self Care, [id number]- Manual  therapy, Patient/Family education, Joint mobilization, Scar mobilization, Cryotherapy, and Moist heat  PLAN FOR NEXT SESSION: ; continue with ROM- AROM and PROM, strengthening. Ask about HEP progression from over the weekend when pt returns Monday   Vernell Reges, PT, DPT, OCS    Juliene Levine, Student-PT 09/17/2024, 12:17 PM  Student physical therapist under direct supervision of licensed physical therapist during the entirety of the session.  I have personally read, edited and approve of the note as written.  Vernell Reges, PT, DPT, OCS  "

## 2024-09-22 ENCOUNTER — Ambulatory Visit

## 2024-09-24 ENCOUNTER — Ambulatory Visit

## 2024-09-24 DIAGNOSIS — M25611 Stiffness of right shoulder, not elsewhere classified: Secondary | ICD-10-CM

## 2024-09-24 DIAGNOSIS — M25511 Pain in right shoulder: Secondary | ICD-10-CM

## 2024-09-24 DIAGNOSIS — R29898 Other symptoms and signs involving the musculoskeletal system: Secondary | ICD-10-CM

## 2024-09-24 NOTE — Therapy (Signed)
 " OUTPATIENT PHYSICAL THERAPY SHOULDER TREATMENT   Patient Name: Christine Mosley MRN: [id number] DOB:1955/09/25, 69 y.o., female Today's Date: 09/24/2024  END OF SESSION:  PT End of Session - 09/24/24 0942     Visit Number 8    Number of Visits 13    Date for Recertification  10/06/24    Authorization Type 2x/week x 6 weeks, PN at visit #10 for Medicare    PT Start Time 0945    Activity Tolerance Patient tolerated treatment well    Behavior During Therapy [facility] for tasks assessed/performed          Past Medical History:  Diagnosis Date   Arthritis    Breast cancer (HCC) 2005   right breast-lumpectomy, chemo. 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   Dyspnea    Family history of lung cancer    Family history of prostate cancer    Family history of skin cancer    Personal history of chemotherapy    Personal history of radiation therapy    Past Surgical History:  Procedure Laterality Date   ABDOMINAL HYSTERECTOMY     BLADDER SURGERY     BREAST BIOPSY Left 04/06/2020   BREAST LUMPECTOMY Right 2005   BREAST LUMPECTOMY Left 04/27/2020   BREAST LUMPECTOMY WITH RADIOACTIVE SEED AND SENTINEL LYMPH NODE BIOPSY Left 04/27/2020   Procedure: LEFT BREAST LUMPECTOMY WITH RADIOACTIVE SEED AND LEFT AXILLARY SENTINEL LYMPH NODE BIOPSY;  Surgeon: Ebbie Cough, MD;  Location: [facility];  Service: General;  Laterality: Left;   CESAREAN SECTION     x 1   SHOULDER SURGERY Left    SUBACROMIAL DECOMPRESSION Right 08/08/2024   Procedure: DECOMPRESSION, SUBACROMIAL SPACE;  Surgeon: Sharl Selinda Dover, MD;  Location: [facility];  Service: Orthopedics;  Laterality: Right;   Patient Active Problem List   Diagnosis Date Noted   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   Traumatic subarachnoid hemorrhage without loss of consciousness, initial encounter (HCC) 06/06/2024   Periorbital hematoma of right eye 06/06/2024   Fibrosis, breast,  left 08/03/2020   Genetic testing 04/29/2020   Malignant neoplasm of upper-outer quadrant of right breast in female, estrogen receptor positive (HCC) 04/14/2020   Family history of prostate cancer    Family history of lung cancer    Family history of skin cancer    Malignant neoplasm of upper-inner quadrant of left breast in female, estrogen receptor positive (HCC) 04/12/2020    PCP: no PCP  REFERRING PROVIDER: Dayle Moores, PA  REFERRING DIAG: M19.011 (ICD-10-CM) - Primary osteoarthritis, right shoulder   THERAPY DIAG:  Acute pain of right shoulder  Stiffness of right shoulder, not elsewhere classified  Weakness of right shoulder  Rationale for Evaluation and Treatment: Rehabilitation  ONSET DATE: 08/08/24  SUBJECTIVE:  SUBJECTIVE STATEMENT: Pt states she had R shoulder surgery on 08/08/24; states she was having difficulty using her R shoulder and was having a lot of pain associated with moving her shoulder- this why she had surgery.  Had difficulty finding a PT location to take her insurance so today is her first outpatient PT visit after surgery.  Pt c/o: R shoulder pain, tightness, difficulty moving her R shoulder  Stopped wearing the sling last week    Agg: sleeping, lifting, reaching overhead  Allev: trying to do some movement, using heat/cold pack  Hand dominance: Right  PERTINENT HISTORY: 2021- had L shoulder surgery (same procedure)  Has not returned to driving yet after surgery; her husband brought her today to PT.  PAIN:  Are you having pain? Yes 0-5-7/10; sx reduce quickly when she comes out of irritating positions  PRECAUTIONS: None  RED FLAGS: None   WEIGHT BEARING RESTRICTIONS: No  FALLS:  Has patient fallen in last 6 months? Yes. Number of falls 1- big fall face  down; was hospitalized and had TBI ; had to put off her shoulder surgery for awhile  LIVING ENVIRONMENT: Lives with: lives with their family Lives in: House/apartment Stairs: 3 to enter; not having difficulty  Has following equipment at home: None  OCCUPATION: Not working- she enjoys cross stitch, cooking, sewing (machine and by hand); retired   PLOF: Independent   PATIENT GOALS:Pt's PT goal is to get her motion back in her shoulder    NEXT MD VISIT: 09/16/24  OBJECTIVE:  Note: Objective measures were completed at Evaluation unless otherwise noted.  DIAGNOSTIC FINDINGS:  Pt is currently post-op  PATIENT SURVEYS:  Quick Dash- 68% today  COGNITION: Overall cognitive status: Within functional limits for tasks assessed     SENSATION: WFL  POSTURE: Forward head, rounded forward shoulders   UPPER EXTREMITY ROM:   Passive ROM Right Eval * Left eval  Shoulder flexion 140 155  Shoulder extension    Shoulder abduction NT 172  Shoulder adduction    Shoulder internal rotation 58  85  Shoulder external rotation 60 (at 45 deg abd) 60 (at 45 abd)  Elbow flexion normal normal  Elbow extension normal normal  Wrist flexion    Wrist extension    Wrist ulnar deviation    Wrist radial deviation    Wrist pronation    Wrist supination    (Blank rows = not tested)  HBB: 14 cm difference between L and R HBH: L able to do; R compensated with cervical flexion  UPPER EXTREMITY MMT: deferred to visit #2, will look at this next time   (Assessed on 08/27/24) MMT Right eval Left eval  Shoulder flexion 3+ pain 4  Scapular elevation 5 5  Shoulder abduction 3+ pain 4+  Shoulder adduction    Shoulder internal rotation 3+ pain 4  Shoulder external rotation 4- no pain 4  Middle trapezius 3 4  Lower trapezius    Elbow flexion 4 no pain 5  Elbow extension 5 5  Wrist flexion    Wrist extension    Wrist ulnar deviation    Wrist radial deviation    Wrist pronation    Wrist supination     Grip strength (lbs)    (Blank rows = not tested)  JOINT MOBILITY TESTING:  PAM: GH jt- Inferior glide, posterior, lateral distraction all hypomobile on R; normal on L   PALPATION:  TTP along posterior incision site  (all 3 incision sites are healing well)  TREATMENT DATE: 09/24/2024  Subjective: Pt reports continued pain in right shoulder, will go to a follow-up with the doctor tomorrow. Pain is worse in the morning and causes some stiffness.   Pain: Overhead movement cause pain to spike to 6-7/10 on NPS  Average pain today at top of shoulder 2-3/10 NPS (spikes to 6-7 when performing shoulder extension from a flexed position)   Objective: MMT performed (see above) R Shoulder AROM: IR: 68, flex 152  Therapeutic Exercises: PROM shoulder flexion- flexion, ER, IR, abd today 6 minutes Arm Bike 4 mins forward, 4 mins backward Finger walking up wall to max flexion 4x today  Towel IR behind back: 5-10 second holds x10 - not today  Standing rows with GTB 2x10, focused on scapular retraction and uright posture Standing straight-arm shoulder extension, 2x10 GTB - focused on upright posture  Standing IR/ER with GTB 2x10 each side with HEP instruction - not today  Standing scapular Ws GTB 2x fatigue   Manual Therapy 3x30s Inferior Shoulder Glides for Flexion  (grade 3) 3x30 Distraction into upper ROM for abduction  5 reps PROM in all planes supine position   Neuromuscular Re-education  Standing shoulder flexion and abduction to 50 degrees with 4lb weight (3x8 each) ( could only tolerate 2lbs today)   HEP instruction: see below, updated medbridge  PATIENT EDUCATION: Education details: HEP, exercise technique, cold/hot back for pain control Person educated: Patient Education method: Explanation, Demonstration, Tactile cues, Verbal cues, and Handouts Education  comprehension: verbalized understanding and returned demonstration  HOME EXERCISE PROGRAM: Access Code: [email or web address] URL: [email or web address] Date: 09/10/2024 Prepared by: Vernell Reges  Exercises - Seated Shoulder Flexion AAROM with Pulley Behind  - 2 x daily - 7 x weekly - 10 reps - 10 hold - Standing Shoulder Internal Rotation Stretch with Towel  - 2 x daily - 7 x weekly - 10 reps - 10 hold - Shoulder External Rotation and Scapular Retraction with Resistance  - 1 x daily - 7 x weekly - 2 sets - 10 reps - Shoulder Flexion Wall Slide with Towel  - 1 x daily - 7 x weekly - 2 sets - 10 reps - Shoulder Internal Rotation with Resistance  - 1 x daily - 7 x weekly - 2 sets - 10 reps - Standing Shoulder Flexion to 90 Degrees with Dumbbells  - 1 x daily - 7 x weekly - 2 sets - 10 reps - Standing Shoulder Row with Anchored Resistance  - 1 x daily - 7 x weekly - 3 sets - 10 reps - Shoulder extension with resistance - Neutral  - 1 x daily - 7 x weekly - 3 sets - 10 reps  ASSESSMENT:  CLINICAL IMPRESSION: Patient is a 69 y.o. F who was seen today for physical therapy treatment for a course of outpatient PT s/p R shoulder arthroscopic debridement.  She is motivated to participate in tx and should do well with this course of PT.  Pt's HEP was progressed to included RTC and scapulothoracic mm retraining.  Incorporated inferior shoulder glides to promote better movement quality with shoulder flexion. Her shoulder ROM has improved 10 degrees for IR, met STG.  Begin incorporating more strengthening and stability exercise as pain decreases. Pt has been progressing well in between each visit, continue building HEP to include more strengthening and combined functional movements as tx progresses. Should continue to benefit from skilled PT to address impairments listed below and facilitate return to full use of her R UE for ADLs,  housework, driving, shopping, and community activities.  OBJECTIVE  IMPAIRMENTS: decreased activity tolerance, decreased mobility, decreased ROM, decreased strength, impaired perceived functional ability, impaired UE functional use, and pain.   ACTIVITY LIMITATIONS: carrying, lifting, sleeping, dressing, self feeding, reach over head, and hygiene/grooming  PARTICIPATION LIMITATIONS: meal prep, cleaning, laundry, interpersonal relationship, driving, shopping, and community activity  PERSONAL FACTORS: Past/current experiences and Time since onset of injury/illness/exacerbation are also affecting patient's functional outcome.   REHAB POTENTIAL: Excellent  CLINICAL DECISION MAKING: Stable/uncomplicated  EVALUATION COMPLEXITY: Low   GOALS: Goals reviewed with patient? Yes  SHORT TERM GOALS: Target date: 09/08/24  Improve R shoulder IR 10 degrees to facilitate improved ability to reach behind back for dressing Baseline: 58 deg, HBB thumb to L1; 09/08/24: 68 Goal status: Met   LONG TERM GOALS: Target date: 10/06/24  Improve quick DASH >20 points to facilitate improved ability to perform her house chores and cooking without being limited by R shoulder Baseline: 68% at initial evaluation Goal status: INITIAL  2.  Improve R shoulder flexion AROM/PROM to 155 to facilitate improved ability to reach overhead for items on top of her fridge/tall shelf Baseline: 140 PROM; 09/08/24 AROM 152 Goal status: INITIAL  3.  Pt will be able to perform 30 min housework with <2/10 R shoulder pain Baseline: 5-7/10 and not doing housework Goal status: INITIAL   Strength goal will be set at visit #2  PLAN:  PT FREQUENCY: 2x/week  PT DURATION: 6 weeks  PLANNED INTERVENTIONS: 97110-Therapeutic exercises, 97530- Therapeutic activity, 97112- Neuromuscular re-education, 97535- Self Care, [id number]- Manual therapy, Patient/Family education, Joint mobilization, Scar mobilization, Cryotherapy, and Moist heat  PLAN FOR NEXT SESSION: ; continue with ROM- AROM and PROM,  strengthening. Ask about HEP progression from over the weekend when pt returns Monday   Vernell Reges, PT, DPT, OCS    Juliene Levine, Student-PT 09/24/2024, 9:43 AM  Student physical therapist under direct supervision of licensed physical therapist during the entirety of the session.  I have personally read, edited and approve of the note as written.  Vernell Reges, PT, DPT, OCS  "

## 2024-09-26 ENCOUNTER — Ambulatory Visit

## 2024-09-29 ENCOUNTER — Ambulatory Visit

## 2024-10-01 ENCOUNTER — Ambulatory Visit

## 2024-10-06 ENCOUNTER — Ambulatory Visit

## 2024-10-08 ENCOUNTER — Ambulatory Visit

## 2024-10-13 ENCOUNTER — Ambulatory Visit

## 2024-10-15 ENCOUNTER — Ambulatory Visit

## 2024-11-25 ENCOUNTER — Ambulatory Visit: Admitting: Hematology and Oncology
